# Patient Record
Sex: Male | Born: 1935 | Race: White | Hispanic: No | Marital: Married | Smoking: Former smoker
Health system: Southern US, Community
[De-identification: ages and names within clinical notes are randomized; demographics above are authoritative.]

## PROBLEM LIST (undated history)

## (undated) DIAGNOSIS — M25519 Pain in unspecified shoulder: Secondary | ICD-10-CM

## (undated) DIAGNOSIS — H269 Unspecified cataract: Secondary | ICD-10-CM

## (undated) DIAGNOSIS — A419 Sepsis, unspecified organism: Secondary | ICD-10-CM

## (undated) DIAGNOSIS — M549 Dorsalgia, unspecified: Secondary | ICD-10-CM

## (undated) DIAGNOSIS — E785 Hyperlipidemia, unspecified: Secondary | ICD-10-CM

## (undated) DIAGNOSIS — R2689 Other abnormalities of gait and mobility: Secondary | ICD-10-CM

## (undated) DIAGNOSIS — Z9289 Personal history of other medical treatment: Secondary | ICD-10-CM

## (undated) DIAGNOSIS — I639 Cerebral infarction, unspecified: Secondary | ICD-10-CM

## (undated) DIAGNOSIS — K219 Gastro-esophageal reflux disease without esophagitis: Principal | ICD-10-CM

## (undated) DIAGNOSIS — C61 Malignant neoplasm of prostate: Secondary | ICD-10-CM

## (undated) DIAGNOSIS — M1990 Unspecified osteoarthritis, unspecified site: Secondary | ICD-10-CM

## (undated) DIAGNOSIS — I2510 Atherosclerotic heart disease of native coronary artery without angina pectoris: Secondary | ICD-10-CM

## (undated) DIAGNOSIS — N429 Disorder of prostate, unspecified: Secondary | ICD-10-CM

## (undated) DIAGNOSIS — G8929 Other chronic pain: Secondary | ICD-10-CM

## (undated) DIAGNOSIS — I219 Acute myocardial infarction, unspecified: Secondary | ICD-10-CM

## (undated) DIAGNOSIS — I10 Essential (primary) hypertension: Secondary | ICD-10-CM

## (undated) DIAGNOSIS — F419 Anxiety disorder, unspecified: Secondary | ICD-10-CM

## (undated) DIAGNOSIS — Z125 Encounter for screening for malignant neoplasm of prostate: Secondary | ICD-10-CM

## (undated) DIAGNOSIS — IMO0001 Reserved for inherently not codable concepts without codable children: Secondary | ICD-10-CM

## (undated) DIAGNOSIS — C4490 Unspecified malignant neoplasm of skin, unspecified: Secondary | ICD-10-CM

## (undated) HISTORY — DX: Pain in unspecified shoulder: M25.519

## (undated) HISTORY — DX: Essential (primary) hypertension: I10

## (undated) HISTORY — DX: Anxiety disorder, unspecified: F41.9

## (undated) HISTORY — PX: COLON SURGERY: SHX602

## (undated) HISTORY — DX: Unspecified cataract: H26.9

## (undated) HISTORY — DX: Acute myocardial infarction, unspecified: I21.9

## (undated) HISTORY — DX: Other abnormalities of gait and mobility: R26.89

## (undated) HISTORY — DX: Unspecified osteoarthritis, unspecified site: M19.90

## (undated) HISTORY — DX: Unspecified malignant neoplasm of skin, unspecified: C44.90

## (undated) HISTORY — DX: Gastro-esophageal reflux disease without esophagitis: K21.9

## (undated) HISTORY — DX: Cerebral infarction, unspecified: I63.9

## (undated) HISTORY — DX: Reserved for inherently not codable concepts without codable children: IMO0001

## (undated) HISTORY — PX: HAND SURGERY: SHX662

## (undated) HISTORY — DX: Dorsalgia, unspecified: M54.9

## (undated) HISTORY — DX: Other chronic pain: G89.29

## (undated) HISTORY — DX: Disorder of prostate, unspecified: N42.9

## (undated) HISTORY — PX: APPENDECTOMY: SHX54

## (undated) HISTORY — DX: Personal history of other medical treatment: Z92.89

---

## 2006-03-05 HISTORY — PX: VASECTOMY: SHX75

## 2008-05-31 ENCOUNTER — Ambulatory Visit: Payer: Self-pay | Admitting: Surgery

## 2010-02-27 ENCOUNTER — Emergency Department (HOSPITAL_COMMUNITY)
Admission: EM | Admit: 2010-02-27 | Discharge: 2010-02-28 | Payer: Self-pay | Source: Home / Self Care | Admitting: Emergency Medicine

## 2010-05-15 LAB — DIFFERENTIAL
Eosinophils Absolute: 0.2 10*3/uL (ref 0.0–0.7)
Lymphs Abs: 1.5 10*3/uL (ref 0.7–4.0)
Monocytes Absolute: 0.6 10*3/uL (ref 0.1–1.0)
Monocytes Relative: 6 % (ref 3–12)
Neutrophils Relative %: 78 % — ABNORMAL HIGH (ref 43–77)

## 2010-05-15 LAB — BASIC METABOLIC PANEL
CO2: 26 mEq/L (ref 19–32)
Calcium: 8.9 mg/dL (ref 8.4–10.5)
Chloride: 108 mEq/L (ref 96–112)
GFR calc Af Amer: >60 mL/min (ref >60)
Glucose, Bld: 114 mg/dL — ABNORMAL HIGH (ref 70–99)
Potassium: 4 mEq/L (ref 3.5–5.1)
Sodium: 141 mEq/L (ref 135–145)

## 2010-05-15 LAB — CBC
HCT: 44 % (ref 39.0–52.0)
Hemoglobin: 15.1 g/dL (ref 13.0–17.0)
MCH: 31.6 pg (ref 26.0–34.0)
MCV: 92.1 fL (ref 78.0–100.0)
RBC: 4.78 MIL/uL (ref 4.22–5.81)

## 2010-07-18 NOTE — Assessment & Plan Note (Signed)
OFFICE VISIT   Peter [name], Peter [name]  DOB:  Feb 02, 1934                                       05/31/2008  ZOXWR#:[id number]   REASON FOR VISIT:  Leg pain.   REFERRING PHYSICIAN:  Madlyn Frankel. Charlann Boxer, M.D.   HISTORY:  This is a 77 year old gentleman I am seeing at request of Dr.  Charlann Boxer for evaluation of leg pain.  The patient states that he has  recently had a Life Screening exam which showed abnormal ultrasound on  the right leg.  This was further evaluated at [facility] and he was  found to have abnormal study.  ABIs were not calculated due to  incompressibility.  The patient states that he is able to walk for  approximately 10-15 minutes.  After that, when he starts to go up an  incline, his legs begin to feel heavy and tired.  Just a year ago, he  was able to walk approximately 45 minutes.  His symptoms have been  somewhat improved after a 10-pound weight loss since Christmas.  He  denies any cramping with exercise.  These symptoms are improved by  stopping or walking on a flat surface.  He does not have any rest pain.  He does not have any ulceration.   PAST MEDICAL HISTORY:  Significant for hypertension,  hypercholesterolemia, skin cancer.   REVIEW OF SYSTEMS:  GENERAL:  Negative fevers, chills, weight gain,  weight loss.  CARDIAC:  Negative.  PULMONARY:  Negative.  GI:  Negative.  GU:  Positive for prostate problems and burning with urination.  VASCULAR:  Pain in legs with walking.  NEURO:  Negative.  ORTHO:  Positive arthritis, joint pain, muscle pain.  PSYCH:  Negative.  ENT:  Negative.  HEM:  Negative.   FAMILY HISTORY:  Positive for stroke for carotid endarterectomy in his  mother at age 63.   SOCIAL HISTORY:  He is married with 2 children.  He is retired.  History  of smoking, quit in 1970.  He does not drink alcohol on a regular basis.   MEDICATIONS:  Include finasteride, amitriptyline, Cardura, Ativan,  Flonase, Lipitor, aspirin.   ALLERGIES:  PENICILLIN AND LEVAQUIN.   PHYSICAL EXAMINATION:  Blood pressures is 153/91, pulse 71.  General:  Well-appearing, no distress.  HEENT:  Normocephalic, atraumatic.  Pupils  equal.  Sclerae anicteric.  Neck:  Supple.  No JVD.  No carotid bruits.  Cardiovascular:  Regular rate and rhythm.  No murmurs, rubs, or gallops.  Pulmonary:  Lungs are clear bilaterally.  Abdomen:  Soft, nontender.  Extremities are warm and well-perfused.  Has palpable dorsalis pedis and  posterior tibial pulse on the right, and a palpable dorsalis pedis on  the left.  He has no ulceration.  Skin is without rash.  Neuro:  Cranial  nerves II-XII are grossly intact.  He is nonfocal.   Ultrasound was repeated today which reveals a toe pressure of 146 on the  right and 141 on the left.   ASSESSMENT AND PLAN:  Bilateral leg pain.  The patient's symptoms are  not classic for claudication.  I do think he has a component of arterial  insufficiency.  However, his symptoms certainly are not primarily  arterial in the origin as his biggest complaints are burning in his knee  region.  I do not think he would benefit from any  form of intervention  at this time with regards to his arterial insufficiency.  I would like  to see him back in a year at which time we will repeat his arterial  duplex study.  In the meantime, we will need to focus on aggressive risk  factor control including blood pressure management as well as  cholesterol management.   Jorge Ny, MD  Electronically Signed   VWB/MEDQ  D:  05/31/2008  T:  06/01/2008  Job:  [id number]   cc:   Madlyn Frankel Charlann Boxer, M.D.  Gloriajean Dell. Andrey Campanile, M.D.

## 2011-07-12 DIAGNOSIS — J302 Other seasonal allergic rhinitis: Secondary | ICD-10-CM | POA: Insufficient documentation

## 2011-07-12 DIAGNOSIS — N400 Enlarged prostate without lower urinary tract symptoms: Secondary | ICD-10-CM | POA: Insufficient documentation

## 2011-11-10 ENCOUNTER — Ambulatory Visit (INDEPENDENT_AMBULATORY_CARE_PROVIDER_SITE_OTHER): Payer: Medicare Other | Admitting: Emergency Medicine

## 2011-11-10 ENCOUNTER — Encounter: Payer: Self-pay | Admitting: Emergency Medicine

## 2011-11-10 VITALS — BP 136/80 | HR 88 | Temp 98.2°F | Resp 16 | Ht 71.5 in | Wt 212.0 lb

## 2011-11-10 DIAGNOSIS — R21 Rash and other nonspecific skin eruption: Secondary | ICD-10-CM

## 2011-11-10 DIAGNOSIS — B029 Zoster without complications: Secondary | ICD-10-CM | POA: Insufficient documentation

## 2011-11-10 MED ORDER — VALACYCLOVIR HCL 1 G PO TABS: 1000.0000 mg | ORAL_TABLET | Freq: Three times a day (TID) | ORAL | Status: DC

## 2011-11-10 NOTE — Patient Instructions (Signed)

## 2011-11-10 NOTE — Progress Notes (Signed)
  Subjective:    Patient ID: Peter [name], male    DOB: 1933-11-16, 78 y.o.   MRN: [id number]  HPI A pleasant 78 year old male presents with rash on left flank wrapping around to stomach x 3-4 days; noticed rash first.  Had a reaction to poison ivy 3 weeks to 1 month before this rash.  Poison ivy had cleared up before present rash appeared.    Review of Systems Upcoming bilateral shoulder for arthritis with impingement surgery within the next few months by Dr Prince Rome, taking hydrocodone    Objective:   Physical Exam  Level of T10 there is a vesicular eruption. This extension the flank area across to the anterior abdominal wall to the umbilicus .      Assessment & Plan:  1. Shingles. I advised patient to followup with his family doctor next week so he did discuss his improvement on medication and see if he's developing postherpetic neuralgia.

## 2011-12-14 ENCOUNTER — Other Ambulatory Visit: Payer: Self-pay | Admitting: Family Medicine

## 2011-12-14 DIAGNOSIS — M25512 Pain in left shoulder: Secondary | ICD-10-CM

## 2011-12-18 ENCOUNTER — Ambulatory Visit
Admission: RE | Admit: 2011-12-18 | Discharge: 2011-12-18 | Disposition: A | Discharge status: Home / Self Care | Payer: Medicare Other | Source: Ambulatory Visit | Attending: Family Medicine | Admitting: Family Medicine

## 2011-12-18 DIAGNOSIS — M25512 Pain in left shoulder: Secondary | ICD-10-CM

## 2012-03-22 ENCOUNTER — Ambulatory Visit (INDEPENDENT_AMBULATORY_CARE_PROVIDER_SITE_OTHER): Payer: Medicare Other | Admitting: Family Medicine

## 2012-03-22 VITALS — BP 184/103 | HR 88 | Temp 98.5°F | Resp 16 | Ht 72.0 in | Wt 218.0 lb

## 2012-03-22 DIAGNOSIS — R350 Frequency of micturition: Secondary | ICD-10-CM

## 2012-03-22 DIAGNOSIS — I10 Essential (primary) hypertension: Secondary | ICD-10-CM

## 2012-03-22 LAB — POCT UA - MICROSCOPIC ONLY
Bacteria, U Microscopic: NEGATIVE
Casts, Ur, LPF, POC: NEGATIVE
Crystals, Ur, HPF, POC: NEGATIVE
Epithelial cells, urine per micros: NEGATIVE
Mucus, UA: NEGATIVE
Yeast, UA: NEGATIVE

## 2012-03-22 LAB — POCT URINALYSIS DIPSTICK
Bilirubin, UA: NEGATIVE
Blood, UA: NEGATIVE
Glucose, UA: NEGATIVE
Ketones, UA: NEGATIVE
Leukocytes, UA: NEGATIVE
Nitrite, UA: NEGATIVE
Protein, UA: NEGATIVE
Spec Grav, UA: 1.015
Urobilinogen, UA: 0.2
pH, UA: 5.5

## 2012-03-22 MED ORDER — DOXAZOSIN MESYLATE 8 MG PO TABS: 8.0000 mg | ORAL_TABLET | Freq: Every day | ORAL | Status: DC

## 2012-03-22 NOTE — Patient Instructions (Addendum)
Prostate Problems  The prostate gland is part of the reproductive system of men. A normal prostate is about the size and shape of a walnut. The prostate may grow as a man ages. The gland makes a fluid that is mixed with sperm to make semen. This gland is located in front of the rectum and just below the bladder, where urine is stored. The prostate surrounds the urethra. The urethra is the tube through which urine passes out of the body.  COMMON PROSTATE PROBLEMS  · Prostatitis  · The most common prostate problem in men under 50 is inflammation of the prostate gland (prostatitis).  · This is generally an infection that enters the prostate from the urethra.  · It may be sexually transmitted.  · It could be caused by a slow growing cancer.  · If not caused by cancer, treatment with antibiotics is usually very effective. In some cases, symptoms may be slow to go away and may come back. This condition is commonly called chronic prostatitis.  · Benign Prostatic Hypertrophy (BPH)  · BPH is an enlarged prostate.  · There is no cancer present with this condition.  · The exact cause is not known; but it is one of the most common problems for men over age 50.  · If your caregiver finds BPH, but there are no symptoms or mild symptoms, you may need examinations once or twice a year.  · Prostate cancer  · Symptoms of prostate cancer will vary depending on how big the tumor is and whether it has spread beyond the prostate. If it has spread, your caregiver must find out how far it has spread.  · Prostate cancer is treated by various combinations of surgery, radiation therapy, hormonal therapy, and chemotherapy. Sometimes the prostate cancer is just observed to determine the need for treatment.  · Some men with enlarged prostates have no symptoms at all.  · Symptoms vary a lot. They are usually referred to as "lower urinary tract symptoms" (LUTS).  SYMPTOMS   · Frequent urination.  · Getting up often during the night to  urinate.  · A feeling that you still have a full bladder after passing your urine.  · A weak stream or dribbling after urination.  · Having to push or strain to pass your urine.  · Fever.  · Pain in the low back or groin.  · Blood in the urine.  · Discharge from the penis.  · Weight loss.  · There may be visible enlargement of the bladder.  · In severe cases, you may not be able to empty your bladder and there is severe pain. This is an emergency and requires immediate medical care. If this occurs many times, you can develop permanent damage to the bladder and kidneys.  · Different prostate problems may have similar symptoms. In the early stages, there may be no symptoms at all.  DIAGNOSIS   · If you have urination problems, or a digital rectal exam (DRE) or prostate-specific antigen (PSA) test indicates that you might have a problem, additional tests will be suggested.  · Ask your caregiver if any special preparations are needed before your diagnostic tests.  TREATMENT   · If your caregiver finds BPH and you have bothersome symptoms, medications can be taken by mouth to help.  · If medications are not helpful, surgery may be advised. Different procedures use different methods to heat, destroy, and remove a small amount of the prostate tissue. These methods include   the use of:  · Microwaves.  · High frequency sound waves.  · A laser.  · An electric charge.  · Other surgeries are available. All of these are preceded by appropriate anesthesia.  · The surgeon scrapes away part of the inside of the prostate using a small scope put into the urethra. This reduces the squeezing on the urethra.  · The surgeon makes small cuts in the prostate to reduce the squeezing pressure on the urethra.  · Removal of the entire prostate is carried out through a small incision.  · Removal of the entire prostate through a larger incision may occur in situations where the surgeon feels the other operations are not appropriate.  TYPES OF  TESTS  DRE  · You may be asked to bend over a table or to lie on your side holding your knees close to your chest. Your caregiver advances a gloved, lubricated finger into the rectum and feels the part of the prostate that lies next to it. You may find the DRE slightly uncomfortable, but it is very brief.  · This exam tells the caregiver whether the gland has any bumps, irregularities, or soft or hard spots that require additional tests.  · If a prostate infection is suspected, the caregiver might press the prostate during the DRE to obtain fluid for examination under a microscope.  PSA Blood Test  · The amount of PSA, a protein produced by prostate cells, is often higher in the blood of men who have prostate cancer. However, an elevated level of PSA does not necessarily mean you have cancer.  · Healthy men should no longer receive PSA blood tests as part of routine cancer screening. Consult with your caregiver about prostate cancer screening.  Urinalysis  · This can help find an infection if one is present.  Transrectal Ultrasound and Prostate Biopsy  · If prostate cancer is suspected, your caregiver may recommend a transrectal ultrasound.  · A probe is inserted into the rectum. The probe directs high frequency sound waves at the prostate, and the created image is visible on a monitor screen.  · The image shows the size of the prostate and any abnormalities. This cannot clearly identify tumors.  · To determine whether an abnormality is a tumor, the caregiver may use the probe and the ultrasound images to guide a biopsy needle to the abnormality. Prostate tissue samples will be collected for examination under a microscope. A specialist will look at the tissue samples to see if cancer is present.  MRI and CT Scans  · MRI and CT scans both use computers to create images of internal organs.  · These tests can help identify abnormal structures.  · They cannot show a difference between cancerous tumors and noncancerous  growths.  · If a biopsy confirms cancer, your caregiver might use these imaging techniques to determine how far the cancer has spread. In most cases, these tests are not required. Your caregiver will discuss the need for these tests if he or she feels they are indicated.  Urodynamic Tests  · A weak stream of urine and difficulty emptying the bladder fully may be signs of urine blockage caused by an enlarged prostate that is squeezing the urethra.  · If your problem appears to be related to a blockage, your caregiver may recommend tests that measure bladder pressure and urine flow rate.  · You may be asked to urinate into a device that measures how quickly the urine is flowing. It   will record how many seconds it takes for the peak flow rate to be reached.  · Another test measures post-void residual. This is the amount of urine left in your bladder when you have finished passing urine.  Intravenous Pyelogram (IVP)   · IVP is an X-ray of the urinary tract.  · In this test, a fluid (contrast) is injected into a vein. X-ray pictures are taken at different times to see the progression of contrast through the kidney and ureter.  · The contrast makes the urine visible on the X-ray and shows any narrowing or blockage in the urinary tract.  · This procedure can help show problems in the kidneys, ureters, or bladder that may have come from urine retention or backup.  Abdominal Ultrasound  · For an abdominal ultrasound exam, gel will be applied to your lower abdomen. A handheld device will be moved across the lower abdomen to record a picture of your entire urinary tract.  · An abdominal ultrasound can show damage in the upper urinary tract that comes from urine blockage.  Cystoscopy  · A solution will numb the inside of the penis. A small tube (cystoscope) is inserted through the urethral opening at the tip of the penis.  · The tube allows your caregiver to see the inside of the urethra and bladder.  · The caregiver can  determine the location and amount of the obstruction causing problems.  Finding out the results of your test  Not all test results are available during your visit. If your test results are not back during the visit, make an appointment with your caregiver to find out the results. Do not assume everything is normal if you have not heard from your caregiver or the medical facility. It is important for you to follow up on all of your test results.  HOME CARE INSTRUCTIONS   Home care instructions after diagnostic testing will vary dependent upon the procedure performed.  · Care after urodynamic tests or a cystoscopy:  · You may have mild discomfort for a few hours.  · Drinking two, 8 ounce (240 mL) glasses of water each hour, for 2 hours should help.  · Ask your caregiver whether you can take a warm bath. If not, you may be able to hold a warm, damp washcloth over the urethral opening to relieve the discomfort.  · Care after a prostate biopsy:  · A prostate biopsy may produce pain in the area of the rectum and the area between the rectum and the scrotum (the perineum).  · Only take over-the-counter or prescription medications for pain, discomfort, or fever as directed by your caregiver.  · You may be given antibiotics to prevent an infection.  SEEK MEDICAL CARE IF:  · You have any sign of an infection including pain with urination, chills, or fever.  FOR MORE INFORMATION  American Foundation for Urologic Disease: www.urologyhealth.org  National Cancer Institute (NCI): www.nci.nih.gov  The National Institute of Diabetes and Digestive and Kidney Diseases (NIDDK): www.niddk.nih.gov  The Prostatitis Foundation: www.prostatitis.org  Document Released: 12/17/2006 Document Revised: 05/14/2011 Document Reviewed: 09/03/2008  Exit[employer]® Patient Information ©2013 [employer].

## 2012-03-22 NOTE — Progress Notes (Signed)
79 yo retired man with h/o hypertension, urinary frequency x 2-3 days, associated with constipation  He is afraid of his blood pressure shooting up today.  He has insomnia and dribbling urination.  He took benadryl to control the former.  Sees Dr. Annabell Howells regularly for prostate problems.  No hematuria  Vision  Objective:  Alert, good eye contact, appropriate Chest:  Clear Heart: reg, no murmur Abdomen:  Soft, mildly tender with fullness in the hypogastrium and discomfort with deep palpation Results for orders placed in visit on 03/22/12  POCT URINALYSIS DIPSTICK      Component Value Range   Color, UA yellow     Clarity, UA clear     Glucose, UA neg     Bilirubin, UA neg     Ketones, UA neg     Spec Grav, UA 1.015     Blood, UA neg     pH, UA 5.5     Protein, UA neg     Urobilinogen, UA 0.2     Nitrite, UA neg     Leukocytes, UA Negative    POCT UA - MICROSCOPIC ONLY      Component Value Range   WBC, Ur, HPF, POC 0-2     RBC, urine, microscopic 0-1     Bacteria, U Microscopic neg     Mucus, UA neg     Epithelial cells, urine per micros neg     Crystals, Ur, HPF, POC neg     Casts, Ur, LPF, POC neg     Yeast, UA neg     Assessment:  Urinary outflow obstruction with mild elevation blood pressure. I discussed this with patient and point out that his polyuria as not coming from diabetes. Needs to see Dr. round next week. In the meantime will start him on Cardura.  1. Urinary frequency  POCT urinalysis dipstick, POCT UA - Microscopic Only, doxazosin (CARDURA) 8 MG tablet  2. Hypertension  doxazosin (CARDURA) 8 MG tablet

## 2012-03-26 ENCOUNTER — Encounter: Payer: Self-pay | Admitting: Family Medicine

## 2012-04-30 DIAGNOSIS — K2100 Gastro-esophageal reflux disease with esophagitis, without bleeding: Secondary | ICD-10-CM | POA: Insufficient documentation

## 2012-09-10 ENCOUNTER — Ambulatory Visit (INDEPENDENT_AMBULATORY_CARE_PROVIDER_SITE_OTHER): Payer: Medicare Other | Admitting: Internal Medicine

## 2012-09-10 ENCOUNTER — Encounter: Payer: Self-pay | Admitting: Internal Medicine

## 2012-09-10 VITALS — BP 140/80 | HR 70 | Temp 98.8°F | Ht 71.5 in | Wt 215.0 lb

## 2012-09-10 DIAGNOSIS — R0600 Dyspnea, unspecified: Secondary | ICD-10-CM

## 2012-09-10 DIAGNOSIS — R0609 Other forms of dyspnea: Secondary | ICD-10-CM

## 2012-09-10 DIAGNOSIS — R9389 Abnormal findings on diagnostic imaging of other specified body structures: Secondary | ICD-10-CM

## 2012-09-10 DIAGNOSIS — I10 Essential (primary) hypertension: Secondary | ICD-10-CM

## 2012-09-10 DIAGNOSIS — R0989 Other specified symptoms and signs involving the circulatory and respiratory systems: Secondary | ICD-10-CM

## 2012-09-10 MED ORDER — OMEPRAZOLE MAGNESIUM 20 MG PO TBEC: DELAYED_RELEASE_TABLET | ORAL | Status: DC

## 2012-09-10 NOTE — Progress Notes (Signed)
  Subjective:    Patient ID: Peter [name], male    DOB: 25-Sep-1933  MRN: [id number]  HPI  43 yowm quit smoking 1974 with "bad allergies" with cough neg allergy testing spring > fall x 2004 better p gerd rx prn  referred 09/10/2012 by Dr Cyndia Bent for new onset sob.    09/10/2012 1st pulmonary eval cc variable sob at rest assoc with consistent with  Doe  like mowing the yard  X one year or up hills, does ok flat. Much worse x 6 months and note GERD rx stopped 01/2012. Symptoms do not correlate directly with ACEi use per outpt records reviewed but may have worsened while on them.     He does not use inhalers > the doe gets better with rest, the sob at rest gets better in 2--30 min s rx and comes on suddenly s warning or provocation   No obvious pattern to  daytime variabilty in terms of the component that is  sob at rest or assoc chronic cough or cp or chest tightness, subjective wheeze overt sinus or hb symptoms. No unusual exp hx or h/o childhood pna/ asthma or knowledge of premature birth.   Sleeping ok without nocturnal  or early am exacerbation  of respiratory  c/o's or need for noct saba. Also denies any obvious fluctuation of symptoms with weather or environmental changes or other aggravating or alleviating factors except as outlined above    Review of Systems  Constitutional: Negative for fever, chills, activity change, appetite change and unexpected weight change.  HENT: Negative for congestion, sore throat, rhinorrhea, sneezing, trouble swallowing, dental problem, voice change and postnasal drip.   Eyes: Negative for visual disturbance.  Respiratory: Positive for shortness of breath. Negative for cough and choking.   Cardiovascular: Negative for chest pain and leg swelling.  Gastrointestinal: Positive for abdominal pain. Negative for nausea and vomiting.  Genitourinary: Negative for difficulty urinating.  Musculoskeletal: Positive for arthralgias.  Skin: Negative for rash.   Psychiatric/Behavioral: Negative for behavioral problems and confusion.       Objective:   Physical Exam  Wt Readings from Last 3 Encounters:  09/10/12 215 lb (97.523 kg)  03/22/12 218 lb (98.884 kg)  11/10/11 212 lb (96.163 kg)     amb slt hoarse wm nad  HEENT mild turbinate edema.  Oropharynx no thrush or excess pnd or cobblestoning.  No JVD or cervical adenopathy. Mild accessory muscle hypertrophy. Trachea midline, nl thryroid. Chest was hyperinflated by percussion with diminished breath sounds and moderate increased exp time without wheeze. Hoover sign positive at mid inspiration. Regular rate and rhythm without murmur gallop or rub or increase P2 or edema.  Abd: pot belly no hsm, nl excursion. Ext warm without cyanosis or clubbing.       Cxr/ ct  08/03/12  Elevated R HD, tiny effusions bilaterally       Assessment & Plan:

## 2012-09-10 NOTE — Patient Instructions (Addendum)
Stop lisinopril and substitute an ARB of Dr Gevena Barre choice for the next week   Take prilosec 20 mg Take 30-60 min before first meal of the day   GERD (REFLUX)  is an extremely common cause of respiratory symptoms, many times with no significant heartburn at all.    It can be treated with medication, but also with lifestyle changes including avoidance of late meals, excessive alcohol, smoking cessation, and avoid fatty foods, chocolate, peppermint, colas, red wine, and acidic juices such as orange juice.  NO MINT OR MENTHOL PRODUCTS SO NO COUGH DROPS  USE SUGARLESS CANDY INSTEAD (jolley ranchers or Stover's)  NO OIL BASED VITAMINS - use powdered substitutes.    Please schedule a follow up office visit in 6 weeks, call sooner if needed with pfts Late add There is a typo in the first line which whould read:  See Dr Leeann Must next week for change to arb until return here  Also need CT chest from [place] for f/u

## 2012-09-14 DIAGNOSIS — R9389 Abnormal findings on diagnostic imaging of other specified body structures: Secondary | ICD-10-CM | POA: Insufficient documentation

## 2012-09-14 DIAGNOSIS — R0600 Dyspnea, unspecified: Secondary | ICD-10-CM | POA: Insufficient documentation

## 2012-09-14 DIAGNOSIS — I10 Essential (primary) hypertension: Secondary | ICD-10-CM | POA: Insufficient documentation

## 2012-09-14 NOTE — Assessment & Plan Note (Addendum)
  When respiratory symptoms begin or become refractory well after a patient reports complete smoking cessation,  Especially when this wasn't the case while they were smoking, a red flag is raised based on the work of Dr Primitivo Gauze which states:  if you quit smoking when your best day FEV1 is still well preserved it is highly unlikely you will progress to severe disease.  That is to say, once the smoking stops,  the symptoms should not suddenly erupt or markedly worsen.  If so, the differential diagnosis should include  obesity/deconditioning,  LPR/Reflux/Aspiration syndromes,  occult CHF, or  especially side effect of medications commonly used in this population, especially acei, esp in pts with LPR. (see HBP)  He clearly has some sob but symptoms worsened off gerd rx so needs to restart gerd rx and in the short run hold acei until we sort out the mechanism for his sob with return for PFT's

## 2012-09-14 NOTE — Assessment & Plan Note (Signed)
ACE inhibitors are problematic in  pts with airway complaints because  even experienced pulmonologists can't always distinguish ace effects from copd/asthma/pnds/ allergies etc.  By themselves they don't actually cause a problem, much like oxygen can't by itself start a fire, but they certainly serve as a powerful catalyst or enhancer for any "fire"  or inflammatory process in the upper airway, be it caused by an ET  tube or more commonly reflux (especially in the obese or pts with known GERD or who are on biphoshonates) or URI's, due to interference with bradykinin clearance.  The effects of acei on bradykinin levels occurs in 100% of pt's on acei (unless they surreptitiously stop the med!) but the classic cough is only reported in 5%.  This leaves 95% of pts on acei's  with a variety of syndromes including no identifiable symptom in most  vs non-specific symptoms that wax and wane depending on what other insult is occuring at the level of the upper airway like GERD in this pt  Will defer choice of replacement to Dr Leeann Must

## 2012-09-14 NOTE — Assessment & Plan Note (Signed)
Records not available  > requested to review.  Clearly his symptoms of sob at rest are "macroscopic" and if the problems are only seen on CT they are microscopic by comparison so will revisit this issue next ov

## 2012-09-17 ENCOUNTER — Encounter: Payer: Self-pay | Admitting: Internal Medicine

## 2012-10-23 ENCOUNTER — Ambulatory Visit (INDEPENDENT_AMBULATORY_CARE_PROVIDER_SITE_OTHER): Payer: Medicare Other | Admitting: Internal Medicine

## 2012-10-23 ENCOUNTER — Encounter: Payer: Self-pay | Admitting: Internal Medicine

## 2012-10-23 VITALS — BP 148/80 | HR 79 | Temp 98.7°F | Ht 72.0 in | Wt 203.0 lb

## 2012-10-23 DIAGNOSIS — R0609 Other forms of dyspnea: Secondary | ICD-10-CM

## 2012-10-23 DIAGNOSIS — R0600 Dyspnea, unspecified: Secondary | ICD-10-CM

## 2012-10-23 DIAGNOSIS — I10 Essential (primary) hypertension: Secondary | ICD-10-CM

## 2012-10-23 LAB — PULMONARY FUNCTION TEST

## 2012-10-23 NOTE — Progress Notes (Signed)
Subjective:    Patient ID: Peter [name], male    DOB: December 26, 1933  MRN: [id number]    Brief patient profile:  78 yowm quit smoking 1974 with "bad allergies" with cough neg allergy testing spring > fall x 2004 better p gerd rx prn  referred 09/10/2012 by Dr Cyndia Bent for new onset sob.     HPI 09/10/2012 1st pulmonary eval cc variable sob at rest assoc with consistent with  Peter [name]  like mowing the yard  X one year or up hills, does ok flat. Much worse x 6 months and note GERD rx stopped 01/2012. Symptoms do not correlate directly with ACEi use per outpt records reviewed but may have worsened while on them.     He does not use inhalers > the Peter [name] gets better with rest, the sob at rest gets better in 2--30 min s rx and comes on suddenly s warning or provocation  rec Stop lisinopril and substitute an ARB of Dr Gevena Barre choice for the next week  Take prilosec 20 mg Take 30-60 min before first meal of the day  GERD diet  10/23/2012 f/u ov/Peter [name] off acei since 7/10  Chief Complaint  Patient presents with  . Follow-up    Pt had PFT done today. States breathing has improved since last OV.     no limiting sob or cough or need for any inhalers   No obvious  variabilty or assoc chronic cough or cp or chest tightness, subjective wheeze overt sinus or hb symptoms. No unusual exp hx or h/o childhood pna/ asthma or knowledge of premature birth.   Sleeping ok without nocturnal  or early am exacerbation  of respiratory  c/o's or need for noct saba. Also denies any obvious fluctuation of symptoms with weather or environmental changes or other aggravating or alleviating factors except as outlined above   Current Medications, Allergies, Past Medical History, Past Surgical History, Family History, and Social History were reviewed in [employer] record.  ROS  The following are not active complaints unless bolded sore throat, dysphagia, dental problems, itching, sneezing,  nasal congestion  or excess/ purulent secretions, ear ache,   fever, chills, sweats, unintended wt loss, pleuritic or exertional cp, hemoptysis,  orthopnea pnd or leg swelling, presyncope, palpitations, heartburn, abdominal pain, anorexia, nausea, vomiting, diarrhea  or change in bowel or urinary habits, change in stools or urine, dysuria,hematuria,  rash, arthralgias, visual complaints, headache, numbness weakness or ataxia or problems with walking or coordination,  change in mood/affect or memory.             Objective:   Physical Exam  10/23/2012        203  Wt Readings from Last 3 Encounters:  09/10/12 215 lb (97.523 kg)  03/22/12 218 lb (98.884 kg)  11/10/11 212 lb (96.163 kg)     amb   wm nad  HEENT: nl dentition, turbinates, and orophanx. Nl external ear canals without cough reflex   NECK :  without JVD/Nodes/TM/ nl carotid upstrokes bilaterally   LUNGS: no acc muscle use, clear to A and P bilaterally without cough on insp or exp maneuvers   CV:  RRR  no s3 or murmur or increase in P2, no edema   ABD:  soft and nontender with nl excursion in the supine position. No bruits or organomegaly, bowel sounds nl  MS:  warm without deformities, calf tenderness, cyanosis or clubbing  SKIN: warm and dry without lesions    NEURO:  alert, approp, no deficits  .       Cxr/ ct  08/03/12  Elevated R HD, tiny effusions bilaterally       Assessment & Plan:   Outpatient Encounter Prescriptions as of 10/23/2012  Medication Sig Dispense Refill  . aspirin 81 MG tablet Take 81 mg by mouth daily.      . finasteride (PROPECIA) 1 MG tablet Take 1 mg by mouth daily.      Marland Kitchen HYDROcodone-acetaminophen (NORCO/VICODIN) 5-325 MG per tablet Take 1 tablet by mouth every 6 (six) hours as needed.      Marland Kitchen LOSARTAN POTASSIUM PO Take 1 tablet by mouth daily.      Marland Kitchen omeprazole (PRILOSEC OTC) 20 MG tablet Take 30-60 min before first meal of the day      . [DISCONTINUED] pravastatin (PRAVACHOL) 20 MG tablet Take 20 mg  by mouth daily.       No facility-administered encounter medications on file as of 10/23/2012.

## 2012-10-23 NOTE — Progress Notes (Signed)
PFT done today. 

## 2012-10-23 NOTE — Patient Instructions (Addendum)
Your lung function is perfectly normal  Follow up here is as needed

## 2012-10-26 NOTE — Assessment & Plan Note (Signed)
-   trial off acei rec 09/10/12 > resolved - PFT's  10/23/2012 wnl   Completely asymptomatic off acei > no  Pulmonary f/u needed .

## 2012-10-26 NOTE — Assessment & Plan Note (Addendum)
Given the convincing improvement to point of resolution of all symptoms off acei would not re-challenge at this point  Defer to choice of longterm rx to primary care but for now continue arb.

## 2012-11-04 ENCOUNTER — Encounter: Payer: Self-pay | Admitting: Internal Medicine

## 2012-11-14 ENCOUNTER — Ambulatory Visit (INDEPENDENT_AMBULATORY_CARE_PROVIDER_SITE_OTHER): Payer: Medicare Other | Admitting: Neurology

## 2012-11-14 ENCOUNTER — Encounter: Payer: Self-pay | Admitting: Neurology

## 2012-11-14 VITALS — BP 138/90 | HR 70 | Temp 97.6°F | Ht 71.0 in | Wt 199.0 lb

## 2012-11-14 DIAGNOSIS — G8929 Other chronic pain: Secondary | ICD-10-CM | POA: Insufficient documentation

## 2012-11-14 DIAGNOSIS — M25519 Pain in unspecified shoulder: Secondary | ICD-10-CM

## 2012-11-14 DIAGNOSIS — R2689 Other abnormalities of gait and mobility: Secondary | ICD-10-CM

## 2012-11-14 DIAGNOSIS — R29818 Other symptoms and signs involving the nervous system: Secondary | ICD-10-CM

## 2012-11-14 DIAGNOSIS — M549 Dorsalgia, unspecified: Secondary | ICD-10-CM

## 2012-11-14 DIAGNOSIS — R413 Other amnesia: Secondary | ICD-10-CM

## 2012-11-14 DIAGNOSIS — IMO0001 Reserved for inherently not codable concepts without codable children: Secondary | ICD-10-CM

## 2012-11-14 NOTE — Progress Notes (Signed)
[facility]  PATIENT: Peter [name] DOB: March 24, 1933  HISTORICAL  Record is a 79 years old right-handed Caucasian male, referred by his primary care physician Dr. Cyndia Bent for evaluation of constellation of complaints,  mild balance issue, and cognitive impairment  He had past medical history of hypertension, there was frequent change of his blood pressure medications, a few months ago, he complains of lightheadedness, transient, when he first got up from seated position, improved after standing still for a while, his blood pressures medication was changed from Losartan to Diovan in July 2014, his symptoms have much  Improved afterwards, he denies numbness, weakness,  He has chronic  neck, low back pain  bilateral shoulder pain, is taking oxycodone as needed, he also has anxiety disorder, was taking Xanax as needed  His farternal twin Brother died of Doreatha Martin disease few years ago, he worries that the above symptoms might represent motor neuron disease, he also complains many years history of intermittent bilateral upper extremity paresthesia, getting worse over the past few weeks,  wakeup in the morning time, he has to shake his hands to make the sensation come back  He also complains of mild memory trouble, he had college degree, was retired Art gallery manager at age 39,  in recent few months, he noticed mild short-term memory trouble, easily distracted,  forget people's name, and opened the cabinet door instead of the planned refrigerator door, but he is still highly functional, driving without getting lost. There was no family history of Alzheimer's disease.  REVIEW OF SYSTEMS: Full 14 system review of systems performed and notable only for weight loss, fatigue, urination problems, joints pain, memory loss, confusion, numbness, weakness, dizziness, sleepiness, anxiety, not enough sleep  ALLERGIES: Allergies  Allergen Reactions  . Levaquin [Levofloxacin In D5w]  Other (See Comments)    Joint swelling  . Lipitor [Atorvastatin] Nausea And Vomiting  . Niaspan [Niacin] Other (See Comments)    Joint swelling  . Penicillins Swelling  . Sulfa Antibiotics Itching  . Vytorin [Ezetimibe-Simvastatin] Nausea And Vomiting  . Clindamycin/Lincomycin Rash  . Zithromax [Azithromycin] Rash    HOME MEDICATIONS: Outpatient Prescriptions Prior to Visit  Medication Sig Dispense Refill  . aspirin 81 MG tablet Take 81 mg by mouth daily.      Marland Kitchen HYDROcodone-acetaminophen (NORCO/VICODIN) 5-325 MG per tablet Take 1 tablet by mouth every 6 (six) hours as needed.      Marland Kitchen omeprazole (PRILOSEC OTC) 20 MG tablet Take 30-60 min before first meal of the day      . finasteride (PROPECIA) 1 MG tablet Take 1 mg by mouth daily.      Marland Kitchen LOSARTAN POTASSIUM PO Take 1 tablet by mouth daily.       No facility-administered medications prior to visit.    PAST MEDICAL HISTORY: Past Medical History  Diagnosis Date  . Arthritis   . Cataract   . Prostate disease   . Skin cancer   . Hypertension   . Shoulder pain   . Reflux   . Chronic back pain   . Loss of balance   . High cholesterol   . Anxiety     PAST SURGICAL HISTORY: Past Surgical History  Procedure Laterality Date  . Vasectomy    . Appendectomy    . Colon surgery      FAMILY HISTORY: Family History  Problem Relation Age of Onset  . Stroke Mother   . Prostate cancer Father   . Breast cancer Daughter   .  ALS Brother     SOCIAL HISTORY:  History   Social History  . Marital Status: Married    Spouse Name: Okey Regal    Number of Children: 2  . Years of Education: college   Occupational History  . Retired- Health and safety inspector work     Social History Main Topics  . Smoking status: Former Smoker -- 1.00 packs/day for 10 years    Types: Cigarettes    Quit date: 03/05/1972  . Smokeless tobacco: Never Used  . Alcohol Use: No  . Drug Use: No  . Sexual Activity: Yes    Social History Narrative   Patient lives at home  with his spouse.   Caffeine Use: 2 cups of coffee daily    PHYSICAL EXAM    Filed Vitals:   11/14/12 0801 11/14/12 0804  BP: 141/84 138/90  Pulse: 75 70  Temp: 97.6 F (36.4 C)   TempSrc: Oral   Height: 5\' 11"  (1.803 m)   Weight: 199 lb (90.266 kg)    Blood pressure sitting down 160/90, standing up 160/90  Body mass index is 27.77 kg/(m^2).   Generalized: In no acute distress  Neck: Supple, no carotid bruits   Cardiac: Regular rate rhythm  Pulmonary: Clear to auscultation bilaterally  Musculoskeletal: No deformity  Neurological examination  Mentation: Alert oriented to time, place, history taking, and causual conversation  Cranial nerve [name]: Pupils were equal round reactive to light extraocular movements were full, visual field were full on confrontational test. facial sensation and strength were normal. hearing was intact to finger rubbing bilaterally. Uvula tongue midline.  head turning and shoulder shrug and were normal and symmetric.Tongue protrusion into cheek strength was normal.  Motor: normal tone, bulk and strength.  Sensory: Intact to fine touch, pinprick, preserved vibratory sensation, and proprioception at toes.  Coordination: Normal finger to nose, heel-to-shin bilaterally there was no truncal ataxia  Gait: Rising up from seated position without assistance, normal stance, without trunk ataxia, moderate stride, good arm swing, smooth turning, able to perform tiptoe, and heel walking without difficulty.   Romberg signs: Negative  Deep tendon reflexes: Brachioradialis 2/2, biceps 2/2, triceps 2/2, patellar 2/2, Achilles 1/1, plantar responses were flexor bilaterally.   DIAGNOSTIC DATA (LABS, IMAGING, TESTING) - I reviewed patient records, labs, notes, testing and imaging myself where available.  Lab Results  Component Value Date   WBC 10.8* 02/27/2010   HGB 15.1 02/27/2010   HCT 44.0 02/27/2010   MCV 92.1 02/27/2010   PLT 229 02/27/2010        Component Value Date/Time   NA 141 02/27/2010 2338   K 4.0 02/27/2010 2338   CL 108 02/27/2010 2338   CO2 26 02/27/2010 2338   GLUCOSE 114* 02/27/2010 2338   BUN 13 02/27/2010 2338   CREATININE 0.96 02/27/2010 2338   CALCIUM 8.9 02/27/2010 2338   GFRNONAA >60 02/27/2010 2338   GFRAA  Value: >60        The eGFR has been calculated using the MDRD equation. This calculation has not been validated in all clinical situations. eGFR's persistently <60 mL/min signify possible Chronic Kidney Disease. 02/27/2010 2338   ASSESSMENT AND PLAN  Peter [name] is a 79 years old right-handed Caucasian male, with past medical history of hypertension, anxiety disorder, now presenting with mild memory trouble, Mini-Mental Status Examination is 30 out of 30, he also complains of mild lightheadedness when getting up from seated position, now has improved with change of his blood pressure medications,  1,  Mild cognitive  impairment, we will initiate evaluation with MRI of the brain 2.  Laboratory evaluation 3,  His complains of lightheadedness, mild unsteady gait, is most related to his blood pressure change with positional change 4.  EMG nerve conduction study for his complains of bilateral hands paresthesia, suggestive of carpal tunnel syndromes,   Levert Feinstein, M.D. Ph.D.  [facility] [address], [address] [place], [place] [address] [phone]

## 2012-11-15 LAB — THYROID PANEL WITH TSH
T3 Uptake Ratio: 29 % (ref 24–39)
T4, Total: 8.9 ug/dL (ref 4.5–12.0)

## 2012-11-16 NOTE — Progress Notes (Signed)
Quick Note:  Please call patient for normal lab. ______

## 2012-11-17 ENCOUNTER — Ambulatory Visit
Admission: RE | Admit: 2012-11-17 | Discharge: 2012-11-17 | Disposition: A | Discharge status: Home / Self Care | Payer: Medicare Other | Source: Ambulatory Visit | Attending: Neurology | Admitting: Neurology

## 2012-11-17 DIAGNOSIS — R2689 Other abnormalities of gait and mobility: Secondary | ICD-10-CM

## 2012-11-17 DIAGNOSIS — R413 Other amnesia: Secondary | ICD-10-CM

## 2012-11-17 DIAGNOSIS — M25519 Pain in unspecified shoulder: Secondary | ICD-10-CM

## 2012-11-17 DIAGNOSIS — G8929 Other chronic pain: Secondary | ICD-10-CM

## 2012-11-17 NOTE — Progress Notes (Signed)
Quick Note:  Please call patient, MRI brain showed mild age related changes, no acute lesions. ______ 

## 2012-11-17 NOTE — Progress Notes (Signed)
Quick Note:  I called pt and relayed the lab results. He had questions about getting the results of MRI he had today. I told him that we would get these to our MD's who read the next day and from there they will forward to Dr. Terrace Arabia and then call him with results. He stated they could LM and call him back. ______

## 2012-11-19 ENCOUNTER — Telehealth: Payer: Self-pay | Admitting: Neurology

## 2012-11-19 NOTE — Progress Notes (Signed)
Quick Note:  I called and gave the results of the MRI to pt. He has multiple questions and wants to know about r/o ALZ, ALS. Prognosis? EMG/NCS scheduled 9-29/14 but wanted to speak to Dr. Terrace Arabia before then. Please call him [phone]. ______

## 2012-11-19 NOTE — Telephone Encounter (Signed)
Please call patient to address his questions, I can go over information in detail when he comes back for EMG/NCS in Sept 29th. The test will rule out ALS.

## 2012-11-19 NOTE — Telephone Encounter (Signed)
Message copied by Levert Feinstein on Wed Nov 19, 2012  3:43 PM ------      Message from: [place], [place]      Created: Wed Nov 19, 2012  9:15 AM       I called and gave the results of the MRI to pt.  He has multiple questions and wants to know about r/o ALZ, ALS.  Prognosis?  EMG/NCS scheduled 9-29/14 but wanted to speak to Dr. Terrace Arabia before then.  Please call him [phone]. ------

## 2012-11-19 NOTE — Progress Notes (Signed)
Quick Note:  Please call patient, mild age related changes on MRI brain, no acute lesions. ______

## 2012-11-21 NOTE — Telephone Encounter (Signed)
I spoke to him and relayed that the doctor would go over in detail all his questions.  He also mentioned that he has numbness in his legs and wanted to know if the NCV test could check for that in addition to his hands.

## 2012-11-22 ENCOUNTER — Other Ambulatory Visit: Payer: Medicare Other

## 2012-12-01 ENCOUNTER — Encounter (INDEPENDENT_AMBULATORY_CARE_PROVIDER_SITE_OTHER): Payer: Self-pay

## 2012-12-01 ENCOUNTER — Ambulatory Visit (INDEPENDENT_AMBULATORY_CARE_PROVIDER_SITE_OTHER): Payer: Medicare Other | Admitting: Neurology

## 2012-12-01 DIAGNOSIS — R209 Unspecified disturbances of skin sensation: Secondary | ICD-10-CM

## 2012-12-01 DIAGNOSIS — G5600 Carpal tunnel syndrome, unspecified upper limb: Secondary | ICD-10-CM

## 2012-12-01 DIAGNOSIS — M25519 Pain in unspecified shoulder: Secondary | ICD-10-CM

## 2012-12-01 DIAGNOSIS — G8929 Other chronic pain: Secondary | ICD-10-CM

## 2012-12-01 DIAGNOSIS — R2689 Other abnormalities of gait and mobility: Secondary | ICD-10-CM

## 2012-12-01 NOTE — Procedures (Signed)
    [facility]  NCS (NERVE CONDUCTION STUDY) WITH EMG (ELECTROMYOGRAPHY) REPORT   STUDY DATE: 12/01/2012 PATIENT NAME: Peter [name] DOB: [birth year] MRN: [id number]    TECHNOLOGIST: Gearldine Shown ELECTROMYOGRAPHER: Levert Feinstein M.D.  CLINICAL INFORMATION:  79 yo RH male with bilateral finger tips paresthesia, generalized weakness.   FINDINGS: NERVE CONDUCTION STUDY:    Bilateral ulnar sensory and motor responses were normal. Bilateral median sensory responses showed mildly prolonged peak latency, with normal SNAP amplitude.  Bilateral median motor responses showed mildly prolonged distal latency, with normal CMAP amplitude, conduction velocity, F wave latency.  NEEDLE ELECTROMYOGRAPHY: Selected needle examination was performed at right lower extremity muscles and right lumbosacral paraspinal muscles.  Needle exam of right tibialis anterior, tibialis posterior, medial gastrocnemius, vastus lateralis, biceps femoris long head was normal.  There was no spontaneous activities at right lumbar paraspinal muscles, such as right L4, L5, S1.  IMPRESSION:  This is a mild abnormal study.  There is electrodiagnosis of bilateral median neuropathy across the wrist, most consistent with mild to moderate bilateral carpal tunnel syndromes.  There is no evidence of right cervical radiculopathy.   INTERPRETING PHYSICIAN:   Levert Feinstein M.D. Ph.D. [facility] [address] [place], [place] [address] [phone]

## 2013-03-11 ENCOUNTER — Telehealth: Payer: Self-pay | Admitting: Neurology

## 2013-03-11 NOTE — Telephone Encounter (Signed)
Please advise 

## 2013-03-11 NOTE — Telephone Encounter (Signed)
PT called wanting to schedule appt with Dr Krista Blue.  He states he has been having mild to moderate headaches everyday for the last 3 months.  He thought it might be related to changes in his BP meds, but is not sure.  Please call.  Thank you

## 2013-03-11 NOTE — Telephone Encounter (Signed)
I called pt and made appt for him as new consult headaches.   Has been having problems for the last 3 mo.   Last time seen he had problems with balance, dizziness and Bp medication changed.  Sx went away.  He then has had changes 2 more times and is still having problems.   Carola Rhine, NP with Dr. Vanetta Mulders office did not think related to Bp med at this time, recommended to call us.   I made new consult appt 03-24-13 at 0900 with Dr. Krista Blue for new problem headaches. This is 2 wks from starting new Bp med, if headaches go away then will call and cancel, other wise will come to see [place].  Did note that stress caused increase of headache.  Frontal (mild to moderate, daily).

## 2013-03-24 ENCOUNTER — Encounter (INDEPENDENT_AMBULATORY_CARE_PROVIDER_SITE_OTHER): Payer: Self-pay

## 2013-03-24 ENCOUNTER — Ambulatory Visit (INDEPENDENT_AMBULATORY_CARE_PROVIDER_SITE_OTHER): Payer: Medicare Other | Admitting: Neurology

## 2013-03-24 ENCOUNTER — Encounter: Payer: Self-pay | Admitting: Neurology

## 2013-03-24 VITALS — BP 141/91 | HR 70 | Ht 72.0 in | Wt 213.0 lb

## 2013-03-24 DIAGNOSIS — R29818 Other symptoms and signs involving the nervous system: Secondary | ICD-10-CM

## 2013-03-24 DIAGNOSIS — G5600 Carpal tunnel syndrome, unspecified upper limb: Secondary | ICD-10-CM

## 2013-03-24 DIAGNOSIS — I10 Essential (primary) hypertension: Secondary | ICD-10-CM

## 2013-03-24 DIAGNOSIS — R2689 Other abnormalities of gait and mobility: Secondary | ICD-10-CM

## 2013-03-24 MED ORDER — CELECOXIB 100 MG PO CAPS: 100.0000 mg | ORAL_CAPSULE | Freq: Two times a day (BID) | ORAL | Status: DC

## 2013-03-24 NOTE — Progress Notes (Signed)
[facility]  PATIENT: Peter [name] DOB: 1933/11/15  HISTORICAL  Record is a 80 years old right-handed Caucasian male, referred by his primary care physician Dr. Melford [name] for evaluation of constellation of complaints,  mild balance issue, and cognitive impairment  He had past medical history of hypertension, there was frequent change of his blood pressure medications, a few months ago, he complains of lightheadedness, transient, when he first got up from seated position, improved after standing still for a while, his blood pressures medication was changed from Peter [name] in July 2014, his symptoms have much  Improved afterwards, he denies numbness, weakness,  He has chronic  neck, low back pain  bilateral shoulder pain, is taking oxycodone as needed, he also has anxiety disorder, was taking Peter [name] as needed  His farternal twin Brother died of Peter [name] disease few years ago, he worries that the above symptoms might represent motor neuron disease, he also complains many years history of intermittent bilateral upper extremity paresthesia, getting worse over the past few weeks,  wakeup in the morning time,  has to shake his hands to make the sensation come back  He also complains of mild memory trouble, he had college degree, was retired Nutritional therapist at age 44,  in recent few months, he noticed mild short-term memory trouble, easily distracted,  forget people's name, and opened the cabinet door instead of the planned refrigerator door, but he is still highly functional, driving without getting lost. There was no family history of Peter [name] disease. MRI of the brain was essentially normal, electrodiagnostic study has demonstrated mild to moderate bilateral carpal tunnel syndromes, laboratory showed normal ESR C-reactive protein, and TSH  UPDATE Jan 20th 2015: He is overall better, no longer has memory loss, balance issuse He is doing exercise, he has mild  neck pain, the most bothersome symptoms is his vertex area headache, mild to moderate, intermittent, only lasting for few second.  Stress triggers it. He is taking hydrocodone/APAP, meloxicam for low back pain, now he is only taking Peter [name] daily 2 tabs tid, which has been helpful.   REVIEW OF SYSTEMS: Full 14 system review of systems performed and notable only for weight loss, fatigue, urination problems, joints pain, memory loss, confusion, numbness, weakness, dizziness, sleepiness, anxiety, not enough sleep  ALLERGIES: Allergies  Allergen Reactions  . Levaquin [Levofloxacin In D5w] Other (See Comments)    Joint swelling  . Lipitor [Atorvastatin] Nausea And Vomiting  . Niaspan [Niacin] Other (See Comments)    Joint swelling  . Penicillins Swelling  . Sulfa Antibiotics Itching  . Vytorin [Ezetimibe-Simvastatin] Nausea And Vomiting  . Clindamycin/Lincomycin Rash  . Zithromax [Azithromycin] Rash    HOME MEDICATIONS: Outpatient Prescriptions Prior to Visit  Medication Sig Dispense Refill  . aspirin 81 MG tablet Take 81 mg by mouth daily.      . finasteride (PROSCAR) 5 MG tablet Take 5 mg by mouth daily.      Marland Kitchen HYDROcodone-acetaminophen (NORCO/VICODIN) 5-325 MG per tablet Take 1 tablet by mouth every 6 (six) hours as needed.      Marland Kitchen omeprazole (PRILOSEC OTC) 20 MG tablet Take 30-60 min before first meal of the day      . valsartan (Peter [name]) 40 MG tablet Take 40 mg by mouth daily.       No facility-administered medications prior to visit.    PAST MEDICAL HISTORY: Past Medical History  Diagnosis Date  . Arthritis   . Cataract   . Prostate  disease   . Skin cancer   . Hypertension   . Shoulder pain   . Reflux   . Chronic back pain   . Loss of balance   . High cholesterol   . Anxiety     PAST SURGICAL HISTORY: Past Surgical History  Procedure Laterality Date  . Vasectomy    . Appendectomy    . Colon surgery    . Hand surgery Left     FAMILY HISTORY: Family History   Problem Relation Age of Onset  . Stroke Mother   . Prostate cancer Father   . Breast cancer Daughter   . ALS Brother     SOCIAL HISTORY:  History   Social History  . Marital Status: Married    Spouse Name: Peter [name]    Number of Children: 2  . Years of Education: college   Occupational History  . Retired- Network engineer work     Social History Main Topics  . Smoking status: Former Smoker -- 1.00 packs/day for 10 years    Types: Cigarettes    Quit date: 03/05/1972  . Smokeless tobacco: Never Used  . Alcohol Use: No  . Drug Use: No  . Sexual Activity: Yes    Social History Narrative   Patient lives at home with his spouse.   Caffeine Use: 2 cups of coffee daily    PHYSICAL EXAM    Filed Vitals:   03/24/13 0851  BP: 141/91  Pulse: 70  Height: 6' (1.829 m)  Weight: 213 lb (96.616 kg)   Blood pressure sitting down 160/90, standing up 160/90  Body mass index is 28.88 kg/(m^2).   Generalized: In no acute distress  Neck: Supple, no carotid bruits   Cardiac: Regular rate rhythm  Pulmonary: Clear to auscultation bilaterally  Musculoskeletal: No deformity  Neurological examination  Mentation: Alert oriented to time, place, history taking, and causual conversation  Cranial nerve [name]: Pupils were equal round reactive to light extraocular movements were full, visual field were full on confrontational test. facial sensation and strength were normal. hearing was intact to finger rubbing bilaterally. Uvula tongue midline.  head turning and shoulder shrug and were normal and symmetric.Tongue protrusion into cheek strength was normal.  Motor: normal tone, bulk and strength.  Sensory: Intact to fine touch, pinprick, preserved vibratory sensation, and proprioception at toes.  Coordination: Normal finger to nose, heel-to-shin bilaterally there was no truncal ataxia  Gait: Mildly stiff cautious gait  Romberg signs: Negative  Deep tendon reflexes: Brachioradialis 2/2, biceps  2/2, triceps 2/2, patellar 2/2, Achilles 1/1, plantar responses were flexor bilaterally.   DIAGNOSTIC DATA (LABS, IMAGING, TESTING) - I reviewed patient records, labs, notes, testing and imaging myself where available.  Lab Results  Component Value Date   WBC 10.8* 02/27/2010   HGB 15.1 02/27/2010   HCT 44.0 02/27/2010   MCV 92.1 02/27/2010   PLT 229 02/27/2010      Component Value Date/Time   NA 141 02/27/2010 2338   K 4.0 02/27/2010 2338   CL 108 02/27/2010 2338   CO2 26 02/27/2010 2338   GLUCOSE 114* 02/27/2010 2338   BUN 13 02/27/2010 2338   CREATININE 0.96 02/27/2010 2338   CALCIUM 8.9 02/27/2010 2338   GFRNONAA >60 02/27/2010 2338   GFRAA  Value: >60        The eGFR has been calculated using the MDRD equation. This calculation has not been validated in all clinical situations. eGFR's persistently <60 mL/min signify possible Chronic Kidney Disease. 02/27/2010 2338  ASSESSMENT AND PLAN  Dawsyn is a 80 years old right-handed Caucasian male, with past medical history of hypertension, anxiety disorder, now presenting with mild memory trouble, Mini-Mental Status Examination is 30 out of 30, he also complains of mild lightheadedness when getting up from seated position, now has improved with change of his blood pressure medications, he is now complains of mild vortex area pressure headaches, normal neurological examinations, normal ESR, C-reactive protein, MRI of the brain,  1,  pressure headaches,  there is no evidence of temporal arteritis, or structural lesion,  he is taking NSAIDs for his outright pain, I wrote prescription Celebrex 100 mg twice a day as needed, he is to return to clinic if his issues continued  Marcial Pacas, M.D. Ph.D.  [facility] [address] [place], [place] [address] [phone]

## 2013-06-01 ENCOUNTER — Ambulatory Visit: Payer: Medicare Other | Admitting: Nurse Practitioner

## 2013-12-11 ENCOUNTER — Other Ambulatory Visit: Payer: Self-pay | Admitting: Orthopedic Surgery

## 2014-02-04 ENCOUNTER — Telehealth: Payer: Self-pay

## 2014-02-04 NOTE — Telephone Encounter (Signed)
Patient called me back and he will see Dr.Yan Monday 02-08-2014.

## 2014-02-04 NOTE — Telephone Encounter (Signed)
Called patient and spoke with him trying to schedule him a follow up appt. Patient did not want to schedule at this time. Patient states he will call Dr.Ganji office and see if he really needs to come back.

## 2014-02-08 ENCOUNTER — Ambulatory Visit (INDEPENDENT_AMBULATORY_CARE_PROVIDER_SITE_OTHER): Payer: Medicare Other | Admitting: Neurology

## 2014-02-08 ENCOUNTER — Encounter: Payer: Self-pay | Admitting: Neurology

## 2014-02-08 VITALS — BP 163/97 | HR 80 | Ht 72.0 in | Wt 211.0 lb

## 2014-02-08 DIAGNOSIS — R202 Paresthesia of skin: Secondary | ICD-10-CM | POA: Insufficient documentation

## 2014-02-08 DIAGNOSIS — R413 Other amnesia: Secondary | ICD-10-CM

## 2014-02-08 MED ORDER — GABAPENTIN 100 MG PO CAPS: 100.0000 mg | ORAL_CAPSULE | Freq: Three times a day (TID) | ORAL | Status: DC

## 2014-02-08 NOTE — Progress Notes (Signed)
[facility]  PATIENT: Peter [name] DOB: 1933/12/30  HISTORICAL    Record is a 80  years old right-handed Caucasian male, referred by his primary care physician Dr. Melford Aase for evaluation of constellation of complaints,  mild balance issue, and cognitive impairment  He had past medical history of hypertension, there was frequent change of his blood pressure medications, a few months ago, he complains of lightheadedness, transient, when he first got up from seated position, improved after standing still for a while, his blood pressures medication was changed from Losartan to Diovan in July 2014, his symptoms have much  Improved afterwards, he denies numbness, weakness,  He has chronic  neck, low back pain  bilateral shoulder pain, is taking oxycodone as needed, he also has anxiety disorder, was taking Xanax as needed  His farternal twin Brother died of Leverne Humbles disease few years ago, he worries that the above symptoms might represent motor neuron disease, he also complains many years history of intermittent bilateral upper extremity paresthesia, getting worse over the past few weeks,  wakeup in the morning time,  has to shake his hands to make the sensation come back  He also complains of mild memory trouble, he had college degree, was retired Nutritional therapist at age 87,  in recent few months, he noticed mild short-term memory trouble, easily distracted,  forget people's name, and opened the cabinet door instead of the planned refrigerator door, but he is still highly functional, driving without getting lost. There was no family history of Alzheimer's disease. MRI of the brain was essentially normal, electrodiagnostic study has demonstrated mild to moderate bilateral carpal tunnel syndromes, laboratory showed normal ESR C-reactive protein, and TSH  UPDATE Jan 20th 2015: He is overall better, no longer has memory loss, balance issuse He is doing exercise, he has mild  neck pain, the most bothersome symptoms is his vertex area headache, mild to moderate, intermittent, only lasting for few second.  Stress triggers it. He is taking hydrocodone/APAP, meloxicam for low back pain, now he is only taking Advil daily 2 tabs tid, which has been helpful.  UPDATE Dec 7th 2015:  His low back pain has getting worse over the past few months, he also complains of radiating pain to his right leg, right knee pain, gait difficulty due to pain,  In November 25th 2015, he had sudden worsening of right leg numbness, pain, could not work up steps, he denies similar involvement of his left leg   REVIEW OF SYSTEMS: Full 14 system review of systems performed and notable only for  eye itching, abdominal pain, constipation, frequent wakening, joint pain, back pain, walking difficulty, neck stiffness, memory loss  ALLERGIES: Allergies  Allergen Reactions  . Levaquin [Levofloxacin In D5w] Other (See Comments)    Joint swelling  . Lipitor [Atorvastatin] Nausea And Vomiting  . Niaspan [Niacin] Other (See Comments)    Joint swelling  . Penicillins Swelling  . Sulfa Antibiotics Itching  . Vytorin [Ezetimibe-Simvastatin] Nausea And Vomiting  . Clindamycin/Lincomycin Rash  . Zithromax [Azithromycin] Rash    HOME MEDICATIONS: Outpatient Prescriptions Prior to Visit  Medication Sig Dispense Refill  . fluticasone (FLONASE) 50 MCG/ACT nasal spray 50 sprays as needed.    Marland Kitchen aspirin 81 MG tablet Take 81 mg by mouth daily.    . celecoxib (CELEBREX) 100 MG capsule Take 1 capsule (100 mg total) by mouth 2 (two) times daily. (Patient not taking: Reported on 02/08/2014) 60 capsule 6  . telmisartan (  MICARDIS) 40 MG tablet Take 40 mg by mouth daily.     No facility-administered medications prior to visit.    PAST MEDICAL HISTORY: Past Medical History  Diagnosis Date  . Arthritis   . Cataract   . Prostate disease   . Skin cancer   . Hypertension   . Shoulder pain   . Reflux   .  Chronic back pain   . Loss of balance   . High cholesterol   . Anxiety     PAST SURGICAL HISTORY: Past Surgical History  Procedure Laterality Date  . Vasectomy    . Appendectomy    . Colon surgery    . Hand surgery Left     FAMILY HISTORY: Family History  Problem Relation Age of Onset  . Stroke Mother   . Prostate cancer Father   . Breast cancer Daughter   . ALS Brother     SOCIAL HISTORY:  History   Social History  . Marital Status: Married    Spouse Name: Peter [name]    Number of Children: 2  . Years of Education: college   Occupational History  . Retired- Network engineer work     Social History Main Topics  . Smoking status: Former Smoker -- 1.00 packs/day for 10 years    Types: Cigarettes    Quit date: 03/05/1972  . Smokeless tobacco: Never Used  . Alcohol Use: No  . Drug Use: No  . Sexual Activity: Yes    Social History Narrative   Patient lives at home with his spouse.   Caffeine Use: 2 cups of coffee daily    PHYSICAL EXAM    Filed Vitals:   02/08/14 1255  BP: 163/97  Pulse: 80  Height: 6' (1.829 m)  Weight: 211 lb (95.709 kg)   Blood pressure sitting down 160/90, standing up 160/90  Body mass index is 28.61 kg/(m^2).   Generalized: In no acute distress  Neck: Supple, no carotid bruits   Cardiac: Regular rate rhythm  Pulmonary: Clear to auscultation bilaterally  Musculoskeletal: No deformity  Neurological examination  Mentation: Alert oriented to time, place, history taking, and causual conversation  Cranial nerve [name]: Pupils were equal round reactive to light extraocular movements were full, visual field were full on confrontational test. facial sensation and strength were normal. hearing was intact to finger rubbing bilaterally. Uvula tongue midline.  head turning and shoulder shrug and were normal and symmetric.Tongue protrusion into cheek strength was normal.  Motor: normal tone, bulk and strength.  Sensory: Intact to fine touch,  pinprick, preserved vibratory sensation, and proprioception at toes.  Coordination: Normal finger to nose, heel-to-shin bilaterally there was no truncal ataxia  Gait: Mildly stiff cautious gait  Romberg signs: Negative  Deep tendon reflexes: Brachioradialis 2/2, biceps 2/2, triceps 2/2, patellar 2/2, Achilles 1/1, plantar responses were flexor bilaterally.   DIAGNOSTIC DATA (LABS, IMAGING, TESTING) - I reviewed patient records, labs, notes, testing and imaging myself where available.  Lab Results  Component Value Date   WBC 10.8* 02/27/2010   HGB 15.1 02/27/2010   HCT 44.0 02/27/2010   MCV 92.1 02/27/2010   PLT 229 02/27/2010      Component Value Date/Time   NA 141 02/27/2010 2338   K 4.0 02/27/2010 2338   CL 108 02/27/2010 2338   CO2 26 02/27/2010 2338   GLUCOSE 114* 02/27/2010 2338   BUN 13 02/27/2010 2338   CREATININE 0.96 02/27/2010 2338   CALCIUM 8.9 02/27/2010 2338   GFRNONAA >60 02/27/2010 2338  GFRAA  02/27/2010 2338    >60        The eGFR has been calculated using the MDRD equation. This calculation has not been validated in all clinical situations. eGFR's persistently <60 mL/min signify possible Chronic Kidney Disease.   ASSESSMENT AND PLAN  Croy is a 80 years old right-handed Caucasian male, with past medical history of hypertension, anxiety disorder,  mild memory trouble, Mini-Mental Status Examination is 30 out of 30,   He complained worsening low back pain, right leg numbness, right knee pain, since November 2015, overall has much improved,  Differentiation diagnosis including right knee pathology, right lumbar radiculopathy  Continue observe his symptoms, stretching exercise, return to clinic in 2 months, if he remains symptomatic, we may initiate further evaluation, such as MRI of lumbar, EMG nerve conduction study   Marcial Pacas, M.D. Ph.D.  [facility] [address] [place], [place] [address] [phone]

## 2014-02-10 NOTE — Telephone Encounter (Signed)
Patient has been seen.

## 2014-03-31 ENCOUNTER — Ambulatory Visit: Payer: Medicare Other | Admitting: Neurology

## 2014-04-05 DIAGNOSIS — A419 Sepsis, unspecified organism: Secondary | ICD-10-CM

## 2014-04-05 HISTORY — DX: Sepsis, unspecified organism: A41.9

## 2014-04-21 ENCOUNTER — Emergency Department (HOSPITAL_COMMUNITY): Payer: Medicare Other

## 2014-04-21 ENCOUNTER — Encounter (HOSPITAL_COMMUNITY): Payer: Self-pay | Admitting: Emergency Medicine

## 2014-04-21 ENCOUNTER — Inpatient Hospital Stay (HOSPITAL_COMMUNITY)
Admission: EM | Admit: 2014-04-21 | Discharge: 2014-04-23 | DRG: 872 | Disposition: A | Discharge status: Home / Self Care | Payer: Medicare Other | Attending: Internal Medicine | Admitting: Internal Medicine

## 2014-04-21 DIAGNOSIS — K219 Gastro-esophageal reflux disease without esophagitis: Secondary | ICD-10-CM | POA: Diagnosis present

## 2014-04-21 DIAGNOSIS — J322 Chronic ethmoidal sinusitis: Secondary | ICD-10-CM | POA: Diagnosis present

## 2014-04-21 DIAGNOSIS — Z85828 Personal history of other malignant neoplasm of skin: Secondary | ICD-10-CM | POA: Diagnosis not present

## 2014-04-21 DIAGNOSIS — G8929 Other chronic pain: Secondary | ICD-10-CM | POA: Diagnosis present

## 2014-04-21 DIAGNOSIS — Z880 Allergy status to penicillin: Secondary | ICD-10-CM | POA: Diagnosis not present

## 2014-04-21 DIAGNOSIS — R509 Fever, unspecified: Secondary | ICD-10-CM

## 2014-04-21 DIAGNOSIS — A419 Sepsis, unspecified organism: Secondary | ICD-10-CM | POA: Diagnosis not present

## 2014-04-21 DIAGNOSIS — Z7982 Long term (current) use of aspirin: Secondary | ICD-10-CM

## 2014-04-21 DIAGNOSIS — Z79899 Other long term (current) drug therapy: Secondary | ICD-10-CM

## 2014-04-21 DIAGNOSIS — Z8042 Family history of malignant neoplasm of prostate: Secondary | ICD-10-CM | POA: Diagnosis not present

## 2014-04-21 DIAGNOSIS — Z823 Family history of stroke: Secondary | ICD-10-CM | POA: Diagnosis not present

## 2014-04-21 DIAGNOSIS — I10 Essential (primary) hypertension: Secondary | ICD-10-CM | POA: Diagnosis present

## 2014-04-21 DIAGNOSIS — E872 Acidosis, unspecified: Secondary | ICD-10-CM | POA: Insufficient documentation

## 2014-04-21 DIAGNOSIS — Z87891 Personal history of nicotine dependence: Secondary | ICD-10-CM

## 2014-04-21 DIAGNOSIS — J0120 Acute ethmoidal sinusitis, unspecified: Secondary | ICD-10-CM | POA: Insufficient documentation

## 2014-04-21 DIAGNOSIS — K5900 Constipation, unspecified: Secondary | ICD-10-CM

## 2014-04-21 DIAGNOSIS — M549 Dorsalgia, unspecified: Secondary | ICD-10-CM | POA: Diagnosis present

## 2014-04-21 DIAGNOSIS — Z881 Allergy status to other antibiotic agents status: Secondary | ICD-10-CM

## 2014-04-21 DIAGNOSIS — D72829 Elevated white blood cell count, unspecified: Secondary | ICD-10-CM

## 2014-04-21 DIAGNOSIS — R05 Cough: Secondary | ICD-10-CM | POA: Diagnosis present

## 2014-04-21 DIAGNOSIS — R059 Cough, unspecified: Secondary | ICD-10-CM | POA: Diagnosis present

## 2014-04-21 DIAGNOSIS — R109 Unspecified abdominal pain: Secondary | ICD-10-CM

## 2014-04-21 LAB — CBC
HCT: 45.4 % (ref 39.0–52.0)
Hemoglobin: 15.6 g/dL (ref 13.0–17.0)
MCH: 32.2 pg (ref 26.0–34.0)
MCHC: 34.4 g/dL (ref 30.0–36.0)
MCV: 93.8 fL (ref 78.0–100.0)
PLATELETS: 280 10*3/uL (ref 150–400)
RBC: 4.84 MIL/uL (ref 4.22–5.81)
RDW: 12.2 % (ref 11.5–15.5)
WBC: 24.8 10*3/uL — ABNORMAL HIGH (ref 4.0–10.5)

## 2014-04-21 LAB — COMPREHENSIVE METABOLIC PANEL
ALBUMIN: 4.2 g/dL (ref 3.5–5.2)
ALK PHOS: 54 U/L (ref 39–117)
ALT: 21 U/L (ref 0–53)
AST: 37 U/L (ref 0–37)
Anion gap: 9 (ref 5–15)
BILIRUBIN TOTAL: 1.6 mg/dL — AB (ref 0.3–1.2)
BUN: 19 mg/dL (ref 6–23)
CHLORIDE: 108 mmol/L (ref 96–112)
CO2: 24 mmol/L (ref 19–32)
Calcium: 9 mg/dL (ref 8.4–10.5)
Creatinine, Ser: 0.99 mg/dL (ref 0.50–1.35)
GFR calc non Af Amer: 76 mL/min — ABNORMAL LOW (ref >90)
GFR, EST AFRICAN AMERICAN: 89 mL/min — AB (ref >90)
GLUCOSE: 145 mg/dL — AB (ref 70–99)
POTASSIUM: 4.3 mmol/L (ref 3.5–5.1)
Sodium: 141 mmol/L (ref 135–145)
Total Protein: 7.4 g/dL (ref 6.0–8.3)

## 2014-04-21 LAB — URINALYSIS, ROUTINE W REFLEX MICROSCOPIC
Bilirubin Urine: NEGATIVE
Glucose, UA: NEGATIVE mg/dL
Hgb urine dipstick: NEGATIVE
Ketones, ur: NEGATIVE mg/dL
LEUKOCYTES UA: NEGATIVE
NITRITE: NEGATIVE
Protein, ur: NEGATIVE mg/dL
SPECIFIC GRAVITY, URINE: 1.019 (ref 1.005–1.030)
UROBILINOGEN UA: 1 mg/dL (ref 0.0–1.0)
pH: 5.5 (ref 5.0–8.0)

## 2014-04-21 LAB — I-STAT CG4 LACTIC ACID, ED
LACTIC ACID, VENOUS: 2.73 mmol/L — AB (ref 0.5–2.0)
Lactic Acid, Venous: 0.63 mmol/L (ref 0.5–2.0)

## 2014-04-21 MED ORDER — MORPHINE SULFATE 2 MG/ML IJ SOLN
2.0000 mg | Freq: Once | INTRAMUSCULAR | Status: AC
  Administered 2014-04-21: 2 mg via INTRAVENOUS
  Filled 2014-04-21: qty 1

## 2014-04-21 MED ORDER — DEXTROSE 5 % IV SOLN
1.0000 g | Freq: Three times a day (TID) | INTRAVENOUS | Status: DC
  Filled 2014-04-21: qty 1

## 2014-04-21 MED ORDER — ONDANSETRON HCL 4 MG/2ML IJ SOLN
4.0000 mg | Freq: Once | INTRAMUSCULAR | Status: AC
  Administered 2014-04-21: 4 mg via INTRAVENOUS
  Filled 2014-04-21: qty 2

## 2014-04-21 MED ORDER — DEXTROSE 5 % IV SOLN
2.0000 g | INTRAVENOUS | Status: AC
  Administered 2014-04-21: 2 g via INTRAVENOUS
  Filled 2014-04-21: qty 2

## 2014-04-21 MED ORDER — SODIUM CHLORIDE 0.9 % IV SOLN
INTRAVENOUS | Status: AC
  Administered 2014-04-22: via INTRAVENOUS

## 2014-04-21 MED ORDER — VANCOMYCIN HCL IN DEXTROSE 1-5 GM/200ML-% IV SOLN
1000.0000 mg | Freq: Once | INTRAVENOUS | Status: AC
  Administered 2014-04-21: 1000 mg via INTRAVENOUS
  Filled 2014-04-21: qty 200

## 2014-04-21 MED ORDER — SODIUM CHLORIDE 0.9 % IV SOLN
INTRAVENOUS | Status: DC
  Administered 2014-04-21: 19:00:00 via INTRAVENOUS

## 2014-04-21 MED ORDER — SODIUM CHLORIDE 0.9 % IV BOLUS (SEPSIS)
1000.0000 mL | INTRAVENOUS | Status: AC
  Administered 2014-04-21 (×3): 1000 mL via INTRAVENOUS

## 2014-04-21 MED ORDER — IOHEXOL 300 MG/ML  SOLN
25.0000 mL | Freq: Once | INTRAMUSCULAR | Status: AC | PRN
  Administered 2014-04-21: 25 mL via ORAL

## 2014-04-21 MED ORDER — VANCOMYCIN HCL 10 G IV SOLR
1250.0000 mg | Freq: Two times a day (BID) | INTRAVENOUS | Status: DC
  Administered 2014-04-22 – 2014-04-23 (×3): 1250 mg via INTRAVENOUS
  Filled 2014-04-21 (×4): qty 1250

## 2014-04-21 MED ORDER — IOHEXOL 300 MG/ML  SOLN
100.0000 mL | Freq: Once | INTRAMUSCULAR | Status: AC | PRN
  Administered 2014-04-21: 100 mL via INTRAVENOUS

## 2014-04-21 MED ORDER — ACETAMINOPHEN 325 MG PO TABS
650.0000 mg | ORAL_TABLET | Freq: Once | ORAL | Status: AC
  Administered 2014-04-21: 650 mg via ORAL
  Filled 2014-04-21: qty 2

## 2014-04-21 NOTE — ED Notes (Signed)
Pt has note from pcp, "URI in a new problem. Current episode started yesterday, problem has been gradually worsening. Maximum temperature prior to pcp was 102-102.9. Fever has been present for 1-2 days. Associated symptoms abdominal pain and rhinorrhea. "

## 2014-04-21 NOTE — ED Provider Notes (Signed)
CSN: [id number]     Arrival date & time 04/21/14  1715 History   First MD Initiated Contact with Patient 04/21/14 1802     Chief Complaint  Patient presents with  . Chills  . Fever     (Consider location/radiation/quality/duration/timing/severity/associated sxs/prior Treatment) Patient is a 81 y.o. male presenting with fever. The history is provided by the patient and the spouse. The history is limited by the condition of the patient.  Fever Associated symptoms: chills and cough   Associated symptoms: no chest pain, no confusion, no diarrhea, no dysuria, no headaches, no rash, no sore throat and no vomiting   pt indicates for past 5-6 days he has recurrent fevers, 102.8 in ED.  +chills. Pt very difficult historian.  States he had some trouble urinating in past few days, although currently denies dysuria or urgency. No flank or back pain.  States he also had some left lower abd pain in the past few days, currently better. Occasional non productive cough. Denies sore throat or runny nose. No headaches. No neck pain or stiffness. No cp or sob. No rash. No skin changes or erythema. Went to pcp office today and was sent to ED.     Past Medical History  Diagnosis Date  . Arthritis   . Cataract   . Prostate disease   . Skin cancer   . Hypertension   . Shoulder pain   . Reflux   . Chronic back pain   . Loss of balance   . High cholesterol   . Anxiety    Past Surgical History  Procedure Laterality Date  . Vasectomy    . Appendectomy    . Colon surgery    . Hand surgery Left    Family History  Problem Relation Age of Onset  . Stroke Mother   . Prostate cancer Father   . Breast cancer Daughter   . ALS Brother    History  Substance Use Topics  . Smoking status: Former Smoker -- 1.00 packs/day for 10 years    Types: Cigarettes    Quit date: 03/05/1972  . Smokeless tobacco: Never Used  . Alcohol Use: 0.6 oz/week    1 Cans of beer per week     Comment: Once a month     Review of Systems  Constitutional: Positive for fever and chills.  HENT: Negative for sore throat.   Eyes: Negative for pain, discharge and redness.  Respiratory: Positive for cough. Negative for shortness of breath.   Cardiovascular: Negative for chest pain and leg swelling.  Gastrointestinal: Positive for anal bleeding. Negative for vomiting, abdominal pain and diarrhea.  Endocrine: Negative for polyuria.  Genitourinary: Negative for dysuria and flank pain.  Musculoskeletal: Negative for back pain and neck pain.  Skin: Negative for rash.  Neurological: Negative for headaches.  Hematological: Does not bruise/bleed easily.  Psychiatric/Behavioral: Negative for confusion.      Allergies  Levaquin; Lipitor; Niaspan; Penicillins; Sulfa antibiotics; Vytorin; Clindamycin/lincomycin; and Zithromax  Home Medications   Prior to Admission medications   Medication Sig Start Date End Date Taking? Authorizing Provider  ALPRAZolam Duanne Moron) 0.5 MG tablet Take 0.5 mg by mouth at bedtime as needed for anxiety.    Historical Provider, MD  fluticasone (FLONASE) 50 MCG/ACT nasal spray 50 sprays as needed. 01/28/13   Historical Provider, MD  gabapentin (NEURONTIN) 100 MG capsule Take 1 capsule (100 mg total) by mouth 3 (three) times daily. 02/08/14   Marcial Pacas, MD  omeprazole (PRILOSEC) 20 MG  capsule Take 20 mg by mouth as needed.    Historical Provider, MD  polyethylene glycol (MIRALAX) packet Take 17 g by mouth daily as needed.    Historical Provider, MD  valsartan (DIOVAN) 40 MG tablet Take 40 mg by mouth daily.    Historical Provider, MD   BP 166/88 mmHg  Pulse 123  Temp(Src) 102.8 F (39.3 C) (Rectal)  Resp 22  SpO2 95% Physical Exam  Constitutional: He is oriented to person, place, and time. He appears well-developed and well-nourished.  Febrile.  HENT:  Head: Atraumatic.  Nose: Nose normal.  Mouth/Throat: Oropharynx is clear and moist.  Eyes: Conjunctivae are normal. Pupils are  equal, round, and reactive to light. No scleral icterus.  Neck: Normal range of motion. Neck supple. No tracheal deviation present.  No stiffness or rigidity  Cardiovascular: Regular rhythm, normal heart sounds and intact distal pulses.   Tachycardic.   Pulmonary/Chest: Effort normal and breath sounds normal. No accessory muscle usage. No respiratory distress.  Rhonchi left.   Abdominal: Soft. Bowel sounds are normal. He exhibits no distension and no mass. There is tenderness. There is no rebound and no guarding.  LLQ tenderness.   Genitourinary:  No cva tenderness. No scrotal or testicular pain or tenderness. No penile discharge. Normal ext exam.   Musculoskeletal: Normal range of motion. He exhibits no edema or tenderness.  Lymphadenopathy:    He has no cervical adenopathy.  Neurological: He is alert and oriented to person, place, and time.  Motor intact bil. stre 5/5.  Skin: Skin is warm and dry. No rash noted.  No cellulitis.   Psychiatric: He has a normal mood and affect.  Nursing note and vitals reviewed.   ED Course  Procedures (including critical care time) Labs Review     EKG Interpretation   Date/Time:  Wednesday April 21 2014 18:04:09 EST Ventricular Rate:  111 PR Interval:  171 QRS Duration: 86 QT Interval:  296 QTC Calculation: 402 R Axis:   7 Text Interpretation:  Sinus tachycardia Non-specific ST-t changes  Confirmed by Ashok Cordia  MD, Lennette Bihari ([id number]) on 04/21/2014 6:11:38 PM      MDM   Iv ns bolus. Labs. Cultures. Cxr.  Reviewed nursing notes and prior charts for additional history.   Lactate elev.   30 cc/kg ns iv bolus. Allergy to pcn and levaquin. Aztreonam and vanc iv.   Repeat lactate improved.  w cough, chest exam findings, suspect pna - no other obvious source infection.   Ct abd neg acute.  Med service contacted for admission.  cxs pending.     Mirna Mires, MD 04/21/14 [phone]

## 2014-04-21 NOTE — ED Notes (Signed)
Patient transported to CT 

## 2014-04-21 NOTE — Progress Notes (Signed)
ANTIBIOTIC CONSULT NOTE - INITIAL  Pharmacy Consult for Vancomycin and Aztreonam Indication: Sepsis  Allergies  Allergen Reactions  . Doxycycline Itching  . Levaquin [Levofloxacin In D5w] Other (See Comments)    Joint swelling  . Lipitor [Atorvastatin] Nausea And Vomiting  . Niaspan [Niacin] Other (See Comments)    Joint swelling  . Penicillins Swelling  . Sulfa Antibiotics Itching  . Vytorin [Ezetimibe-Simvastatin] Nausea And Vomiting  . Clindamycin/Lincomycin Rash  . Zithromax [Azithromycin] Rash    Patient Measurements: Height: 6' (182.9 cm) Weight: 211 lb (95.709 kg) IBW/kg (Calculated) : 77.6  Vital Signs: Temp: 103.1 F (39.5 C) (02/17 1924) Temp Source: Other (Comment) (02/17 1924) BP: 152/89 mmHg (02/17 1924) Pulse Rate: 106 (02/17 1924) Intake/Output from previous day:   Intake/Output from this shift:    Labs:  Recent Labs  04/21/14 1816  WBC 24.8*  HGB 15.6  PLT 280  CREATININE 0.99   Estimated Creatinine Clearance: 73.8 mL/min (by C-G formula based on Cr of 0.99). No results for input(s): VANCOTROUGH, VANCOPEAK, VANCORANDOM, GENTTROUGH, GENTPEAK, GENTRANDOM, TOBRATROUGH, TOBRAPEAK, TOBRARND, AMIKACINPEAK, AMIKACINTROU, AMIKACIN in the last 72 hours.   Microbiology: No results found for this or any previous visit (from the past 720 hour(s)).  Medical History: Past Medical History  Diagnosis Date  . Arthritis   . Cataract   . Prostate disease   . Skin cancer   . Hypertension   . Shoulder pain   . Reflux   . Chronic back pain   . Loss of balance   . High cholesterol   . Anxiety     Medications:  Scheduled:   Infusions:  . sodium chloride 20 mL/hr at 04/21/14 1923   PRN:   Assessment: 81 year old male presents with recurrent fevers and chills, 102.8 here in ED. Pharmacy is consulted to dose vancomycin and aztreonam for sepsis.  2/17 >> Vancomycin >> 2/17 >> Aztreonam >>  Tmax: 102.8 (rectal) WBC: 24.8 Renal: SCr 0.99, CrCl 74  ml/min CG, 62 ml/min Normalized Lactate: 2.73  2/17 blood: 2/17 UA: negative  Goal of Therapy:  Vancomycin trough level 15-20 mcg/ml  Aztreonam dose per indication, renal function  Plan:   Aztreonam 2g IV x 1, then 1g IV q8h  Vancomycin 1g IV x 1, then 1250mg  IV q12h Check trough at steady state Follow up renal function & cultures, clinical course  Peggyann Juba, PharmD, BCPS Pager: [phone] 04/21/2014,7:38 PM

## 2014-04-21 NOTE — ED Notes (Signed)
Pt c/o chills, shaking, that started on Thursday but got better until Saturday, things got worse. Pt was seen at doctor today and had temp of 99.6. Pt sate that his temperature was running 97, so for him it has increased.

## 2014-04-22 ENCOUNTER — Encounter (HOSPITAL_COMMUNITY): Payer: Self-pay | Admitting: Internal Medicine

## 2014-04-22 DIAGNOSIS — J0120 Acute ethmoidal sinusitis, unspecified: Secondary | ICD-10-CM | POA: Insufficient documentation

## 2014-04-22 DIAGNOSIS — I10 Essential (primary) hypertension: Secondary | ICD-10-CM | POA: Diagnosis present

## 2014-04-22 DIAGNOSIS — R109 Unspecified abdominal pain: Secondary | ICD-10-CM

## 2014-04-22 DIAGNOSIS — D72829 Elevated white blood cell count, unspecified: Secondary | ICD-10-CM | POA: Insufficient documentation

## 2014-04-22 DIAGNOSIS — R1084 Generalized abdominal pain: Secondary | ICD-10-CM

## 2014-04-22 DIAGNOSIS — M549 Dorsalgia, unspecified: Secondary | ICD-10-CM

## 2014-04-22 DIAGNOSIS — G8929 Other chronic pain: Secondary | ICD-10-CM

## 2014-04-22 DIAGNOSIS — E872 Acidosis, unspecified: Secondary | ICD-10-CM | POA: Insufficient documentation

## 2014-04-22 DIAGNOSIS — A419 Sepsis, unspecified organism: Secondary | ICD-10-CM | POA: Diagnosis present

## 2014-04-22 LAB — COMPREHENSIVE METABOLIC PANEL
ALT: 18 U/L (ref 0–53)
ANION GAP: 6 (ref 5–15)
AST: 17 U/L (ref 0–37)
Albumin: 3.1 g/dL — ABNORMAL LOW (ref 3.5–5.2)
Alkaline Phosphatase: 42 U/L (ref 39–117)
BUN: 16 mg/dL (ref 6–23)
CO2: 24 mmol/L (ref 19–32)
Calcium: 7.9 mg/dL — ABNORMAL LOW (ref 8.4–10.5)
Chloride: 111 mmol/L (ref 96–112)
Creatinine, Ser: 0.97 mg/dL (ref 0.50–1.35)
GFR calc non Af Amer: 77 mL/min — ABNORMAL LOW (ref >90)
GFR, EST AFRICAN AMERICAN: 89 mL/min — AB (ref >90)
Glucose, Bld: 112 mg/dL — ABNORMAL HIGH (ref 70–99)
POTASSIUM: 3.6 mmol/L (ref 3.5–5.1)
SODIUM: 141 mmol/L (ref 135–145)
TOTAL PROTEIN: 5.6 g/dL — AB (ref 6.0–8.3)
Total Bilirubin: 1 mg/dL (ref 0.3–1.2)

## 2014-04-22 LAB — CBC WITH DIFFERENTIAL/PLATELET
BASOS ABS: 0 10*3/uL (ref 0.0–0.1)
BASOS PCT: 0 % (ref 0–1)
EOS ABS: 0.3 10*3/uL (ref 0.0–0.7)
EOS PCT: 1 % (ref 0–5)
HCT: 36.5 % — ABNORMAL LOW (ref 39.0–52.0)
HEMOGLOBIN: 12.8 g/dL — AB (ref 13.0–17.0)
Lymphocytes Relative: 5 % — ABNORMAL LOW (ref 12–46)
Lymphs Abs: 1 10*3/uL (ref 0.7–4.0)
MCH: 33.2 pg (ref 26.0–34.0)
MCHC: 35.1 g/dL (ref 30.0–36.0)
MCV: 94.6 fL (ref 78.0–100.0)
MONOS PCT: 5 % (ref 3–12)
Monocytes Absolute: 1 10*3/uL (ref 0.1–1.0)
Neutro Abs: 20 10*3/uL — ABNORMAL HIGH (ref 1.7–7.7)
Neutrophils Relative %: 89 % — ABNORMAL HIGH (ref 43–77)
Platelets: 208 10*3/uL (ref 150–400)
RBC: 3.86 MIL/uL — ABNORMAL LOW (ref 4.22–5.81)
RDW: 12.5 % (ref 11.5–15.5)
WBC: 22.5 10*3/uL — ABNORMAL HIGH (ref 4.0–10.5)

## 2014-04-22 LAB — INFLUENZA PANEL BY PCR (TYPE A & B)
H1N1FLUPCR: NOT DETECTED
Influenza A By PCR: NEGATIVE
Influenza B By PCR: NEGATIVE

## 2014-04-22 LAB — LACTIC ACID, PLASMA: Lactic Acid, Venous: 1.1 mmol/L (ref 0.5–2.0)

## 2014-04-22 MED ORDER — ASPIRIN EC 81 MG PO TBEC
81.0000 mg | DELAYED_RELEASE_TABLET | Freq: Every day | ORAL | Status: DC
  Administered 2014-04-22 – 2014-04-23 (×2): 81 mg via ORAL
  Filled 2014-04-22 (×2): qty 1

## 2014-04-22 MED ORDER — MAGNESIUM CITRATE PO SOLN
1.0000 | Freq: Once | ORAL | Status: AC
  Administered 2014-04-22: 1 via ORAL

## 2014-04-22 MED ORDER — ENOXAPARIN SODIUM 40 MG/0.4ML ~~LOC~~ SOLN
40.0000 mg | SUBCUTANEOUS | Status: DC
  Administered 2014-04-22 – 2014-04-23 (×2): 40 mg via SUBCUTANEOUS
  Filled 2014-04-22 (×2): qty 0.4

## 2014-04-22 MED ORDER — MORPHINE SULFATE 2 MG/ML IJ SOLN
1.0000 mg | INTRAMUSCULAR | Status: DC | PRN
  Administered 2014-04-22 – 2014-04-23 (×2): 1 mg via INTRAVENOUS
  Filled 2014-04-22 (×2): qty 1

## 2014-04-22 MED ORDER — DEXTROSE 5 % IV SOLN
1.0000 g | Freq: Three times a day (TID) | INTRAVENOUS | Status: DC
  Administered 2014-04-22 – 2014-04-23 (×5): 1 g via INTRAVENOUS
  Filled 2014-04-22 (×6): qty 1

## 2014-04-22 MED ORDER — CETYLPYRIDINIUM CHLORIDE 0.05 % MT LIQD: 7.0000 mL | Freq: Two times a day (BID) | OROMUCOSAL | Status: DC

## 2014-04-22 MED ORDER — ONDANSETRON HCL 4 MG PO TABS: 4.0000 mg | ORAL_TABLET | Freq: Four times a day (QID) | ORAL | Status: DC | PRN

## 2014-04-22 MED ORDER — ACETAMINOPHEN 650 MG RE SUPP: 650.0000 mg | Freq: Four times a day (QID) | RECTAL | Status: DC | PRN

## 2014-04-22 MED ORDER — SODIUM CHLORIDE 0.9 % IV SOLN
INTRAVENOUS | Status: AC
  Administered 2014-04-22 (×3): via INTRAVENOUS

## 2014-04-22 MED ORDER — ONDANSETRON HCL 4 MG/2ML IJ SOLN: 4.0000 mg | Freq: Four times a day (QID) | INTRAMUSCULAR | Status: DC | PRN

## 2014-04-22 MED ORDER — ALPRAZOLAM 0.25 MG PO TABS: 0.2500 mg | ORAL_TABLET | Freq: Every evening | ORAL | Status: DC | PRN

## 2014-04-22 MED ORDER — FLUTICASONE PROPIONATE 50 MCG/ACT NA SUSP
1.0000 | Freq: Every day | NASAL | Status: DC | PRN
  Filled 2014-04-22: qty 16

## 2014-04-22 MED ORDER — ACETAMINOPHEN 325 MG PO TABS
650.0000 mg | ORAL_TABLET | Freq: Four times a day (QID) | ORAL | Status: DC | PRN
  Administered 2014-04-22 – 2014-04-23 (×3): 650 mg via ORAL
  Filled 2014-04-22 (×3): qty 2

## 2014-04-22 MED ORDER — IRBESARTAN 75 MG PO TABS
37.5000 mg | ORAL_TABLET | Freq: Every day | ORAL | Status: DC
  Administered 2014-04-22 – 2014-04-23 (×2): 37.5 mg via ORAL
  Filled 2014-04-22 (×2): qty 0.5

## 2014-04-22 MED ORDER — POLYETHYLENE GLYCOL 3350 17 G PO PACK: 17.0000 g | PACK | Freq: Every day | ORAL | Status: DC | PRN

## 2014-04-22 MED ORDER — PANTOPRAZOLE SODIUM 40 MG PO TBEC
40.0000 mg | DELAYED_RELEASE_TABLET | Freq: Every day | ORAL | Status: DC
  Administered 2014-04-22 – 2014-04-23 (×2): 40 mg via ORAL
  Filled 2014-04-22 (×2): qty 1

## 2014-04-22 NOTE — Progress Notes (Signed)
UR complete 

## 2014-04-22 NOTE — H&P (Signed)
[facility] History and Physical  Peter [name]:[id number] DOB: Jul 26, 1933 DOA: 04/21/2014  Referring physician: ER physician. PCP: Chesley Noon, MD   Chief Complaint: Fever and chills.  HPI: Peter [name] is a 81 y.o. male with history of hypertension presents to the ER because of fever and chills. Patient states over the last 3-4 days patient has been not feeling well and has been having sinusitis-like symptoms. Patient also had been having weakness with fever and chills. In addition patient has been having some infraumbilical abdominal pain which was burning in sensation over the last 3-4 days. Denies any dysuria or discharges or diarrhea. Denies any nausea vomiting. Denies any productive cough except for running nose. In the ER patient was found to be febrile with leukocytosis and left chest was elevated. Patient also initially was mildly tachycardic. Patient was given fluid bolus and admitted for sepsis source not clear. CT abdomen and pelvis was unremarkable. Chest x-ray did not show any definite infiltrates and UA was unremarkable.   Review of Systems: As presented in the history of presenting illness, rest negative.  Past Medical History  Diagnosis Date  . Arthritis   . Cataract   . Prostate disease   . Skin cancer   . Hypertension   . Shoulder pain   . Reflux   . Chronic back pain   . Loss of balance   . High cholesterol   . Anxiety    Past Surgical History  Procedure Laterality Date  . Vasectomy    . Appendectomy    . Colon surgery    . Hand surgery Left    Social History:  reports that he quit smoking about 42 years ago. His smoking use included Cigarettes. He has a 10 pack-year smoking history. He has never used smokeless tobacco. He reports that he drinks about 0.6 oz of alcohol per week. He reports that he does not use illicit drugs. Where does patient live home. Can patient participate in ADLs? Yes.  Allergies  Allergen Reactions  .  Doxycycline Itching  . Levaquin [Levofloxacin In D5w] Other (See Comments)    Joint swelling  . Lipitor [Atorvastatin] Nausea And Vomiting  . Lisinopril Other (See Comments)    unknown  . Niaspan [Niacin] Other (See Comments)    Joint swelling  . Penicillins Swelling  . Sulfa Antibiotics Itching  . Vytorin [Ezetimibe-Simvastatin] Nausea And Vomiting  . Clindamycin/Lincomycin Rash  . Zithromax [Azithromycin] Rash    Family History:  Family History  Problem Relation Age of Onset  . Stroke Mother   . Prostate cancer Father   . Breast cancer Daughter   . ALS Brother       Prior to Admission medications   Medication Sig Start Date End Date Taking? Authorizing Provider  acetaminophen (TYLENOL) 500 MG tablet Take 1,000 mg by mouth at bedtime.   Yes Historical Provider, MD  ALPRAZolam Duanne Moron) 0.5 MG tablet Take 0.25 mg by mouth at bedtime as needed for anxiety.    Yes Historical Provider, MD  aspirin EC 81 MG tablet Take 81 mg by mouth daily.   Yes Historical Provider, MD  fluticasone (FLONASE) 50 MCG/ACT nasal spray Place 1 spray into the nose daily as needed for allergies.  01/28/13  Yes Historical Provider, MD  hydroxypropyl methylcellulose / hypromellose (ISOPTO TEARS / GONIOVISC) 2.5 % ophthalmic solution Place 1 drop into both eyes 3 (three) times daily as needed for dry eyes.   Yes Historical Provider, MD  omeprazole (  PRILOSEC) 20 MG capsule Take 20 mg by mouth daily as needed (heart burn).    Yes Historical Provider, MD  polyethylene glycol (MIRALAX) packet Take 17 g by mouth daily as needed for moderate constipation.    Yes Historical Provider, MD  valsartan (DIOVAN) 40 MG tablet Take 40 mg by mouth daily.   Yes Historical Provider, MD  gabapentin (NEURONTIN) 100 MG capsule Take 1 capsule (100 mg total) by mouth 3 (three) times daily. Patient not taking: Reported on 04/21/2014 02/08/14   Marcial Pacas, MD    Physical Exam: Filed Vitals:   04/21/14 2245 04/21/14 2315 04/21/14 2353  04/22/14 0019  BP: 134/61 110/53 105/48 137/78  Pulse: 95 88 88 92  Temp:   101.9 F (38.8 C) 98.9 F (37.2 C)  TempSrc:   Other (Comment) Oral  Resp: 37 30 28 20   Height:    6' (1.829 m)  Weight:    96 kg (211 lb 10.3 oz)  SpO2: 94% 95% 90% 99%     General:  Well-developed and nourished.  Eyes: Anicteric no pallor.  ENT: No discharge from the ears eyes nose and mouth.  Neck: No mass felt on neck rigidity.  Cardiovascular: S1-S2 heard.  Respiratory: No rhonchi or crepitations.  Abdomen: Soft nontender bowel sounds present. There is no rash over the abdomen.  Skin: No rash over the abdomen.  Musculoskeletal: No edema.  Psychiatric: Appears normal.  Neurologic: Alert awake oriented to time place and person. Moves all extremities.  Labs on Admission:  Basic Metabolic Panel:  Recent Labs Lab 04/21/14 1816  NA 141  K 4.3  CL 108  CO2 24  GLUCOSE 145*  BUN 19  CREATININE 0.99  CALCIUM 9.0   Liver Function Tests:  Recent Labs Lab 04/21/14 1816  AST 37  ALT 21  ALKPHOS 54  BILITOT 1.6*  PROT 7.4  ALBUMIN 4.2   No results for input(s): LIPASE, AMYLASE in the last 168 hours. No results for input(s): AMMONIA in the last 168 hours. CBC:  Recent Labs Lab 04/21/14 1816  WBC 24.8*  HGB 15.6  HCT 45.4  MCV 93.8  PLT 280   Cardiac Enzymes: No results for input(s): CKTOTAL, CKMB, CKMBINDEX, TROPONINI in the last 168 hours.  BNP (last 3 results) No results for input(s): BNP in the last 8760 hours.  ProBNP (last 3 results) No results for input(s): PROBNP in the last 8760 hours.  CBG: No results for input(s): GLUCAP in the last 168 hours.  Radiological Exams on Admission: Dg Chest 2 View  04/21/2014   CLINICAL DATA:  Fever and chills.  EXAM: CHEST  2 VIEW  COMPARISON:  06/18/2008.  FINDINGS: Normal sized heart. Clear lungs. Thoracic spine degenerative changes.  IMPRESSION: No acute abnormality.   Electronically Signed   By: Claudie Revering M.D.   On:  04/21/2014 19:30   Ct Abdomen Pelvis W Contrast  04/21/2014   CLINICAL DATA:  Lower abdominal pain for 6 days with fever, weakness and nausea. Vomiting for 2 days.  EXAM: CT ABDOMEN AND PELVIS WITH CONTRAST  TECHNIQUE: Multidetector CT imaging of the abdomen and pelvis was performed using the standard protocol following bolus administration of intravenous contrast.  CONTRAST:  100 mL OMNIPAQUE IOHEXOL 300 MG/ML SOLN, 25 mL OMNIPAQUE IOHEXOL 300 MG/ML SOLN  COMPARISON:  None.  FINDINGS: The lung bases are clear. No pleural or pericardial effusion. Calcific coronary artery disease is identified.  The liver, gallbladder, spleen, adrenal glands, pancreas and left kidney appear  normal. A small right renal is cyst is noted. There is aortoiliac atherosclerosis without aneurysm.  A Foley catheter is in place in a decompressed urinary bladder. The prostate gland is mildly enlarged. Fat containing inguinal hernias are noted. Diverticulosis is most notable in the sigmoid but there is no evidence of diverticulitis. The appendix has been removed. The stomach and small bowel are unremarkable. No lymphadenopathy or fluid is identified.  There is partial visualization of a well-circumscribed, benign-appearing lesion in the proximal right femur. Imaged bones are otherwise unremarkable.  IMPRESSION: No acute abnormality abdomen or pelvis. No finding to explain the patient's symptoms.  Calcific aortic and coronary atherosclerosis.  Diverticulosis without diverticulitis.  Small fat containing bilateral inguinal hernias.   Electronically Signed   By: Inge Rise M.D.   On: 04/21/2014 22:48     Assessment/Plan Principal Problem:   Sepsis Active Problems:   Cough   Abdominal pain   Essential hypertension   1. Sepsis - source not clear likely respiratory tract given the sinusitis symptoms. Patient also has some nonsurgical abdominal pain mostly in infraumbilical area and CAT scan of the abdomen is being unremarkable UA  does not show any definite features of infection. And patient also has no rash over the abdomen to suggest any zoster or any other infection. We'll get influenza PCR and patient has been placed on empiric antibiotics for sepsis. Follow blood cultures. 2. Hypertension - continue home medications. 3. Abdominal pain - see #1.   DVT Prophylaxis Lovenox.  Code Status: Full code.  Family Communication: None.  Disposition Plan: Admit to inpatient.    Pearson Picou N. [facility] Pager [phone].  If 7PM-7AM, please contact night-coverage [email or web address] Password [email or web address] 04/22/2014, 1:34 AM

## 2014-04-22 NOTE — Progress Notes (Signed)
[facility] PROGRESS NOTE  Peter [name] QQV:[id number] DOB: 05-17-1933 DOA: 04/21/2014 PCP: Peter Noon, MD  Assessment/Plan: 1. Sepsis with probable sinusitis 1. Symptoms of URI prior to admission noted 2. WBC improving 3. Pt is continued on empiric vancomycin and aztreonam 4. Blood cx pending 5. TM appears unremarkable on otoscopic exam 2. Leukocytosis 1. Improving with abx per above 2. Likely secondary to acute infection 3. HTN 1. BP stable 2. Cont current regimen 4. Abd pain 1. CT personally reviewed 2. Stool in the ascending and transverse colons per my own read 3. Will give a trial of mg citrate 5. Chronic back pain 1. Improved with pillow to lower back 2. Spine nontender to palpation making diskitis is less likely 6. DVT prophylaxis 1. lovenox subq  Code Status: Full Family Communication: Pt in room, wife at bedside (indicate person spoken with, relationship, and if by phone, the number) Disposition Plan: Pending   Consultants:  none  Procedures:  none  Antibiotics:  Aztreonam 2/18>>>  Vancomycin 2/18>>> (indicate start date, and stop date if known)  HPI/Subjective: Feels much better today. Eager to go home  Objective: Filed Vitals:   04/21/14 2353 04/22/14 0019 04/22/14 0605 04/22/14 1206  BP: 105/48 137/78 114/58 111/57  Pulse: 88 92 79 73  Temp: 101.9 F (38.8 C) 98.9 F (37.2 C) 100.4 F (38 C) 98.5 F (36.9 C)  TempSrc: Other (Comment) Oral Oral Oral  Resp: 28 20 18 18   Height:  6' (1.829 m)    Weight:  96 kg (211 lb 10.3 oz)    SpO2: 90% 99% 96% 97%    Intake/Output Summary (Last 24 hours) at 04/22/14 1532 Last data filed at 04/21/14 2359  Gross per 24 hour  Intake      0 ml  Output   2200 ml  Net  -2200 ml   Filed Weights   04/21/14 1826 04/22/14 0019  Weight: 95.709 kg (211 lb) 96 kg (211 lb 10.3 oz)    Exam:   General:  Awake, in nad  Cardiovascular: regular, s1, s2  Respiratory: normal resp effort,  no wheezing  Abdomen: soft, decreased BS, nontender  Musculoskeletal: perfused, no clubbing  HEENT: TM with good light reflex B with no drainage noted   Data Reviewed: Basic Metabolic Panel:  Recent Labs Lab 04/21/14 1816 04/22/14 0518  NA 141 141  K 4.3 3.6  CL 108 111  CO2 24 24  GLUCOSE 145* 112*  BUN 19 16  CREATININE 0.99 0.97  CALCIUM 9.0 7.9*   Liver Function Tests:  Recent Labs Lab 04/21/14 1816 04/22/14 0518  AST 37 17  ALT 21 18  ALKPHOS 54 42  BILITOT 1.6* 1.0  PROT 7.4 5.6*  ALBUMIN 4.2 3.1*   No results for input(s): LIPASE, AMYLASE in the last 168 hours. No results for input(s): AMMONIA in the last 168 hours. CBC:  Recent Labs Lab 04/21/14 1816 04/22/14 0518  WBC 24.8* 22.5*  NEUTROABS  --  20.0*  HGB 15.6 12.8*  HCT 45.4 36.5*  MCV 93.8 94.6  PLT 280 208   Cardiac Enzymes: No results for input(s): CKTOTAL, CKMB, CKMBINDEX, TROPONINI in the last 168 hours. BNP (last 3 results) No results for input(s): BNP in the last 8760 hours.  ProBNP (last 3 results) No results for input(s): PROBNP in the last 8760 hours.  CBG: No results for input(s): GLUCAP in the last 168 hours.  No results found for this or any previous visit (from the past  240 hour(s)).   Studies: Dg Chest 2 View  04/21/2014   CLINICAL DATA:  Fever and chills.  EXAM: CHEST  2 VIEW  COMPARISON:  06/18/2008.  FINDINGS: Normal sized heart. Clear lungs. Thoracic spine degenerative changes.  IMPRESSION: No acute abnormality.   Electronically Signed   By: Claudie Revering M.D.   On: 04/21/2014 19:30   Ct Abdomen Pelvis W Contrast  04/21/2014   CLINICAL DATA:  Lower abdominal pain for 6 days with fever, weakness and nausea. Vomiting for 2 days.  EXAM: CT ABDOMEN AND PELVIS WITH CONTRAST  TECHNIQUE: Multidetector CT imaging of the abdomen and pelvis was performed using the standard protocol following bolus administration of intravenous contrast.  CONTRAST:  100 mL OMNIPAQUE IOHEXOL 300  MG/ML SOLN, 25 mL OMNIPAQUE IOHEXOL 300 MG/ML SOLN  COMPARISON:  None.  FINDINGS: The lung bases are clear. No pleural or pericardial effusion. Calcific coronary artery disease is identified.  The liver, gallbladder, spleen, adrenal glands, pancreas and left kidney appear normal. A small right renal is cyst is noted. There is aortoiliac atherosclerosis without aneurysm.  A Foley catheter is in place in a decompressed urinary bladder. The prostate gland is mildly enlarged. Fat containing inguinal hernias are noted. Diverticulosis is most notable in the sigmoid but there is no evidence of diverticulitis. The appendix has been removed. The stomach and small bowel are unremarkable. No lymphadenopathy or fluid is identified.  There is partial visualization of a well-circumscribed, benign-appearing lesion in the proximal right femur. Imaged bones are otherwise unremarkable.  IMPRESSION: No acute abnormality abdomen or pelvis. No finding to explain the patient's symptoms.  Calcific aortic and coronary atherosclerosis.  Diverticulosis without diverticulitis.  Small fat containing bilateral inguinal hernias.   Electronically Signed   By: Inge Rise M.D.   On: 04/21/2014 22:48    Scheduled Meds: . aspirin EC  81 mg Oral Daily  . aztreonam  1 g Intravenous Q8H  . enoxaparin (LOVENOX) injection  40 mg Subcutaneous Q24H  . irbesartan  37.5 mg Oral Daily  . pantoprazole  40 mg Oral Daily  . vancomycin  1,250 mg Intravenous Q12H   Continuous Infusions: . sodium chloride 100 mL/hr at 04/22/14 0092    Principal Problem:   Sepsis Active Problems:   Cough   Abdominal pain   Essential hypertension   Peter [name] K  [facility] Pager [phone]. If 7PM-7AM, please contact night-coverage at [email or web address], password [facility] 04/22/2014, 3:32 PM  LOS: 1 day

## 2014-04-23 DIAGNOSIS — K5900 Constipation, unspecified: Secondary | ICD-10-CM

## 2014-04-23 DIAGNOSIS — K5909 Other constipation: Secondary | ICD-10-CM

## 2014-04-23 LAB — BASIC METABOLIC PANEL
Anion gap: 5 (ref 5–15)
BUN: 14 mg/dL (ref 6–23)
CO2: 27 mmol/L (ref 19–32)
Calcium: 8 mg/dL — ABNORMAL LOW (ref 8.4–10.5)
Chloride: 112 mmol/L (ref 96–112)
Creatinine, Ser: 0.86 mg/dL (ref 0.50–1.35)
GFR calc Af Amer: >90 mL/min (ref >90)
GFR, EST NON AFRICAN AMERICAN: 81 mL/min — AB (ref >90)
Glucose, Bld: 99 mg/dL (ref 70–99)
POTASSIUM: 3.9 mmol/L (ref 3.5–5.1)
SODIUM: 144 mmol/L (ref 135–145)

## 2014-04-23 LAB — CBC
HCT: 38.1 % — ABNORMAL LOW (ref 39.0–52.0)
Hemoglobin: 12.3 g/dL — ABNORMAL LOW (ref 13.0–17.0)
MCH: 31.1 pg (ref 26.0–34.0)
MCHC: 32.3 g/dL (ref 30.0–36.0)
MCV: 96.5 fL (ref 78.0–100.0)
Platelets: 195 10*3/uL (ref 150–400)
RBC: 3.95 MIL/uL — ABNORMAL LOW (ref 4.22–5.81)
RDW: 12.6 % (ref 11.5–15.5)
WBC: 8.6 10*3/uL (ref 4.0–10.5)

## 2014-04-23 MED ORDER — CEFUROXIME AXETIL 250 MG PO TABS: 250.0000 mg | ORAL_TABLET | Freq: Two times a day (BID) | ORAL | Status: DC

## 2014-04-23 MED ORDER — METRONIDAZOLE 500 MG PO TABS: 500.0000 mg | ORAL_TABLET | Freq: Three times a day (TID) | ORAL | Status: DC

## 2014-04-23 NOTE — Progress Notes (Signed)
ANTIBIOTIC CONSULT NOTE - INITIAL  Pharmacy Consult for Vancomycin and Aztreonam Indication: Sepsis  Allergies  Allergen Reactions  . Doxycycline Itching  . Levaquin [Levofloxacin In D5w] Other (See Comments)    Joint swelling  . Lipitor [Atorvastatin] Nausea And Vomiting  . Lisinopril Other (See Comments)    unknown  . Niaspan [Niacin] Other (See Comments)    Joint swelling  . Penicillins Swelling  . Sulfa Antibiotics Itching  . Vytorin [Ezetimibe-Simvastatin] Nausea And Vomiting  . Clindamycin/Lincomycin Rash  . Zithromax [Azithromycin] Rash    Patient Measurements: Height: 6' (182.9 cm) Weight: 211 lb 10.3 oz (96 kg) IBW/kg (Calculated) : 77.6  Vital Signs: Temp: 98.8 F (37.1 C) (02/19 0711) Temp Source: Oral (02/19 0711) BP: 126/70 mmHg (02/19 0711) Pulse Rate: 64 (02/19 0711) Intake/Output from previous day: 02/18 0701 - 02/19 0700 In: 3281.7 [P.O.:360; I.V.:2921.7] Out: -  Intake/Output from this shift: Total I/O In: 120 [P.O.:120] Out: -   Labs:  Recent Labs  04/21/14 1816 04/22/14 0518 04/23/14 0546  WBC 24.8* 22.5* 8.6  HGB 15.6 12.8* 12.3*  PLT 280 208 195  CREATININE 0.99 0.97 0.86   Estimated Creatinine Clearance: 85.1 mL/min (by C-G formula based on Cr of 0.86). No results for input(s): VANCOTROUGH, VANCOPEAK, VANCORANDOM, GENTTROUGH, GENTPEAK, GENTRANDOM, TOBRATROUGH, TOBRAPEAK, TOBRARND, AMIKACINPEAK, AMIKACINTROU, AMIKACIN in the last 72 hours.   Microbiology: Recent Results (from the past 720 hour(s))  Blood culture (routine x 2)     Status: None (Preliminary result)   Collection Time: 04/21/14  6:16 PM  Result Value Ref Range Status   Specimen Description BLOOD LEFT ARM  Final   Special Requests BOTTLES DRAWN AEROBIC AND ANAEROBIC 5CC EACH  Final   Culture   Final           BLOOD CULTURE RECEIVED NO GROWTH TO DATE CULTURE WILL BE HELD FOR 5 DAYS BEFORE ISSUING A FINAL NEGATIVE REPORT Performed at [facility]    Report  Status PENDING  Incomplete  Blood culture (routine x 2)     Status: None (Preliminary result)   Collection Time: 04/21/14  6:25 PM  Result Value Ref Range Status   Specimen Description BLOOD RIGHT ANTECUBITAL  Final   Special Requests BOTTLES DRAWN AEROBIC AND ANAEROBIC 3CC EACH  Final   Culture   Final           BLOOD CULTURE RECEIVED NO GROWTH TO DATE CULTURE WILL BE HELD FOR 5 DAYS BEFORE ISSUING A FINAL NEGATIVE REPORT Performed at [facility]    Report Status PENDING  Incomplete    Medical History: Past Medical History  Diagnosis Date  . Arthritis   . Cataract   . Prostate disease   . Skin cancer   . Hypertension   . Shoulder pain   . Reflux   . Chronic back pain   . Loss of balance   . High cholesterol   . Anxiety     Medications:  Scheduled:  . aspirin EC  81 mg Oral Daily  . aztreonam  1 g Intravenous Q8H  . enoxaparin (LOVENOX) injection  40 mg Subcutaneous Q24H  . irbesartan  37.5 mg Oral Daily  . pantoprazole  40 mg Oral Daily  . vancomycin  1,250 mg Intravenous Q12H   Infusions:    PRN:   Assessment: 81 year old male presents with recurrent fevers and chills, 102.8 here in ED. Pharmacy is consulted to dose vancomycin and aztreonam for sepsis.  2/17 >> Vancomycin >>  2/17 >> Aztreonam >>  Today: Tmax: AF WBC: WNL Renal: SCr 0.86, CrCl 85 ml/min CG, 71 ml/min Normalized Lactate: 1.1  2/17 blood: NGTD 2/17 UA: negative  Goal of Therapy:  Vancomycin trough level 15-20 mcg/ml  Aztreonam dose per indication, renal function  Plan:   Continue Aztreonam 1g IV q8h  Continue Vancomycin 1250mg  IV q12h Check trough today before 5th dose Follow up renal function & cultures, clinical course  Dolly Rias RPh 04/23/2014, 9:56 AM Pager [phone]

## 2014-04-23 NOTE — Discharge Summary (Signed)
Physician Discharge Summary  Peter [name] DOB: 18-Oct-1933 DOA: 04/21/2014  PCP: Chesley Noon, MD  Admit date: 04/21/2014 Discharge date: 04/23/2014  Time spent: 25 minutes  Recommendations for Outpatient Follow-up:  1. Follow up with PCP in 1-2 weeks 2. If patient continues to have issues with constipation, consider outpatient referral to GI  Discharge Diagnoses:  Principal Problem:   Sepsis Active Problems:   Chronic back pain   Cough   Abdominal pain   Essential hypertension   Subacute ethmoidal sinusitis   Lactic acid increased   Leucocytosis   Discharge Condition: Improved  Diet recommendation: Heart healthy  Filed Weights   04/21/14 1826 04/22/14 0019  Weight: 95.709 kg (211 lb) 96 kg (211 lb 10.3 oz)    History of present illness:  Please review h and p from 2/18 for details. Briefly, pt presented with fevers and chills, found to have sepsis with possible URI/sinusitis. The patient was admitted for further work up.  Hospital Course:  1. Sepsis with probable sinusitis 1. Symptoms of URI prior to admission noted 2. WBC and symptoms improved drastically with empiric vancomycin and aztreonam 3. Final results of blood cx pending still at time of discharge, would have PCP follow up on final results, thus far cultures neg x2 4. TM appeared unremarkable on otoscopic exam 5. Patient has multiple antibiotic allergies. Have discussed with pharmacist who recommended ceftin with flagyl on discharge to complete abx course 2. Leukocytosis 1. Improved to normal with abx per above 2. Likely secondary to acute infection 3. HTN 1. BP stable 2. Cont current regimen 4. Abd pain 1. CT personally reviewed 2. Stool in the ascending and transverse colons per my own read 3. Given a trial of mg citrate with multiple reported BM documented 5. Chronic back pain 1. Improved with pillow to lower back 2. Spine was nontender to palpation making diskitis is less  likely 6. DVT prophylaxis 1. lovenox subq   Consultations:  none  Discharge Exam: Filed Vitals:   04/22/14 0605 04/22/14 1206 04/22/14 2104 04/23/14 0711  BP: 114/58 111/57 102/66 126/70  Pulse: 79 73 70 64  Temp: 100.4 F (38 C) 98.5 F (36.9 C) 98.4 F (36.9 C) 98.8 F (37.1 C)  TempSrc: Oral Oral Oral Oral  Resp: 18 18 16 18   Height:      Weight:      SpO2: 96% 97% 99% 95%    General: awake, in nad Cardiovascular: regular, s1, s2 Respiratory: normal resp effort,no wheezing  Discharge Instructions     Medication List    TAKE these medications        acetaminophen 500 MG tablet  Commonly known as:  TYLENOL  Take 1,000 mg by mouth at bedtime.     ALPRAZolam 0.5 MG tablet  Commonly known as:  XANAX  Take 0.25 mg by mouth at bedtime as needed for anxiety.     aspirin EC 81 MG tablet  Take 81 mg by mouth daily.     fluticasone 50 MCG/ACT nasal spray  Commonly known as:  FLONASE  Place 1 spray into the nose daily as needed for allergies.     gabapentin 100 MG capsule  Commonly known as:  NEURONTIN  Take 1 capsule (100 mg total) by mouth 3 (three) times daily.     hydroxypropyl methylcellulose / hypromellose 2.5 % ophthalmic solution  Commonly known as:  ISOPTO TEARS / GONIOVISC  Place 1 drop into both eyes 3 (three) times daily as needed  for dry eyes.     MIRALAX packet  Generic drug:  polyethylene glycol  Take 17 g by mouth daily as needed for moderate constipation.     omeprazole 20 MG capsule  Commonly known as:  PRILOSEC  Take 20 mg by mouth daily as needed (heart burn).     valsartan 40 MG tablet  Commonly known as:  DIOVAN  Take 40 mg by mouth daily.       Allergies  Allergen Reactions  . Doxycycline Itching  . Levaquin [Levofloxacin In D5w] Other (See Comments)    Joint swelling  . Lipitor [Atorvastatin] Nausea And Vomiting  . Lisinopril Other (See Comments)    unknown  . Niaspan [Niacin] Other (See Comments)    Joint swelling  .  Penicillins Swelling  . Sulfa Antibiotics Itching  . Vytorin [Ezetimibe-Simvastatin] Nausea And Vomiting  . Clindamycin/Lincomycin Rash  . Zithromax [Azithromycin] Rash      The results of significant diagnostics from this hospitalization (including imaging, microbiology, ancillary and laboratory) are listed below for reference.    Significant Diagnostic Studies: Dg Chest 2 View  04/21/2014   CLINICAL DATA:  Fever and chills.  EXAM: CHEST  2 VIEW  COMPARISON:  06/18/2008.  FINDINGS: Normal sized heart. Clear lungs. Thoracic spine degenerative changes.  IMPRESSION: No acute abnormality.   Electronically Signed   By: Claudie Revering M.D.   On: 04/21/2014 19:30   Ct Abdomen Pelvis W Contrast  04/21/2014   CLINICAL DATA:  Lower abdominal pain for 6 days with fever, weakness and nausea. Vomiting for 2 days.  EXAM: CT ABDOMEN AND PELVIS WITH CONTRAST  TECHNIQUE: Multidetector CT imaging of the abdomen and pelvis was performed using the standard protocol following bolus administration of intravenous contrast.  CONTRAST:  100 mL OMNIPAQUE IOHEXOL 300 MG/ML SOLN, 25 mL OMNIPAQUE IOHEXOL 300 MG/ML SOLN  COMPARISON:  None.  FINDINGS: The lung bases are clear. No pleural or pericardial effusion. Calcific coronary artery disease is identified.  The liver, gallbladder, spleen, adrenal glands, pancreas and left kidney appear normal. A small right renal is cyst is noted. There is aortoiliac atherosclerosis without aneurysm.  A Foley catheter is in place in a decompressed urinary bladder. The prostate gland is mildly enlarged. Fat containing inguinal hernias are noted. Diverticulosis is most notable in the sigmoid but there is no evidence of diverticulitis. The appendix has been removed. The stomach and small bowel are unremarkable. No lymphadenopathy or fluid is identified.  There is partial visualization of a well-circumscribed, benign-appearing lesion in the proximal right femur. Imaged bones are otherwise  unremarkable.  IMPRESSION: No acute abnormality abdomen or pelvis. No finding to explain the patient's symptoms.  Calcific aortic and coronary atherosclerosis.  Diverticulosis without diverticulitis.  Small fat containing bilateral inguinal hernias.   Electronically Signed   By: Inge Rise M.D.   On: 04/21/2014 22:48    Microbiology: Recent Results (from the past 240 hour(s))  Blood culture (routine x 2)     Status: None (Preliminary result)   Collection Time: 04/21/14  6:16 PM  Result Value Ref Range Status   Specimen Description BLOOD LEFT ARM  Final   Special Requests BOTTLES DRAWN AEROBIC AND ANAEROBIC 5CC EACH  Final   Culture   Final           BLOOD CULTURE RECEIVED NO GROWTH TO DATE CULTURE WILL BE HELD FOR 5 DAYS BEFORE ISSUING A FINAL NEGATIVE REPORT Performed at [facility]    Report Status PENDING  Incomplete  Blood culture (routine x 2)     Status: None (Preliminary result)   Collection Time: 04/21/14  6:25 PM  Result Value Ref Range Status   Specimen Description BLOOD RIGHT ANTECUBITAL  Final   Special Requests BOTTLES DRAWN AEROBIC AND ANAEROBIC 3CC EACH  Final   Culture   Final           BLOOD CULTURE RECEIVED NO GROWTH TO DATE CULTURE WILL BE HELD FOR 5 DAYS BEFORE ISSUING A FINAL NEGATIVE REPORT Performed at [facility]    Report Status PENDING  Incomplete     Labs: Basic Metabolic Panel:  Recent Labs Lab 04/21/14 1816 04/22/14 0518 04/23/14 0546  NA 141 141 144  K 4.3 3.6 3.9  CL 108 111 112  CO2 24 24 27   GLUCOSE 145* 112* 99  BUN 19 16 14   CREATININE 0.99 0.97 0.86  CALCIUM 9.0 7.9* 8.0*   Liver Function Tests:  Recent Labs Lab 04/21/14 1816 04/22/14 0518  AST 37 17  ALT 21 18  ALKPHOS 54 42  BILITOT 1.6* 1.0  PROT 7.4 5.6*  ALBUMIN 4.2 3.1*   No results for input(s): LIPASE, AMYLASE in the last 168 hours. No results for input(s): AMMONIA in the last 168 hours. CBC:  Recent Labs Lab 04/21/14 1816 04/22/14 0518  04/23/14 0546  WBC 24.8* 22.5* 8.6  NEUTROABS  --  20.0*  --   HGB 15.6 12.8* 12.3*  HCT 45.4 36.5* 38.1*  MCV 93.8 94.6 96.5  PLT 280 208 195   Cardiac Enzymes: No results for input(s): CKTOTAL, CKMB, CKMBINDEX, TROPONINI in the last 168 hours. BNP: BNP (last 3 results) No results for input(s): BNP in the last 8760 hours.  ProBNP (last 3 results) No results for input(s): PROBNP in the last 8760 hours.  CBG: No results for input(s): GLUCAP in the last 168 hours.   Signed:  Nicolaus Andel K  [facility] 04/23/2014, 12:06 PM

## 2014-04-23 NOTE — Evaluation (Signed)
Physical Therapy Evaluation Patient Details Name: Peter [name] MRN: [id number] DOB: June 02, 1933 Today's Date: 04/23/2014   History of Present Illness  81 yo male admitted wit sepsis, weakness. Hx of chronic back/pelvic pain, balance deficits, anxiety, HTN.   Clinical Impression  On eval, pt was supervision level assist for mobility-able to walk ~300 feet and climb 5 steps. Pt tolerated activity well. NO LOB during session. Discussed OP PT f/u for chronic back/pelvic pain issues-explained to pt that he would need to have PCP/specialist write order/referral and he could set up appt at his leisure. Will follow during stay. Encouraged ambulation in hallway with nursing/family supervision.     Follow Up Recommendations Outpatient PT (for back/pelvic pain eval/tx)    Equipment Recommendations  None recommended by PT    Recommendations for Other Services       Precautions / Restrictions Precautions Precautions: None Restrictions Weight Bearing Restrictions: No      Mobility  Bed Mobility Overal bed mobility: Modified Independent                Transfers Overall transfer level: Modified independent                  Ambulation/Gait Ambulation/Gait assistance: Supervision Ambulation Distance (Feet): 300 Feet Assistive device: None Gait Pattern/deviations: Step-through pattern     General Gait Details: No LOB during session. Pt denied lightheadedness.   Stairs Stairs: Yes Stairs assistance: Supervision Stair Management: Step to pattern;Forwards;One rail Right Number of Stairs: 5    Wheelchair Mobility    Modified Rankin (Stroke Patients Only)       Balance Overall balance assessment: No apparent balance deficits (not formally assessed)                                           Pertinent Vitals/Pain Pain Assessment: 0-10 Pain Score: 2  Pain Location: back(chronic)    Home Living Family/patient expects to be discharged to::  Private residence Living Arrangements: Spouse/significant other;Children Available Help at Discharge: Family Type of Home: House       Home Layout: Two level;Bed/bath upstairs Home Equipment: None      Prior Function Level of Independence: Independent               Hand Dominance        Extremity/Trunk Assessment   Upper Extremity Assessment: Overall WFL for tasks assessed           Lower Extremity Assessment: Overall WFL for tasks assessed      Cervical / Trunk Assessment: Normal  Communication   Communication: No difficulties  Cognition Arousal/Alertness: Awake/alert Behavior During Therapy: WFL for tasks assessed/performed Overall Cognitive Status: Within Functional Limits for tasks assessed                      General Comments      Exercises        Assessment/Plan    PT Assessment    PT Diagnosis Difficulty walking   PT Problem List Decreased mobility;Pain  PT Treatment Interventions     PT Goals (Current goals can be found in the Care Plan section) Acute Rehab PT Goals Patient Stated Goal: home soon.  PT Goal Formulation: With patient Time For Goal Achievement: 04/30/14 Potential to Achieve Goals: Good    Frequency     Barriers to discharge  Co-evaluation               End of Session Equipment Utilized During Treatment: Gait belt Activity Tolerance: Patient tolerated treatment well Patient left: in chair;with call bell/phone within reach;with family/visitor present           Time: 2103-1281 PT Time Calculation (min) (ACUTE ONLY): 27 min   Charges:   PT Evaluation $Initial PT Evaluation Tier I: 1 Procedure PT Treatments $Gait Training: 8-22 mins   PT G Codes:        Weston Anna, MPT Pager: [phone]

## 2014-04-28 LAB — CULTURE, BLOOD (ROUTINE X 2)
Culture: NO GROWTH
Culture: NO GROWTH

## 2015-05-22 DIAGNOSIS — M25562 Pain in left knee: Secondary | ICD-10-CM

## 2015-05-22 DIAGNOSIS — M25561 Pain in right knee: Secondary | ICD-10-CM

## 2015-05-22 DIAGNOSIS — G8929 Other chronic pain: Secondary | ICD-10-CM | POA: Insufficient documentation

## 2015-05-31 ENCOUNTER — Encounter (HOSPITAL_COMMUNITY): Payer: Self-pay | Admitting: Emergency Medicine

## 2015-05-31 ENCOUNTER — Encounter (HOSPITAL_COMMUNITY)
Admission: EM | Disposition: A | Discharge status: Home / Self Care | Payer: Self-pay | Source: Home / Self Care | Attending: Internal Medicine

## 2015-05-31 ENCOUNTER — Inpatient Hospital Stay (HOSPITAL_COMMUNITY): Payer: Medicare Other

## 2015-05-31 ENCOUNTER — Inpatient Hospital Stay (HOSPITAL_COMMUNITY)
Admission: EM | Admit: 2015-05-31 | Discharge: 2015-06-01 | DRG: 247 | Disposition: A | Discharge status: Home / Self Care | Payer: Medicare Other | Attending: Internal Medicine | Admitting: Internal Medicine

## 2015-05-31 DIAGNOSIS — C61 Malignant neoplasm of prostate: Secondary | ICD-10-CM | POA: Insufficient documentation

## 2015-05-31 DIAGNOSIS — Z87891 Personal history of nicotine dependence: Secondary | ICD-10-CM | POA: Diagnosis not present

## 2015-05-31 DIAGNOSIS — M549 Dorsalgia, unspecified: Secondary | ICD-10-CM | POA: Diagnosis present

## 2015-05-31 DIAGNOSIS — E7800 Pure hypercholesterolemia, unspecified: Secondary | ICD-10-CM | POA: Diagnosis present

## 2015-05-31 DIAGNOSIS — Z85828 Personal history of other malignant neoplasm of skin: Secondary | ICD-10-CM | POA: Diagnosis not present

## 2015-05-31 DIAGNOSIS — I213 ST elevation (STEMI) myocardial infarction of unspecified site: Secondary | ICD-10-CM | POA: Diagnosis not present

## 2015-05-31 DIAGNOSIS — I2119 ST elevation (STEMI) myocardial infarction involving other coronary artery of inferior wall: Secondary | ICD-10-CM | POA: Diagnosis not present

## 2015-05-31 DIAGNOSIS — Z79899 Other long term (current) drug therapy: Secondary | ICD-10-CM | POA: Diagnosis not present

## 2015-05-31 DIAGNOSIS — I2510 Atherosclerotic heart disease of native coronary artery without angina pectoris: Secondary | ICD-10-CM | POA: Diagnosis not present

## 2015-05-31 DIAGNOSIS — H269 Unspecified cataract: Secondary | ICD-10-CM | POA: Diagnosis present

## 2015-05-31 DIAGNOSIS — I10 Essential (primary) hypertension: Secondary | ICD-10-CM

## 2015-05-31 DIAGNOSIS — R972 Elevated prostate specific antigen [PSA]: Secondary | ICD-10-CM | POA: Diagnosis present

## 2015-05-31 DIAGNOSIS — M1990 Unspecified osteoarthritis, unspecified site: Secondary | ICD-10-CM | POA: Diagnosis present

## 2015-05-31 DIAGNOSIS — F419 Anxiety disorder, unspecified: Secondary | ICD-10-CM | POA: Diagnosis present

## 2015-05-31 DIAGNOSIS — I2111 ST elevation (STEMI) myocardial infarction involving right coronary artery: Secondary | ICD-10-CM

## 2015-05-31 DIAGNOSIS — K219 Gastro-esophageal reflux disease without esophagitis: Secondary | ICD-10-CM | POA: Diagnosis present

## 2015-05-31 DIAGNOSIS — Z7982 Long term (current) use of aspirin: Secondary | ICD-10-CM

## 2015-05-31 DIAGNOSIS — I25110 Atherosclerotic heart disease of native coronary artery with unstable angina pectoris: Secondary | ICD-10-CM | POA: Diagnosis not present

## 2015-05-31 DIAGNOSIS — E785 Hyperlipidemia, unspecified: Secondary | ICD-10-CM | POA: Diagnosis not present

## 2015-05-31 DIAGNOSIS — G8929 Other chronic pain: Secondary | ICD-10-CM | POA: Diagnosis present

## 2015-05-31 DIAGNOSIS — Z955 Presence of coronary angioplasty implant and graft: Secondary | ICD-10-CM

## 2015-05-31 DIAGNOSIS — R079 Chest pain, unspecified: Secondary | ICD-10-CM | POA: Diagnosis not present

## 2015-05-31 HISTORY — PX: CARDIAC CATHETERIZATION: SHX172

## 2015-05-31 HISTORY — DX: Hyperlipidemia, unspecified: E78.5

## 2015-05-31 HISTORY — DX: Sepsis, unspecified organism: A41.9

## 2015-05-31 HISTORY — DX: Encounter for screening for malignant neoplasm of prostate: Z12.5

## 2015-05-31 HISTORY — DX: Atherosclerotic heart disease of native coronary artery without angina pectoris: I25.10

## 2015-05-31 LAB — COMPREHENSIVE METABOLIC PANEL
ALBUMIN: 3.4 g/dL — AB (ref 3.5–5.0)
ALT: 14 U/L — AB (ref 17–63)
AST: 20 U/L (ref 15–41)
Alkaline Phosphatase: 52 U/L (ref 38–126)
Anion gap: 8 (ref 5–15)
BUN: 15 mg/dL (ref 6–20)
CHLORIDE: 113 mmol/L — AB (ref 101–111)
CO2: 24 mmol/L (ref 22–32)
Calcium: 9 mg/dL (ref 8.9–10.3)
Creatinine, Ser: 0.95 mg/dL (ref 0.61–1.24)
GFR calc non Af Amer: >60 mL/min (ref >60)
GLUCOSE: 119 mg/dL — AB (ref 65–99)
Potassium: 3.5 mmol/L (ref 3.5–5.1)
SODIUM: 145 mmol/L (ref 135–145)
Total Bilirubin: 0.6 mg/dL (ref 0.3–1.2)
Total Protein: 6 g/dL — ABNORMAL LOW (ref 6.5–8.1)

## 2015-05-31 LAB — TROPONIN I
TROPONIN I: 0.57 ng/mL — AB (ref <0.031)
TROPONIN I: 2.02 ng/mL — AB (ref <0.031)
Troponin I: 0.11 ng/mL — ABNORMAL HIGH (ref <0.031)
Troponin I: 1.84 ng/mL (ref <0.031)

## 2015-05-31 LAB — LIPID PANEL
CHOL/HDL RATIO: 4.3 ratio
Cholesterol: 155 mg/dL (ref 0–200)
HDL: 36 mg/dL — ABNORMAL LOW (ref >40)
LDL Cholesterol: 108 mg/dL — ABNORMAL HIGH (ref 0–99)
Triglycerides: 55 mg/dL (ref <150)
VLDL: 11 mg/dL (ref 0–40)

## 2015-05-31 LAB — ECHOCARDIOGRAM COMPLETE
Height: 72 in
Weight: 3132.3 oz

## 2015-05-31 LAB — PROTIME-INR
INR: 1.16 (ref 0.00–1.49)
Prothrombin Time: 15 seconds (ref 11.6–15.2)

## 2015-05-31 LAB — CBC
HEMATOCRIT: 42.1 % (ref 39.0–52.0)
HEMOGLOBIN: 13.8 g/dL (ref 13.0–17.0)
MCH: 31 pg (ref 26.0–34.0)
MCHC: 32.8 g/dL (ref 30.0–36.0)
MCV: 94.6 fL (ref 78.0–100.0)
PLATELETS: 240 10*3/uL (ref 150–400)
RBC: 4.45 MIL/uL (ref 4.22–5.81)
RDW: 12.5 % (ref 11.5–15.5)
WBC: 9.6 10*3/uL (ref 4.0–10.5)

## 2015-05-31 LAB — MRSA PCR SCREENING: MRSA by PCR: NEGATIVE

## 2015-05-31 LAB — POCT ACTIVATED CLOTTING TIME: ACTIVATED CLOTTING TIME: 554 s

## 2015-05-31 LAB — TSH: TSH: 1.424 u[IU]/mL (ref 0.350–4.500)

## 2015-05-31 SURGERY — LEFT HEART CATH AND CORONARY ANGIOGRAPHY
Anesthesia: LOCAL

## 2015-05-31 MED ORDER — TICAGRELOR 90 MG PO TABS: 90.0000 mg | ORAL_TABLET | Freq: Two times a day (BID) | ORAL | Status: DC

## 2015-05-31 MED ORDER — FENTANYL CITRATE (PF) 100 MCG/2ML IJ SOLN
INTRAMUSCULAR | Status: DC | PRN
  Administered 2015-05-31: 25 ug via INTRAVENOUS

## 2015-05-31 MED ORDER — SODIUM CHLORIDE 0.9 % IV SOLN: INTRAVENOUS | Status: AC

## 2015-05-31 MED ORDER — VERAPAMIL HCL 2.5 MG/ML IV SOLN
INTRAVENOUS | Status: DC | PRN
  Administered 2015-05-31: 10 mL via INTRA_ARTERIAL

## 2015-05-31 MED ORDER — ASPIRIN EC 81 MG PO TBEC: 81.0000 mg | DELAYED_RELEASE_TABLET | Freq: Every day | ORAL | Status: DC

## 2015-05-31 MED ORDER — POTASSIUM CHLORIDE CRYS ER 20 MEQ PO TBCR
40.0000 meq | EXTENDED_RELEASE_TABLET | Freq: Once | ORAL | Status: AC
  Administered 2015-05-31: 40 meq via ORAL
  Filled 2015-05-31: qty 2

## 2015-05-31 MED ORDER — ONDANSETRON HCL 4 MG/2ML IJ SOLN: 4.0000 mg | Freq: Four times a day (QID) | INTRAMUSCULAR | Status: DC | PRN

## 2015-05-31 MED ORDER — ROSUVASTATIN CALCIUM 10 MG PO TABS
10.0000 mg | ORAL_TABLET | Freq: Every day | ORAL | Status: DC
Start: 2015-05-31 — End: 2015-06-01
  Administered 2015-05-31 – 2015-06-01 (×2): 10 mg via ORAL
  Filled 2015-05-31 (×2): qty 1

## 2015-05-31 MED ORDER — HEPARIN SODIUM (PORCINE) 1000 UNIT/ML IJ SOLN
INTRAMUSCULAR | Status: AC
  Filled 2015-05-31: qty 1

## 2015-05-31 MED ORDER — LIDOCAINE HCL (PF) 1 % IJ SOLN
INTRAMUSCULAR | Status: DC | PRN
  Administered 2015-05-31: 5 mL

## 2015-05-31 MED ORDER — BIVALIRUDIN BOLUS VIA INFUSION - CUPID
INTRAVENOUS | Status: DC | PRN
  Administered 2015-05-31: 68.025 mg via INTRAVENOUS

## 2015-05-31 MED ORDER — SODIUM CHLORIDE 0.9 % IV SOLN: 250.0000 mL | INTRAVENOUS | Status: DC | PRN

## 2015-05-31 MED ORDER — GABAPENTIN 100 MG PO CAPS
100.0000 mg | ORAL_CAPSULE | Freq: Three times a day (TID) | ORAL | Status: DC
  Administered 2015-05-31 – 2015-06-01 (×5): 100 mg via ORAL
  Filled 2015-05-31 (×5): qty 1

## 2015-05-31 MED ORDER — HEPARIN (PORCINE) IN NACL 2-0.9 UNIT/ML-% IJ SOLN
INTRAMUSCULAR | Status: DC | PRN
  Administered 2015-05-31: 04:00:00

## 2015-05-31 MED ORDER — HEPARIN SODIUM (PORCINE) 1000 UNIT/ML IJ SOLN
INTRAMUSCULAR | Status: DC | PRN
  Administered 2015-05-31: 5000 [IU] via INTRAVENOUS

## 2015-05-31 MED ORDER — ACETAMINOPHEN 325 MG PO TABS
650.0000 mg | ORAL_TABLET | ORAL | Status: DC | PRN
  Administered 2015-05-31 – 2015-06-01 (×2): 650 mg via ORAL
  Filled 2015-05-31 (×2): qty 2

## 2015-05-31 MED ORDER — MIDAZOLAM HCL 2 MG/2ML IJ SOLN
INTRAMUSCULAR | Status: AC
  Filled 2015-05-31: qty 2

## 2015-05-31 MED ORDER — MIDAZOLAM HCL 2 MG/2ML IJ SOLN
INTRAMUSCULAR | Status: DC | PRN
  Administered 2015-05-31: 1 mg via INTRAVENOUS

## 2015-05-31 MED ORDER — IOPAMIDOL (ISOVUE-370) INJECTION 76%
INTRAVENOUS | Status: AC
  Filled 2015-05-31: qty 100

## 2015-05-31 MED ORDER — METOPROLOL TARTRATE 12.5 MG HALF TABLET
12.5000 mg | ORAL_TABLET | Freq: Two times a day (BID) | ORAL | Status: DC
  Administered 2015-05-31 – 2015-06-01 (×3): 12.5 mg via ORAL
  Filled 2015-05-31 (×4): qty 1

## 2015-05-31 MED ORDER — NITROGLYCERIN 0.4 MG SL SUBL: 0.4000 mg | SUBLINGUAL_TABLET | SUBLINGUAL | Status: DC | PRN

## 2015-05-31 MED ORDER — NITROGLYCERIN 1 MG/10 ML FOR IR/CATH LAB
INTRA_ARTERIAL | Status: AC
  Filled 2015-05-31: qty 10

## 2015-05-31 MED ORDER — HEPARIN SODIUM (PORCINE) 5000 UNIT/ML IJ SOLN
4000.0000 [IU] | Freq: Once | INTRAMUSCULAR | Status: AC
  Administered 2015-05-31: 4000 [IU] via INTRAVENOUS

## 2015-05-31 MED ORDER — TICAGRELOR 90 MG PO TABS
ORAL_TABLET | ORAL | Status: AC
  Filled 2015-05-31: qty 2

## 2015-05-31 MED ORDER — BIVALIRUDIN 250 MG IV SOLR
INTRAVENOUS | Status: AC
  Filled 2015-05-31: qty 250

## 2015-05-31 MED ORDER — ACETAMINOPHEN 325 MG PO TABS: 650.0000 mg | ORAL_TABLET | ORAL | Status: DC | PRN

## 2015-05-31 MED ORDER — FENTANYL CITRATE (PF) 100 MCG/2ML IJ SOLN
INTRAMUSCULAR | Status: AC
  Filled 2015-05-31: qty 2

## 2015-05-31 MED ORDER — SODIUM CHLORIDE 0.9% FLUSH: 3.0000 mL | INTRAVENOUS | Status: DC | PRN

## 2015-05-31 MED ORDER — IOPAMIDOL (ISOVUE-370) INJECTION 76%
INTRAVENOUS | Status: DC | PRN
  Administered 2015-05-31: 140 mL

## 2015-05-31 MED ORDER — LIDOCAINE HCL (PF) 1 % IJ SOLN
INTRAMUSCULAR | Status: AC
  Filled 2015-05-31: qty 30

## 2015-05-31 MED ORDER — VERAPAMIL HCL 2.5 MG/ML IV SOLN
INTRAVENOUS | Status: AC
  Filled 2015-05-31: qty 2

## 2015-05-31 MED ORDER — TICAGRELOR 90 MG PO TABS
90.0000 mg | ORAL_TABLET | Freq: Two times a day (BID) | ORAL | Status: DC
  Administered 2015-05-31 – 2015-06-01 (×2): 90 mg via ORAL
  Filled 2015-05-31 (×3): qty 1

## 2015-05-31 MED ORDER — ENOXAPARIN SODIUM 40 MG/0.4ML ~~LOC~~ SOLN
40.0000 mg | SUBCUTANEOUS | Status: DC
  Administered 2015-05-31 – 2015-06-01 (×2): 40 mg via SUBCUTANEOUS
  Filled 2015-05-31 (×2): qty 0.4

## 2015-05-31 MED ORDER — HYPROMELLOSE (GONIOSCOPIC) 2.5 % OP SOLN: 1.0000 [drp] | Freq: Three times a day (TID) | OPHTHALMIC | Status: DC | PRN

## 2015-05-31 MED ORDER — POLYETHYLENE GLYCOL 3350 17 G PO PACK
17.0000 g | PACK | Freq: Every day | ORAL | Status: DC | PRN
Start: 2015-05-31 — End: 2015-06-01

## 2015-05-31 MED ORDER — IRBESARTAN 75 MG PO TABS
37.5000 mg | ORAL_TABLET | Freq: Every day | ORAL | Status: DC
  Administered 2015-06-01: 37.5 mg via ORAL
  Filled 2015-05-31 (×3): qty 0.5

## 2015-05-31 MED ORDER — HEPARIN (PORCINE) IN NACL 2-0.9 UNIT/ML-% IJ SOLN
INTRAMUSCULAR | Status: AC
  Filled 2015-05-31: qty 1000

## 2015-05-31 MED ORDER — SODIUM CHLORIDE 0.9 % IV SOLN
250.0000 mg | INTRAVENOUS | Status: DC | PRN
  Administered 2015-05-31: 1.75 mg/kg/h via INTRAVENOUS

## 2015-05-31 MED ORDER — TICAGRELOR 90 MG PO TABS
ORAL_TABLET | ORAL | Status: DC | PRN
  Administered 2015-05-31: 180 mg via ORAL

## 2015-05-31 MED ORDER — NITROGLYCERIN 1 MG/10 ML FOR IR/CATH LAB
INTRA_ARTERIAL | Status: DC | PRN
  Administered 2015-05-31: 400 ug via INTRA_ARTERIAL

## 2015-05-31 MED ORDER — SODIUM CHLORIDE 0.9 % IV SOLN
1.7500 mg/kg/h | INTRAVENOUS | Status: AC
  Filled 2015-05-31: qty 250

## 2015-05-31 MED ORDER — ALPRAZOLAM 0.25 MG PO TABS
0.2500 mg | ORAL_TABLET | Freq: Every evening | ORAL | Status: DC | PRN
  Administered 2015-05-31: 0.25 mg via ORAL
  Filled 2015-05-31: qty 1

## 2015-05-31 MED ORDER — ASPIRIN 81 MG PO CHEW
81.0000 mg | CHEWABLE_TABLET | Freq: Every day | ORAL | Status: DC
  Administered 2015-05-31 – 2015-06-01 (×2): 81 mg via ORAL
  Filled 2015-05-31 (×2): qty 1

## 2015-05-31 MED ORDER — SODIUM CHLORIDE 0.9% FLUSH
3.0000 mL | Freq: Two times a day (BID) | INTRAVENOUS | Status: DC
  Administered 2015-05-31 – 2015-06-01 (×3): 3 mL via INTRAVENOUS

## 2015-05-31 MED ORDER — PANTOPRAZOLE SODIUM 40 MG PO TBEC
40.0000 mg | DELAYED_RELEASE_TABLET | Freq: Every day | ORAL | Status: DC
  Administered 2015-05-31 – 2015-06-01 (×2): 40 mg via ORAL
  Filled 2015-05-31 (×2): qty 1

## 2015-05-31 SURGICAL SUPPLY — 22 items
BALLN EMERGE MR 2.0X12 (BALLOONS) ×2
BALLN ~~LOC~~ EUPHORA RX 3.0X8 (BALLOONS) ×2
BALLOON EMERGE MR 2.0X12 (BALLOONS) ×1 IMPLANT
BALLOON ~~LOC~~ EUPHORA RX 3.0X8 (BALLOONS) ×1 IMPLANT
CATH INFINITI 5 FR JL3.5 (CATHETERS) ×2 IMPLANT
CATH INFINITI JR4 5F (CATHETERS) ×2 IMPLANT
CATH LAUNCHER 5F EBU3.0 (CATHETERS) ×1 IMPLANT
CATHETER LAUNCHER 5F EBU3.0 (CATHETERS) ×2
DEVICE RAD COMP TR BAND LRG (VASCULAR PRODUCTS) ×2 IMPLANT
GLIDESHEATH SLEND SS 6F .021 (SHEATH) ×2 IMPLANT
GUIDE CATH RUNWAY 6FR FR4 (CATHETERS) ×2 IMPLANT
KIT ENCORE 26 ADVANTAGE (KITS) ×2 IMPLANT
KIT HEART LEFT (KITS) ×2 IMPLANT
PACK CARDIAC CATHETERIZATION (CUSTOM PROCEDURE TRAY) ×2 IMPLANT
STENT SYNERGY DES 2.5X16 (Permanent Stent) ×2 IMPLANT
TRANSDUCER W/STOPCOCK (MISCELLANEOUS) ×2 IMPLANT
TUBING CIL FLEX 10 FLL-RA (TUBING) ×2 IMPLANT
VALVE GUARDIAN II ~~LOC~~ HEMO (MISCELLANEOUS) ×2 IMPLANT
WIRE ASAHI PROWATER 180CM (WIRE) ×2 IMPLANT
WIRE HI TORQ BMW 190CM (WIRE) ×2 IMPLANT
WIRE HI TORQ VERSACORE-J 145CM (WIRE) ×2 IMPLANT
WIRE J 3MM .035X260CM (WIRE) ×2 IMPLANT

## 2015-05-31 NOTE — ED Provider Notes (Signed)
CSN: [id number]     Arrival date & time 05/31/15  0254 History  By signing my name below, I, Altamease Oiler, attest that this documentation has been prepared under the direction and in the presence of Orpah Greek, MD. Electronically Signed: Altamease Oiler, ED Scribe. 05/31/2015. 3:18 AM   No chief complaint on file.   The history is provided by the patient and the EMS personnel. No language interpreter was used.   Brought in by EMS as a Code STEMI, ROARKE DUTKIEWICZ II is a 82 y.o. male with history of HTN and high cholesterol who presents to the Emergency Department complaining of constant bilateral chest pain with onset 6 hours ago after heavy lifting. He is unable to describe the pain apart from saying "it hurts, bad".  The pain radiates down both arms and to the back. He was given 324 mg of aspirin and 2 NTG by EMS.   Past Medical History  Diagnosis Date  . Arthritis   . Cataract   . Prostate disease   . Skin cancer   . Hypertension   . Shoulder pain   . Reflux   . Chronic back pain   . Loss of balance   . HLD (hyperlipidemia)   . Anxiety   . Sepsis [facility]) February 2016    Pt stated he was treated at [facility] for this   . Prostate cancer screening     "for several years"  . CAD (coronary artery disease)     a. 05/2015: STEMI: 99% stenosis of Posterolateral w/ Synergy DES placed.   Past Surgical History  Procedure Laterality Date  . Vasectomy    . Appendectomy    . Colon surgery    . Hand surgery Left   . Cardiac catheterization N/A 05/31/2015    Procedure: Left Heart Cath and Coronary Angiography;  Surgeon: Jettie Booze, MD;  Location: [place] [facility];  Service: Cardiovascular;  Laterality: N/A;  . Cardiac catheterization N/A 05/31/2015    Procedure: Coronary Stent Intervention;  Surgeon: Jettie Booze, MD;  Location: [place] [facility];  Service: Cardiovascular;  Laterality: N/A;   Family History  Problem Relation Age of Onset  . Stroke  Mother   . Prostate cancer Father   . Breast cancer Daughter   . ALS Brother    Social History  Substance Use Topics  . Smoking status: Former Smoker -- 1.00 packs/day for 10 years    Types: Cigarettes    Quit date: 03/05/1972  . Smokeless tobacco: Never Used  . Alcohol Use: 0.6 oz/week    1 Cans of beer per week     Comment: Once a month    Review of Systems  Cardiovascular: Positive for chest pain.  All other systems reviewed and are negative.  Allergies  Doxycycline; Levaquin; Lipitor; Lisinopril; Niaspan; Penicillins; Sulfa antibiotics; Vytorin; Clindamycin/lincomycin; and Zithromax  Home Medications   Prior to Admission medications   Medication Sig Start Date End Date Taking? Authorizing Provider  acetaminophen (TYLENOL) 500 MG tablet Take 1,000 mg by mouth at bedtime.   Yes Historical Provider, MD  ALPRAZolam Duanne Moron) 0.5 MG tablet Take 0.25 mg by mouth at bedtime as needed for anxiety.    Yes Historical Provider, MD  aspirin EC 81 MG tablet Take 81 mg by mouth daily.   Yes Historical Provider, MD  fluticasone (FLONASE) 50 MCG/ACT nasal spray Place 1 spray into the nose daily as needed for allergies.  01/28/13  Yes Historical Provider,  MD  hydroxypropyl methylcellulose / hypromellose (ISOPTO TEARS / GONIOVISC) 2.5 % ophthalmic solution Place 1 drop into both eyes 3 (three) times daily as needed for dry eyes.   Yes Historical Provider, MD  omeprazole (PRILOSEC) 20 MG capsule Take 20 mg by mouth daily.    Yes Historical Provider, MD  polyethylene glycol (MIRALAX) packet Take 17 g by mouth daily as needed for moderate constipation.    Yes Historical Provider, MD  TURMERIC PO Take 1 capsule by mouth daily.   Yes Historical Provider, MD  valsartan (DIOVAN) 40 MG tablet Take 40 mg by mouth daily.   Yes Historical Provider, MD  metoprolol tartrate (LOPRESSOR) 25 MG tablet Take 0.5 tablets (12.5 mg total) by mouth 2 (two) times daily. 06/01/15   Erma Heritage, PA  nitroGLYCERIN  (NITROSTAT) 0.4 MG SL tablet Place 1 tablet (0.4 mg total) under the tongue every 5 (five) minutes x 3 doses as needed for chest pain. 06/01/15   Erma Heritage, PA  rosuvastatin (CRESTOR) 10 MG tablet Take 1 tablet (10 mg total) by mouth daily at 6 PM. 06/01/15   Erma Heritage, PA  ticagrelor (BRILINTA) 90 MG TABS tablet Take 1 tablet (90 mg total) by mouth 2 (two) times daily. 06/01/15   Fransisco Hertz Strader, PA   BP 122/79 mmHg  Pulse 55  Temp(Src) 97.7 F (36.5 C) (Oral)  Resp 16  Ht 6' (1.829 m)  Wt 200 lb (90.719 kg)  BMI 27.12 kg/m2  SpO2 93% Physical Exam  Constitutional: He is oriented to person, place, and time. He appears well-developed and well-nourished. No distress.  HENT:  Head: Normocephalic and atraumatic.  Right Ear: Hearing normal.  Left Ear: Hearing normal.  Nose: Nose normal.  Mouth/Throat: Oropharynx is clear and moist and mucous membranes are normal.  Eyes: Conjunctivae and EOM are normal. Pupils are equal, round, and reactive to light.  Neck: Normal range of motion. Neck supple.  Cardiovascular: Regular rhythm, S1 normal and S2 normal.  Exam reveals no gallop and no friction rub.   No murmur heard. Pulmonary/Chest: Effort normal and breath sounds normal. No respiratory distress. He exhibits no tenderness.  Abdominal: Soft. Normal appearance and bowel sounds are normal. There is no hepatosplenomegaly. There is no tenderness. There is no rebound, no guarding, no tenderness at McBurney's point and negative Murphy's sign. No hernia.  Musculoskeletal: Normal range of motion.  Neurological: He is alert and oriented to person, place, and time. He has normal strength. No cranial nerve deficit or sensory deficit. Coordination normal. GCS eye subscore is 4. GCS verbal subscore is 5. GCS motor subscore is 6.  Skin: Skin is warm and intact. No rash noted. He is diaphoretic. No cyanosis. There is pallor.  Psychiatric: He has a normal mood and affect. His speech is normal  and behavior is normal. Thought content normal.  Nursing note and vitals reviewed.   ED Course  Procedures (including critical care time) DIAGNOSTIC STUDIES: Oxygen Saturation is 93% on RA, adequate by my interpretation.    COORDINATION OF CARE: 2:57 AM Discussed treatment plan which includes lab work and EKG with pt at bedside and pt agreed to plan. Dr. Philbert Riser (Cardiology Fellow) is at the bedside.   Labs Review Labs Reviewed  COMPREHENSIVE METABOLIC PANEL - Abnormal; Notable for the following:    Chloride 113 (*)    Glucose, Bld 119 (*)    Total Protein 6.0 (*)    Albumin 3.4 (*)    ALT 14 (*)  All other components within normal limits  TROPONIN I - Abnormal; Notable for the following:    Troponin I 0.11 (*)    All other components within normal limits  TROPONIN I - Abnormal; Notable for the following:    Troponin I 0.57 (*)    All other components within normal limits  TROPONIN I - Abnormal; Notable for the following:    Troponin I 1.84 (*)    All other components within normal limits  TROPONIN I - Abnormal; Notable for the following:    Troponin I 2.02 (*)    All other components within normal limits  HEMOGLOBIN A1C - Abnormal; Notable for the following:    Hgb A1c MFr Bld 5.8 (*)    All other components within normal limits  LIPID PANEL - Abnormal; Notable for the following:    HDL 36 (*)    LDL Cholesterol 108 (*)    All other components within normal limits  BASIC METABOLIC PANEL - Abnormal; Notable for the following:    Chloride 114 (*)    CO2 21 (*)    Calcium 8.5 (*)    All other components within normal limits  MRSA PCR SCREENING  PROTIME-INR  CBC  TSH  CBC  POCT ACTIVATED CLOTTING TIME    Imaging Review No results found. I have personally reviewed and evaluated these lab results as part of my medical decision-making.   EKG Interpretation   Date/Time:  Tuesday May 31 2015 03:03:04 EDT Ventricular Rate:  54 PR Interval:  211 QRS Duration:  102 QT Interval:  431 QTC Calculation: 408 R Axis:   -20 Text Interpretation:  Sinus rhythm Inferior infarct, acute (RCA) Probable  RV involvement, suggest recording right precordial leads ED PHYSICIAN  INTERPRETATION AVAILABLE IN [place] Confirmed by TEST, Record  ([id number]) on 06/01/2015 6:35:15 AM      MDM   Final diagnoses:  Stented coronary artery  ST elevation myocardial infarction (STEMI) of inferior wall [facility])  STEMI  Patient presented to the ER for evaluation of acute onset chest pain. Patient with pallor, diaphoresis and crushing chest pain associated with inferior ST elevation on EKG during transport. EKG at arrival to the ER shows improving ST segments, but patient presentation consistent with MI and will go directly to the Cath Lab.  CRITICAL CARE Performed by: Orpah Greek   Total critical care time: 30 minutes  Critical care time was exclusive of separately billable procedures and treating other patients.  Critical care was necessary to treat or prevent imminent or life-threatening deterioration.  Critical care was time spent personally by me on the following activities: development of treatment plan with patient and/or surrogate as well as nursing, discussions with consultants, evaluation of patient's response to treatment, examination of patient, obtaining history from patient or surrogate, ordering and performing treatments and interventions, ordering and review of laboratory studies, ordering and review of radiographic studies, pulse oximetry and re-evaluation of patient's condition.   I personally performed the services described in this documentation, which was scribed in my presence. The recorded information has been reviewed and is accurate.    Orpah Greek, MD 06/02/15 [place]

## 2015-05-31 NOTE — Care Management Note (Signed)
Case Management Note  Patient Details  Name: Peter [name] MRN: [id number] Date of Birth: November 12, 1933  Subjective/Objective:        Adm w mi            Action/Plan: lives w wife, pcp dr badger  Expected Discharge Date:                  Expected Discharge Plan:  Home/Self Care  In-House Referral:     Discharge planning Services  CM Consult, Medication Assistance  Post Acute Care Choice:    Choice offered to:     DME Arranged:    DME Agency:     HH Arranged:    [place] Agency:     Status of Service:     Medicare Important Message Given:    Date Medicare IM Given:    Medicare IM give by:    Date Additional Medicare IM Given:    Additional Medicare Important Message give by:     If discussed at [place] of Stay Meetings, dates discussed:    Additional Comments: ur review done. Left pt 30day free brilinta card.  Lacretia Leigh, RN 05/31/2015, 10:37 AM

## 2015-05-31 NOTE — Progress Notes (Signed)
Echocardiogram 2D Echocardiogram has been performed.  Peter [name] 05/31/2015, 3:22 PM

## 2015-05-31 NOTE — H&P (Addendum)
HPI: 82 yo man with HTN, HLD, and elevated PSA with some concern for prostate CA presenting with inferior STEMI.  Developed CP a couple of hours before arriving.  Described as "hurts".  Severe in nature.  On EMS arrival he was pale and diaphoretic.  Pain still present and severe.  Repeat ECG en route revealed inferior STE with reciprocal changes, still present.  He reports having an elevated PSA with f/u Wed and if still elevated will need biopsy.    Review of Systems:     Cardiac Review of Systems: {Y] = yes [ ]  = no  Chest Pain [    ]  Resting SOB [   ] Exertional SOB  [  ]  Orthopnea [  ]   Pedal Edema [   ]    Palpitations [  ] Syncope  [  ]   Presyncope [   ]  General Review of Systems: [Y] = yes [  ]=no Constitional: recent weight change [  ]; anorexia [  ]; fatigue [  ]; nausea [  ]; night sweats [  ]; fever [  ]; or chills [  ];                                                                      Dental: poor dentition[  ];   Eye : blurred vision [  ]; diplopia [   ]; vision changes [  ];  Amaurosis fugax[  ]; Resp: cough [  ];  wheezing[  ];  hemoptysis[  ]; shortness of breath[  ]; paroxysmal nocturnal dyspnea[  ]; dyspnea on exertion[  ]; or orthopnea[  ];  GI:  gallstones[  ], vomiting[  ];  dysphagia[  ]; melena[  ];  hematochezia [  ]; heartburn[  ];   GU: kidney stones [  ]; hematuria[  ];   dysuria [  ];  nocturia[  ];               Skin: rash [  ], swelling[  ];, hair loss[  ];  peripheral edema[  ];  or itching[  ]; Musculosketetal: myalgias[  ];  joint swelling[  ];  joint erythema[  ];  joint pain[  ];  back pain[  ];  Heme/Lymph: bruising[  ];  bleeding[  ];  anemia[  ];  Neuro: TIA[  ];  headaches[  ];  stroke[  ];  vertigo[  ];  seizures[  ];   paresthesias[  ];  difficulty walking[  ];  Psych:depression[  ]; anxiety[  ];  Endocrine: diabetes[  ];  thyroid dysfunction[  ];  Other:  Past Medical History  Diagnosis Date  . Arthritis   . Cataract   . Prostate  disease   . Skin cancer   . Hypertension   . Shoulder pain   . Reflux   . Chronic back pain   . Loss of balance   . High cholesterol   . Anxiety     No current facility-administered medications on file prior to encounter.   Current Outpatient Prescriptions on File Prior to Encounter  Medication Sig Dispense Refill  . acetaminophen (TYLENOL) 500 MG tablet Take 1,000 mg by mouth at  bedtime.    . ALPRAZolam (XANAX) 0.5 MG tablet Take 0.25 mg by mouth at bedtime as needed for anxiety.     Marland Kitchen aspirin EC 81 MG tablet Take 81 mg by mouth daily.    . cefUROXime (CEFTIN) 250 MG tablet Take 1 tablet (250 mg total) by mouth 2 (two) times daily with a meal. 8 tablet 0  . fluticasone (FLONASE) 50 MCG/ACT nasal spray Place 1 spray into the nose daily as needed for allergies.     Marland Kitchen gabapentin (NEURONTIN) 100 MG capsule Take 1 capsule (100 mg total) by mouth 3 (three) times daily. (Patient not taking: Reported on 04/21/2014) 90 capsule 11  . hydroxypropyl methylcellulose / hypromellose (ISOPTO TEARS / GONIOVISC) 2.5 % ophthalmic solution Place 1 drop into both eyes 3 (three) times daily as needed for dry eyes.    . metroNIDAZOLE (FLAGYL) 500 MG tablet Take 1 tablet (500 mg total) by mouth 3 (three) times daily. 12 tablet 0  . omeprazole (PRILOSEC) 20 MG capsule Take 20 mg by mouth daily as needed (heart burn).     . polyethylene glycol (MIRALAX) packet Take 17 g by mouth daily as needed for moderate constipation.     . valsartan (DIOVAN) 40 MG tablet Take 40 mg by mouth daily.       Allergies  Allergen Reactions  . Doxycycline Itching  . Levaquin [Levofloxacin In D5w] Other (See Comments)    Joint swelling  . Lipitor [Atorvastatin] Nausea And Vomiting  . Lisinopril Other (See Comments)    unknown  . Niaspan [Niacin] Other (See Comments)    Joint swelling  . Penicillins Swelling    2/19 Pt states he had Penicillin challenge that was negative for reaction.  . Sulfa Antibiotics Itching  .  Vytorin [Ezetimibe-Simvastatin] Nausea And Vomiting  . Clindamycin/Lincomycin Rash  . Zithromax [Azithromycin] Rash    Social History   Social History  . Marital Status: Married    Spouse Name: [name]  . Number of Children: 2  . Years of Education: college   Occupational History  . Retired- Network engineer work     Social History Main Topics  . Smoking status: Former Smoker -- 1.00 packs/day for 10 years    Types: Cigarettes    Quit date: 03/05/1972  . Smokeless tobacco: Never Used  . Alcohol Use: 0.6 oz/week    1 Cans of beer per week     Comment: Once a month  . Drug Use: No  . Sexual Activity: Yes   Other Topics Concern  . Not on file   Social History Narrative   Patient lives at home with his spouse. [name])   Caffeine Use: 2 cups of coffee daily   Retired    [employer]   Right handed    Family History  Problem Relation Age of Onset  . Stroke Mother   . Prostate cancer Father   . Breast cancer Daughter   . ALS Brother     PHYSICAL EXAM: Filed Vitals:   05/31/15 0302  BP: 122/79  Pulse: 55  Temp: 97.7 F (36.5 C)  Resp: 16   General:  Well appearing. No respiratory difficulty HEENT: normal Neck: supple. no JVD. Carotids 2+ bilat; no bruits. No lymphadenopathy or thryomegaly appreciated. Cor: PMI nondisplaced. Regular rate & rhythm. No rubs, gallops or murmurs. Lungs: clear Abdomen: soft, nontender, nondistended. No hepatosplenomegaly. No bruits or masses. Good bowel sounds. Extremities: no cyanosis, clubbing, rash, edema Neuro: alert & oriented x 3, cranial nerves  grossly intact. moves all 4 extremities w/o difficulty. Affect pleasant.  ECG: SR, inferior STE with reciprocal changes  Results for orders placed or performed during the hospital encounter of 05/31/15 (from the past 24 hour(s))  Protime-INR     Status: None   Collection Time: 05/31/15  3:02 AM  Result Value Ref Range   Prothrombin Time 15.0 11.6 - 15.2 seconds   INR 1.16 0.00 - 1.49    CBC     Status: None   Collection Time: 05/31/15  3:02 AM  Result Value Ref Range   WBC 9.6 4.0 - 10.5 K/uL   RBC 4.45 4.22 - 5.81 MIL/uL   Hemoglobin 13.8 13.0 - 17.0 g/dL   HCT 42.1 39.0 - 52.0 %   MCV 94.6 78.0 - 100.0 fL   MCH 31.0 26.0 - 34.0 pg   MCHC 32.8 30.0 - 36.0 g/dL   RDW 12.5 11.5 - 15.5 %   Platelets 240 150 - 400 K/uL   No results found.   ASSESSMENT: 82 yo man with HTN, HLD, and elevated PSA with some concern for prostate CA presenting with inferior STEMI  PLAN/DISCUSSION: Emergently to cath lab:  RPLV treated with PCI ASA, ticagrelor No statin ordered due to atorvastatin allergy listed Metoprolol 12.5 bid Cycle trop to peak Echo in AM Risk stratify: A1c, lipid, TSH Continue home ARB for HTN Need to discuss reaction to Lipitor and consider alternative statin   Alphia Moh, MD   I have examined the patient and reviewed assessment and plan and discussed with patient.  Agree with above as stated.  Acute inferior MI.  Due to prostate cancer and need for biopsy, will plan for Synergy DES if stent is needed.  THis should minimize the duration of needed DAPT.  He does not remember what happened with Lipitor in the past.  He has never had a stroke before.    Raynaldo Falco S.

## 2015-05-31 NOTE — ED Notes (Signed)
4000 units heparin given at this time

## 2015-05-31 NOTE — Progress Notes (Signed)
     SUBJECTIVE: No chest pain. Mild dyspnea.   Tele: sinus brady  BP 127/58 mmHg  Pulse 51  Temp(Src) 97.5 F (36.4 C) (Oral)  Resp 20  Ht 6' (1.829 m)  Wt 195 lb 12.3 oz (88.8 kg)  BMI 26.55 kg/m2  SpO2 97%  Intake/Output Summary (Last 24 hours) at 05/31/15 0754 Last data filed at 05/31/15 0700  Gross per 24 hour  Intake 253.33 ml  Output    900 ml  Net -646.67 ml    PHYSICAL EXAM General: Well developed, well nourished, in no acute distress. Alert and oriented x 3.  Psych:  Good affect, responds appropriately Neck: No JVD. No masses noted.  Lungs: Clear bilaterally with no wheezes or rhonci noted.  Heart: Regular, brady with no murmurs noted. Abdomen: Bowel sounds are present. Soft, non-tender.  Extremities: No lower extremity edema.   LABS: Basic Metabolic Panel:  Recent Labs  05/31/15 0302  NA 145  K 3.5  CL 113*  CO2 24  GLUCOSE 119*  BUN 15  CREATININE 0.95  CALCIUM 9.0   CBC:  Recent Labs  05/31/15 0302  WBC 9.6  HGB 13.8  HCT 42.1  MCV 94.6  PLT 240   Cardiac Enzymes:  Recent Labs  05/31/15 0302 05/31/15 0532  TROPONINI 0.11* 0.57*   Fasting Lipid Panel:  Recent Labs  05/31/15 0531  CHOL 155  HDL 36*  LDLCALC 108*  TRIG 55  CHOLHDL 4.3    Current Meds: . aspirin  81 mg Oral Daily  . enoxaparin (LOVENOX) injection  40 mg Subcutaneous Q24H  . gabapentin  100 mg Oral TID  . irbesartan  37.5 mg Oral Daily  . metoprolol tartrate  12.5 mg Oral BID  . pantoprazole  40 mg Oral Daily  . rosuvastatin  10 mg Oral q1800  . sodium chloride flush  3 mL Intravenous Q12H  . ticagrelor  90 mg Oral BID    ASSESSMENT AND PLAN:  1. CAD/Inferior STEMI: Pt admitted with inferior STEMI last night. The posterolateral branch was sub-totally occluded with poor flow. A DES was placed. He is on ASA and Brilinta. He is on a beta blocker, statin and ARB. He is stable this am. Echo today.   2. Prostate cancer: Pt had planned biopsy. Will need  to delay given need for DAPT.      Fredie Majano  3/28/20177:54 AM

## 2015-05-31 NOTE — ED Notes (Signed)
Pt escorted to cath lab by this RN and Dr. Sharrell Ku.

## 2015-05-31 NOTE — ED Notes (Signed)
Pt arrives via EMS with CP onset 2100 after heavy lifting, pale and diaphoretic upon EMS arrival. Pain radiates to back. 324MG  asa and 2SL nitro on board. EKG worsened in transport

## 2015-06-01 ENCOUNTER — Encounter (HOSPITAL_COMMUNITY): Payer: Self-pay | Admitting: Student

## 2015-06-01 DIAGNOSIS — I25110 Atherosclerotic heart disease of native coronary artery with unstable angina pectoris: Secondary | ICD-10-CM

## 2015-06-01 LAB — CBC
HCT: 40.1 % (ref 39.0–52.0)
Hemoglobin: 13.4 g/dL (ref 13.0–17.0)
MCH: 31.4 pg (ref 26.0–34.0)
MCHC: 33.4 g/dL (ref 30.0–36.0)
MCV: 93.9 fL (ref 78.0–100.0)
PLATELETS: 190 10*3/uL (ref 150–400)
RBC: 4.27 MIL/uL (ref 4.22–5.81)
RDW: 12.7 % (ref 11.5–15.5)
WBC: 8 10*3/uL (ref 4.0–10.5)

## 2015-06-01 LAB — HEMOGLOBIN A1C
Hgb A1c MFr Bld: 5.8 % — ABNORMAL HIGH (ref 4.8–5.6)
Mean Plasma Glucose: 120 mg/dL

## 2015-06-01 LAB — BASIC METABOLIC PANEL
ANION GAP: 8 (ref 5–15)
BUN: 11 mg/dL (ref 6–20)
CALCIUM: 8.5 mg/dL — AB (ref 8.9–10.3)
CO2: 21 mmol/L — ABNORMAL LOW (ref 22–32)
Chloride: 114 mmol/L — ABNORMAL HIGH (ref 101–111)
Creatinine, Ser: 0.84 mg/dL (ref 0.61–1.24)
Glucose, Bld: 99 mg/dL (ref 65–99)
Potassium: 3.7 mmol/L (ref 3.5–5.1)
Sodium: 143 mmol/L (ref 135–145)

## 2015-06-01 MED ORDER — TICAGRELOR 90 MG PO TABS: 90.0000 mg | ORAL_TABLET | Freq: Two times a day (BID) | ORAL | Status: DC

## 2015-06-01 MED ORDER — ROSUVASTATIN CALCIUM 10 MG PO TABS: 10.0000 mg | ORAL_TABLET | Freq: Every day | ORAL | Status: DC

## 2015-06-01 MED ORDER — METOPROLOL TARTRATE 25 MG PO TABS
12.5000 mg | ORAL_TABLET | Freq: Two times a day (BID) | ORAL | Status: DC
Start: 2015-06-01 — End: 2015-08-19

## 2015-06-01 MED ORDER — NITROGLYCERIN 0.4 MG SL SUBL: 0.4000 mg | SUBLINGUAL_TABLET | SUBLINGUAL | Status: DC | PRN

## 2015-06-01 NOTE — Progress Notes (Signed)
CARDIAC REHAB PHASE I   PRE:  Rate/Rhythm: 61 SR  BP:  Supine:   Sitting: 114/58  Standing:    SaO2:96%RA   MODE:  Ambulation: 550 ft   POST:  Rate/Rhythm: 66-69 SR  BP:  Supine:   Sitting: 132/80  Standing:    SaO2: 99%RA 1305-1422 Pt walked 550 ft on RA with steady gait. Tolerated well. No CP. MI education completed with pt who voiced understanding. Discussed CRP 2 and will refer to [place]. Stressed importance of brilinta with stent. Pt stated wife has brilinta information. Gave pt stent card. Discussed heart healthy food choices. Encouraged walks later with staff if not discharged.   [name], RN BSN  06/01/2015 2:19 PM

## 2015-06-01 NOTE — Progress Notes (Signed)
06/01/2015 1900 Discharge AVS meds taken today and those due this evening reviewed.  Follow-up appointments and when to call md reviewed.  D/C IV and TELE.  Questions and concerns addressed.   D/C home per orders. [place]

## 2015-06-01 NOTE — Progress Notes (Signed)
        S/W LONNIE @ [employer]/ [employer] RX # [phone]   BRILINTA 90 MG BID 30 DAY SUPPLY   COVER- YES  CO-PAY- $ 147.88  TIER- 3 DRUG  PRIOR APPROVAL - NO  PHARMACY- [facility]

## 2015-06-01 NOTE — Discharge Summary (Signed)
Discharge Summary    Patient ID: Peter [name],  MRN: [id number], DOB/AGE: 11-08-1933 82 y.o.  Admit date: 05/31/2015 Discharge date: 06/01/2015  Primary Care Provider: Chesley Noon Primary Cardiologist: New - Dr. Irish Lack  Discharge Diagnoses    Principal Problem:   ST elevation myocardial infarction (STEMI) of inferior wall [facility]) Active Problems:   Elevated PSA   Hyperlipidemia   History of Present Illness     Peter [name] is a 82 y.o. male with past medical history of HTN, HLD, and elevated PSA (concern for prostate cancer) who presented during the early morning hours of 05/31/2015 as an inferior STEMI.   He reported developed a constant chest heaviness six hours prior with pain radiating down both arms and into his back. His EKG demonstrated ST elevation in the inferior leads and CODE STEMI was activated. He was brought emergently to the cardiac catheterization lab.   Hospital Course     Consultants: None  His catheterization showed 99% stenosis of the posterolateral with a Synergy DES placed. Moderate CAD with 30-50% stenosis was noted along his other cors. He had reported needed a prostate biopsy in the coming months due to an elevated PSA, but we would prefer for him to remain on DAPT with ASA and Brilinta for at least 3 months at a minimum. He was started on a BB and statin as well. The full report is included below.  Later that day, he denied any chest pain or anginal symptoms. An echocardiogram was performed which showed a preserved EF of 60-65%. Lipid Panel showed an elevated LDL of 108. Cyclic troponin values were obtained and were trending up to 2.02.  On 06/01/2015, he continued to deny any anginal symptoms. Vitals and lab results were thoroughly reviewed and appeared stable. He ambulated over 550 ft with cardiac rehab with no complications.  He was last examined by Dr. Angelena Form and deemed stable for discharge. A staff message has been sent to the  office to arrange a 7-day TCM appointment. He was provided prescriptions for his new cardiac medications along with a printed Brilinta Rx to use with his co-pay card. All questions were answered and he was discharged home in good condition.  _____________  Discharge Vitals Blood pressure 114/63, pulse 69, temperature 97.7 F (36.5 C), temperature source Oral, resp. rate 18, height 6' (1.829 m), weight 195 lb 12.3 oz (88.8 kg), SpO2 99 %.  Filed Weights   05/31/15 0304 05/31/15 0449  Weight: 200 lb (90.719 kg) 195 lb 12.3 oz (88.8 kg)    Labs & Radiologic Studies     CBC  Recent Labs  05/31/15 0302 06/01/15 0420  WBC 9.6 8.0  HGB 13.8 13.4  HCT 42.1 40.1  MCV 94.6 93.9  PLT 240 [id number]   Basic Metabolic Panel  Recent Labs  05/31/15 0302 06/01/15 0420  NA 145 143  K 3.5 3.7  CL 113* 114*  CO2 24 21*  GLUCOSE 119* 99  BUN 15 11  CREATININE 0.95 0.84  CALCIUM 9.0 8.5*   Liver Function Tests  Recent Labs  05/31/15 0302  AST 20  ALT 14*  ALKPHOS 52  BILITOT 0.6  PROT 6.0*  ALBUMIN 3.4*   No results for input(s): LIPASE, AMYLASE in the last 72 hours. Cardiac Enzymes  Recent Labs  05/31/15 0532 05/31/15 1155 05/31/15 1604  TROPONINI 0.57* 1.84* 2.02*   BNP Invalid input(s): POCBNP D-Dimer No results for input(s): DDIMER in the last 72 hours.  Hemoglobin A1C  Recent Labs  05/31/15 0533  HGBA1C 5.8*   Fasting Lipid Panel  Recent Labs  05/31/15 0531  CHOL 155  HDL 36*  LDLCALC 108*  TRIG 55  CHOLHDL 4.3   Thyroid Function Tests  Recent Labs  05/31/15 0533  TSH 1.424    No results found.   Diagnostic Studies/Procedures     Cardiac Catheterization: 05/31/2015     Ramus lesion, 50% stenosed.  Ost 2nd Diag to 2nd Diag lesion, 50% stenosed.  Mid LAD lesion, 30% stenosed.  Posterolateral lesion, 99% stenosed, culprit for today's presentation. Post intervention with a 2.5 x 16 Synergy DES- postdilated to > 3 mm, there is a 0%  residual stenosis.  Check echocardiogram. Continue Angiomax until the current bag is run out.  Continue dual antiplatelet therapy for at least 3 months ideally. He does have a prostate biopsy coming up but we will have to see about the urgency of this depending on his visit with the urologist. He states that his PSA was around 4. Restart low-dose statin. He'll need beta blocker as well. I suspect he'll be in the hospital for a couple of days.  Echocardiogram: 05/31/2015 Study Conclusions - Left ventricle: The cavity size was normal. There was mild  concentric hypertrophy. Systolic function was normal. The  estimated ejection fraction was in the range of 60% to 65%. Wall  motion was normal; there were no regional wall motion  abnormalities. Features are consistent with a pseudonormal left  ventricular filling pattern, with concomitant abnormal relaxation  and increased filling pressure (grade 2 diastolic dysfunction). - Aortic valve: There was mild regurgitation. - Mitral valve: Transvalvular velocity was within the normal range.  There was no evidence for stenosis. There was mild regurgitation. - Left atrium: The atrium was mildly dilated. - Right ventricle: The cavity size was normal. Wall thickness was  normal. Systolic function was normal. - Tricuspid valve: There was mild regurgitation. - Pulmonic valve: Transvalvular velocity was within the normal  range. There was no evidence for stenosis. There was no  regurgitation. - Pulmonary arteries: Systolic pressure was within the normal  range. PA peak pressure: 26 mm Hg (S). - Inferior vena cava: The vessel was normal in size. The  respirophasic diameter changes were in the normal range (>= 50%).    Disposition   Pt is being discharged home today in good condition.  Follow-up Plans & Appointments    Follow-up Information    Follow up with [facility].   Specialty:  Cardiology   Why:  Dr.  Hassell Done Office - They will call you to arrange follow-up.   Contact information:   [address] [place] [phone]     Discharge Instructions    Amb Referral to Cardiac Rehabilitation    Complete by:  As directed   Diagnosis:   Myocardial Infarction PCI       Diet - low sodium heart healthy    Complete by:  As directed      Discharge instructions    Complete by:  As directed   PLEASE REMEMBER TO BRING ALL OF YOUR MEDICATIONS TO EACH OF YOUR FOLLOW-UP OFFICE VISITS.  PLEASE ATTEND ALL SCHEDULED FOLLOW-UP APPOINTMENTS.   Activity: Increase activity slowly as tolerated. You may shower, but no soaking baths (or swimming) for 1 week. You may not return to work until cleared by your cardiologist. No lifting over 10 lbs for 4 weeks. No sexual activity  for 4 weeks.   Wound Care: You may wash cath site gently with soap and water. Keep cath site clean and dry. If you notice pain, swelling, bleeding or pus at your cath site, please call [phone].     Increase activity slowly    Complete by:  As directed            Discharge Medications   Discharge Medication List as of 06/01/2015  6:44 PM    START taking these medications   Details  metoprolol tartrate (LOPRESSOR) 25 MG tablet Take 0.5 tablets (12.5 mg total) by mouth 2 (two) times daily., Starting 06/01/2015, Until Discontinued, Normal    nitroGLYCERIN (NITROSTAT) 0.4 MG SL tablet Place 1 tablet (0.4 mg total) under the tongue every 5 (five) minutes x 3 doses as needed for chest pain., Starting 06/01/2015, Until Discontinued, Normal    rosuvastatin (CRESTOR) 10 MG tablet Take 1 tablet (10 mg total) by mouth daily at 6 PM., Starting 06/01/2015, Until Discontinued, Normal      CONTINUE these medications which have CHANGED   Details  ticagrelor (BRILINTA) 90 MG TABS tablet Take 1 tablet (90 mg total) by mouth 2 (two) times daily., Starting 06/01/2015, Until Discontinued, Print        CONTINUE these medications which have NOT CHANGED   Details  acetaminophen (TYLENOL) 500 MG tablet Take 1,000 mg by mouth at bedtime., Until Discontinued, Historical Med    ALPRAZolam (XANAX) 0.5 MG tablet Take 0.25 mg by mouth at bedtime as needed for anxiety. , Until Discontinued, Historical Med    aspirin EC 81 MG tablet Take 81 mg by mouth daily., Until Discontinued, Historical Med    fluticasone (FLONASE) 50 MCG/ACT nasal spray Place 1 spray into the nose daily as needed for allergies. , Starting 01/28/2013, Until Discontinued, Historical Med    hydroxypropyl methylcellulose / hypromellose (ISOPTO TEARS / GONIOVISC) 2.5 % ophthalmic solution Place 1 drop into both eyes 3 (three) times daily as needed for dry eyes., Until Discontinued, Historical Med    omeprazole (PRILOSEC) 20 MG capsule Take 20 mg by mouth daily. , Until Discontinued, Historical Med    polyethylene glycol (MIRALAX) packet Take 17 g by mouth daily as needed for moderate constipation. , Until Discontinued, Historical Med    TURMERIC PO Take 1 capsule by mouth daily., Until Discontinued, Historical Med    valsartan (DIOVAN) 40 MG tablet Take 40 mg by mouth daily., Until Discontinued, Historical Med      STOP taking these medications     gabapentin (NEURONTIN) 100 MG capsule          Aspirin prescribed at discharge?  Yes High Intensity Statin Prescribed? (Lipitor 40-80mg  or Crestor 20-40mg ): Crestor 10mg  - Was intolerant to statins in the past. Beta Blocker Prescribed? Yes For EF 45% or less, Was ACEI/ARB Prescribed? No: EF > 45%.  ADP Receptor Inhibitor Prescribed? (i.e. Plavix etc.-Includes Medically Managed Patients): Yes For EF <40%, Aldosterone Inhibitor Prescribed? No: N/A Was EF assessed during THIS hospitalization? Yes Was Cardiac Rehab [name] ordered? (Included Medically managed Patients): Yes   Allergies Allergies  Allergen Reactions  . Doxycycline Itching  . Levaquin [Levofloxacin In D5w] Other  (See Comments)    Joint swelling  . Lipitor [Atorvastatin] Nausea And Vomiting  . Lisinopril Other (See Comments)    unknown  . Niaspan [Niacin] Other (See Comments)    Joint swelling  . Penicillins Swelling    2/19 Pt states he had Penicillin challenge that was negative for reaction.  Marland Kitchen  Sulfa Antibiotics Itching  . Vytorin [Ezetimibe-Simvastatin] Nausea And Vomiting  . Clindamycin/Lincomycin Rash  . Zithromax [Azithromycin] Rash     Outstanding Labs/Studies   None  Duration of Discharge Encounter   Greater than 30 minutes including physician time.  Signed, Erma Heritage, PA-C 06/01/2015, 7:57 PM

## 2015-06-01 NOTE — Progress Notes (Signed)
SUBJECTIVE: No chest pain or dyspnea.   Tele: sinus  BP 116/66 mmHg  Pulse 50  Temp(Src) 98 F (36.7 C) (Oral)  Resp 12  Ht 6' (1.829 m)  Wt 195 lb 12.3 oz (88.8 kg)  BMI 26.55 kg/m2  SpO2 98%  Intake/Output Summary (Last 24 hours) at 06/01/15 [id number] Last data filed at 05/31/15 1900  Gross per 24 hour  Intake    723 ml  Output    650 ml  Net     73 ml    PHYSICAL EXAM General: Well developed, well nourished, in no acute distress. Alert and oriented x 3.  Psych:  Good affect, responds appropriately Neck: No JVD. No masses noted.  Lungs: Clear bilaterally with no wheezes or rhonci noted.  Heart: RRR with no murmurs noted. Abdomen: Bowel sounds are present. Soft, non-tender.  Extremities: No lower extremity edema.   LABS: Basic Metabolic Panel:  Recent Labs  05/31/15 0302 06/01/15 0420  NA 145 143  K 3.5 3.7  CL 113* 114*  CO2 24 21*  GLUCOSE 119* 99  BUN 15 11  CREATININE 0.95 0.84  CALCIUM 9.0 8.5*   CBC:  Recent Labs  05/31/15 0302 06/01/15 0420  WBC 9.6 8.0  HGB 13.8 13.4  HCT 42.1 40.1  MCV 94.6 93.9  PLT 240 190   Cardiac Enzymes:  Recent Labs  05/31/15 0532 05/31/15 1155 05/31/15 1604  TROPONINI 0.57* 1.84* 2.02*   Fasting Lipid Panel:  Recent Labs  05/31/15 0531  CHOL 155  HDL 36*  LDLCALC 108*  TRIG 55  CHOLHDL 4.3    Current Meds: . aspirin  81 mg Oral Daily  . enoxaparin (LOVENOX) injection  40 mg Subcutaneous Q24H  . gabapentin  100 mg Oral TID  . irbesartan  37.5 mg Oral Daily  . metoprolol tartrate  12.5 mg Oral BID  . pantoprazole  40 mg Oral Daily  . rosuvastatin  10 mg Oral q1800  . sodium chloride flush  3 mL Intravenous Q12H  . ticagrelor  90 mg Oral BID   Echo 05/31/15: - Left ventricle: The cavity size was normal. There was mild  concentric hypertrophy. Systolic function was normal. The  estimated ejection fraction was in the range of 60% to 65%. Wall  motion was normal; there were no regional  wall motion  abnormalities. Features are consistent with a pseudonormal left  ventricular filling pattern, with concomitant abnormal relaxation  and increased filling pressure (grade 2 diastolic dysfunction). - Aortic valve: There was mild regurgitation. - Mitral valve: Transvalvular velocity was within the normal range.  There was no evidence for stenosis. There was mild regurgitation. - Left atrium: The atrium was mildly dilated. - Right ventricle: The cavity size was normal. Wall thickness was  normal. Systolic function was normal. - Tricuspid valve: There was mild regurgitation. - Pulmonic valve: Transvalvular velocity was within the normal  range. There was no evidence for stenosis. There was no  regurgitation. - Pulmonary arteries: Systolic pressure was within the normal  range. PA peak pressure: 26 mm Hg (S). - Inferior vena cava: The vessel was normal in size. The  respirophasic diameter changes were in the normal range (>= 50%).  ASSESSMENT AND PLAN:  1. CAD/Inferior STEMI: Pt admitted with inferior STEMI 05/31/15. The posterolateral branch was sub-totally occluded with poor flow. A DES was placed. He is on ASA and Brilinta. He is on a beta blocker, statin and ARB. He is stable this am. Echo  with normal LV function, minimal valve disease.  2. Prostate cancer: Pt had planned biopsy. Will need to delay given need for DAPT.    Transfer to tele. Possibly home later today if stable. Follow up with Dr. Irish Lack after d/c.   Peter [name],Peter [name]  3/29/20178:23 AM

## 2015-06-01 NOTE — Progress Notes (Signed)
Pt tx DISH per MD order, pt VSS, pt verbalized understanding of tx, pt's wife at [facility], report called to receiving RN, all questions answered, updates given at [facility]

## 2015-06-02 ENCOUNTER — Telehealth: Payer: Self-pay | Admitting: Cardiology

## 2015-06-02 NOTE — Telephone Encounter (Signed)
New message      TCM appt on 06-10-15 with Lyda Jester

## 2015-06-02 NOTE — Telephone Encounter (Signed)
No answer at home number. No voicemail.

## 2015-06-03 NOTE — Telephone Encounter (Signed)
Patient contacted regarding discharge from [facility] on 06/01/15.  Patient understands to follow up with provider Lyda Jester on 06/10/15 at 9:00  at [facility]. Patient understands discharge instructions? yes Patient understands medications and regiment? yes Patient understands to bring all medications to this visit? yes  States he is doing good.  Denies any CP or SOB.  States he is concerned about taking (he thinks) extra dose of Metoprolol this AM.  He had the medication in his container but then couldn't remember if he had taken the pill.  So he called the pharmacist and was told would be to take the 1/2 tablet and monitor his BP and HR.  States his BP is 133/75 HR in 50's.  He is concerned that HR is low.  States he is not light headed or dizzy. He says he has been taking his BP about every 30 min.  Advised that as long as he is not symptomatic he does not need to take BP that often.  Advised to stay hydrated today and light activity. Reviewed all of the medications and he has started the Brilinta and does have all of the other meds.  (R) arm cath site without redness or swelling.  He does state the IV site in hand is sore and bruised.  Suggested he could apply some warm compresses to hand.  Reviewed all of his discharge instructions and he verbalizes understanding.  Advised to bring his medication bottles to his appointment on 4/7.  He will call if he becomes light headed or dizzy.  Wife is in residence so he is not alone.

## 2015-06-08 NOTE — Progress Notes (Signed)
Cardiology Office Note:    Date:  06/09/2015   ID:  Peter [name], DOB [birth year], MRN [id number]  PCP:  Chesley Noon, MD  Cardiologist:  Dr. Casandra Doffing   Electrophysiologist:  N/a Urologist: Dr. Jeffie Pollock  Referring MD: Chesley Noon, MD   Chief Complaint  Patient presents with  . Hospitalization Follow-up    Inf STEMI >> PCI to dist RCA    History of Present Illness:     Peter [name] is a 82 y.o. male with a hx of HTN, HL, elevated PSA. Admitted 3/20-3/29 with inferior STEMI. LHC demonstrated 99% stenosis in the posterior lateral artery treated with a Synergy DES. He had mild to moderate nonobstructive disease elsewhere. EF was preserved by echocardiogram. Given need for upcoming prostate biopsy, it was recommended that he remain on dual antiplatelet therapy for at least 3 months minimum.  He had issues with statins in the past and was restarted on low dose Crestor only.  Here with his wife.  Doing well since DC. He has an occ atypical CP.  No further angina.  Denies syncope, orthopnea, PND, edema.  He has dyspnea with mod to extreme activities without change.     Past Medical History  Diagnosis Date  . Arthritis   . Cataract   . Prostate disease   . Skin cancer   . Hypertension   . Shoulder pain   . Reflux   . Chronic back pain   . Loss of balance   . HLD (hyperlipidemia)   . Anxiety   . Sepsis [facility]) February 2016    Pt stated he was treated at [facility] for this   . Prostate cancer screening     "for several years"  . CAD (coronary artery disease)     a. 05/2015: STEMI: 99% stenosis of Posterolateral w/ Synergy DES placed.    Past Surgical History  Procedure Laterality Date  . Vasectomy    . Appendectomy    . Colon surgery    . Hand surgery Left   . Cardiac catheterization N/A 05/31/2015    Procedure: Left Heart Cath and Coronary Angiography;  Surgeon: Jettie Booze, MD;  Location: [place] [facility];  Service: Cardiovascular;   Laterality: N/A;  . Cardiac catheterization N/A 05/31/2015    Procedure: Coronary Stent Intervention;  Surgeon: Jettie Booze, MD;  Location: [place] [facility];  Service: Cardiovascular;  Laterality: N/A;    Current Medications: Outpatient Prescriptions Prior to Visit  Medication Sig Dispense Refill  . acetaminophen (TYLENOL) 500 MG tablet Take 1,000 mg by mouth at bedtime.    . ALPRAZolam (XANAX) 0.5 MG tablet Take 0.25 mg by mouth at bedtime as needed for anxiety.     Marland Kitchen aspirin EC 81 MG tablet Take 81 mg by mouth daily.    . fluticasone (FLONASE) 50 MCG/ACT nasal spray Place 1 spray into the nose daily as needed for allergies.     . hydroxypropyl methylcellulose / hypromellose (ISOPTO TEARS / GONIOVISC) 2.5 % ophthalmic solution Place 1 drop into both eyes 3 (three) times daily as needed for dry eyes.    . metoprolol tartrate (LOPRESSOR) 25 MG tablet Take 0.5 tablets (12.5 mg total) by mouth 2 (two) times daily. 60 tablet 6  . nitroGLYCERIN (NITROSTAT) 0.4 MG SL tablet Place 1 tablet (0.4 mg total) under the tongue every 5 (five) minutes x 3 doses as needed for chest pain. 25 tablet 3  . omeprazole (PRILOSEC) 20 MG  capsule Take 20 mg by mouth 2 (two) times daily before a meal.     . polyethylene glycol (MIRALAX) packet Take 17 g by mouth daily as needed for moderate constipation.     . rosuvastatin (CRESTOR) 10 MG tablet Take 1 tablet (10 mg total) by mouth daily at 6 PM. 30 tablet 6  . ticagrelor (BRILINTA) 90 MG TABS tablet Take 1 tablet (90 mg total) by mouth 2 (two) times daily. 180 tablet 3  . TURMERIC PO Take 1 capsule by mouth daily.    . valsartan (DIOVAN) 40 MG tablet Take 40 mg by mouth daily.     No facility-administered medications prior to visit.     Allergies:   Doxycycline; Levaquin; Lipitor; Lisinopril; Niaspan; Penicillins; Sulfa antibiotics; Vytorin; Clindamycin/lincomycin; and Zithromax   Social History   Social History  . Marital Status: Married    Spouse  Name: Peter [name]  . Number of Children: 2  . Years of Education: college   Occupational History  . Retired- Network engineer work     Social History Main Topics  . Smoking status: Former Smoker -- 1.00 packs/day for 10 years    Types: Cigarettes    Quit date: 03/05/1972  . Smokeless tobacco: Never Used  . Alcohol Use: 0.6 oz/week    1 Cans of beer per week     Comment: Once a month  . Drug Use: No  . Sexual Activity: Yes   Other Topics Concern  . None   Social History Narrative   Patient lives at home with his spouse. Peter [name])   Caffeine Use: 2 cups of coffee daily   Retired    [employer]   Right handed     Family History:  The patient's family history includes ALS in his brother; Breast cancer in his daughter; Prostate cancer in his father; Stroke in his mother.   ROS:   Please see the history of present illness.    ROS All other systems reviewed and are negative.   Physical Exam:    VS:  BP 130/70 mmHg  Pulse 58  Ht 5' 11.5" (1.816 m)  Wt 197 lb 12.8 oz (89.721 kg)  BMI 27.21 kg/m2   GEN: Well nourished, well developed, in no acute distress HEENT: normal Neck: no JVD, no masses Cardiac: Normal S1/S2, RRR; no murmurs, rubs, or gallops, no edema;  right wrist without hematoma or mass    Respiratory:  clear to auscultation bilaterally; no wheezing, rhonchi or rales GI: soft, nontender, nondistended MS: no deformity or atrophy Skin: warm and dry Neuro: No focal deficits  Psych: Alert and oriented x 3, normal affect  Wt Readings from Last 3 Encounters:  06/09/15 197 lb 12.8 oz (89.721 kg)  05/31/15 195 lb 12.3 oz (88.8 kg)  04/22/14 211 lb 10.3 oz (96 kg)      Studies/Labs Reviewed:     EKG:  EKG is  ordered today.  The ekg ordered today demonstrates Sinus brady, HR 59, TWI 3, aVF, PRWP, no sig changes, QTc 413 ms  Recent Labs: 05/31/2015: ALT 14*; TSH 1.424 06/01/2015: BUN 11; Creatinine, Ser 0.84; Hemoglobin 13.4; Platelets 190; Potassium 3.7; Sodium 143    Recent Lipid Panel    Component Value Date/Time   CHOL 155 05/31/2015 0531   TRIG 55 05/31/2015 0531   HDL 36* 05/31/2015 0531   CHOLHDL 4.3 05/31/2015 0531   VLDL 11 05/31/2015 0531   LDLCALC 108* 05/31/2015 0531    Additional studies/ records that were reviewed  today include:   Echo 05/31/15 Mild Concentric LVH, EF 60-65%, no RWMA, Gr 2 DD, mild AI, mild MR, mild LAE, mild TR, PASP 26 mmHg  LHC 05/31/15 LAD mid 30%, D2 ostial 50% RI 50% LCx ok RCA R post AV branch 99% >> PCI: 2.5 x 16 mm Synergy DES    ASSESSMENT:     1. Essential hypertension   2. Coronary artery disease involving native coronary artery of native heart without angina pectoris   3. Hyperlipidemia   4. Elevated PSA     PLAN:     In order of problems listed above:  1. CAD - s/p Inf STEMI tx with DES to R post AV Branch artery.  EF preserved.  We discussed the importance of dual antiplatelet therapy.  He may need prostate bx at some point.  Would need to at least complete 3 mos of ASA + Brilinta before proceeding.  Hopefully, he can remain on dual antiplatelet Rx for a year without interruption.  He is having some trouble affording Brilinta  -  Continue ASA, Brilinta, beta-blocker, angiotensin receptor blocker, statin.  -  Samples of Brilinta.  Also, submit assistance forms.  2. HTN - BP controlled.  3. HL - Intol of Lipitor.  Now on Crestor 10.  Check Lipids and LFTs in 6 weeks.    4. Elevated PSA - FU with urology.  Hopefully, bx can be put off for 1 year.  As noted, could proceed with bx after 3 mos of Rx with Brilinta if bx is more urgent. If bx becomes urgent, FU here sooner to formulate plan.   Medication Adjustments/Labs and Tests Ordered: Current medicines are reviewed at length with the patient today.  Concerns regarding medicines are outlined above.  Medication changes, Labs and Tests ordered today are outlined in the Patient Instructions noted below. Patient Instructions  Medication  Instructions:  Your physician recommends that you continue on your current medications as directed. Please refer to the Current Medication list given to you today. Labwork: 07/22/15 FOR FASTING CHOLESTEROL PANEL Testing/Procedures: NONE Follow-Up: DR. VARANASI IN 2 MONTHS Any Other Special Instructions Will Be Listed Below (If Applicable). CARDIAC REHAB AT [place] If you need a refill on your cardiac medications before your next appointment, please call your pharmacy.   Signed, Richardson Dopp, PA-C  06/09/2015 9:17 PM    [facility] [place], [place], [place]  [address] Phone: [phone]; Fax: [phone]

## 2015-06-09 ENCOUNTER — Encounter: Payer: Self-pay | Admitting: Physician Assistant

## 2015-06-09 ENCOUNTER — Ambulatory Visit (INDEPENDENT_AMBULATORY_CARE_PROVIDER_SITE_OTHER): Payer: Medicare Other | Admitting: Physician Assistant

## 2015-06-09 VITALS — BP 130/70 | HR 58 | Ht 71.5 in | Wt 197.8 lb

## 2015-06-09 DIAGNOSIS — I10 Essential (primary) hypertension: Secondary | ICD-10-CM | POA: Diagnosis not present

## 2015-06-09 DIAGNOSIS — R972 Elevated prostate specific antigen [PSA]: Secondary | ICD-10-CM

## 2015-06-09 DIAGNOSIS — I2510 Atherosclerotic heart disease of native coronary artery without angina pectoris: Secondary | ICD-10-CM | POA: Diagnosis not present

## 2015-06-09 DIAGNOSIS — E785 Hyperlipidemia, unspecified: Secondary | ICD-10-CM

## 2015-06-09 NOTE — Patient Instructions (Addendum)
Medication Instructions:  Your physician recommends that you continue on your current medications as directed. Please refer to the Current Medication list given to you today. Labwork: 07/22/15 FOR FASTING CHOLESTEROL PANEL Testing/Procedures: NONE Follow-Up: DR. VARANASI IN 2 MONTHS Any Other Special Instructions Will Be Listed Below (If Applicable). CARDIAC REHAB AT [place] If you need a refill on your cardiac medications before your next appointment, please call your pharmacy.

## 2015-06-10 ENCOUNTER — Encounter: Payer: Medicare Other | Admitting: Cardiology

## 2015-07-07 ENCOUNTER — Encounter (HOSPITAL_COMMUNITY)
Admission: RE | Admit: 2015-07-07 | Discharge: 2015-07-07 | Disposition: A | Discharge status: Home / Self Care | Payer: Medicare Other | Source: Ambulatory Visit | Attending: Interventional Cardiology | Admitting: Interventional Cardiology

## 2015-07-07 ENCOUNTER — Encounter (HOSPITAL_COMMUNITY): Payer: Self-pay

## 2015-07-07 VITALS — BP 128/64 | HR 68 | Ht 72.0 in | Wt 201.5 lb

## 2015-07-07 DIAGNOSIS — I213 ST elevation (STEMI) myocardial infarction of unspecified site: Secondary | ICD-10-CM | POA: Diagnosis not present

## 2015-07-07 DIAGNOSIS — Z7982 Long term (current) use of aspirin: Secondary | ICD-10-CM | POA: Insufficient documentation

## 2015-07-07 DIAGNOSIS — I2119 ST elevation (STEMI) myocardial infarction involving other coronary artery of inferior wall: Secondary | ICD-10-CM

## 2015-07-07 DIAGNOSIS — Z87891 Personal history of nicotine dependence: Secondary | ICD-10-CM | POA: Insufficient documentation

## 2015-07-07 DIAGNOSIS — Z955 Presence of coronary angioplasty implant and graft: Secondary | ICD-10-CM | POA: Diagnosis present

## 2015-07-07 DIAGNOSIS — Z79899 Other long term (current) drug therapy: Secondary | ICD-10-CM | POA: Diagnosis not present

## 2015-07-07 NOTE — Progress Notes (Signed)
Cardiac Rehab Medication Review by a Pharmacist  Does the patient  feel that his/her medications are working for him/her?  yes  Has the patient been experiencing any side effects to the medications prescribed?  no  Does the patient measure his/her own blood pressure or blood glucose at home?  yes   Does the patient have any problems obtaining medications due to transportation or finances?   no  Understanding of regimen: good Understanding of indications: good Potential of compliance: good  Pharmacist comments: 82 y/o M presents to cardiac rehab in good spirits and no distress. Provided counseling around medication regimen. Pt endorsed some slight slips in memory recently but attributed it to stress and anxiety with new medications.   Myrene Galas 07/07/2015 8:39 AM

## 2015-07-07 NOTE — Progress Notes (Signed)
Cardiac Individual Treatment Plan  Patient Details  Name: Peter [name] MRN: [id number] Date of Birth: [birth year] Referring Provider:        CARDIAC REHAB PHASE II ORIENTATION from 07/07/2015 in [place]   Referring Provider  Larae Grooms MD      Initial Encounter Date:       CARDIAC REHAB PHASE II ORIENTATION from 07/07/2015 in [place]   Date  07/07/15   Referring Provider  Larae Grooms MD      Visit Diagnosis: ST elevation myocardial infarction (STEMI) involving other coronary artery of inferior wall (HCC)  Stented coronary artery  Patient's Home Medications on Admission:  Current outpatient prescriptions:  .  acetaminophen (TYLENOL) 500 MG tablet, Take 1,000 mg by mouth at bedtime., Disp: , Rfl:  .  ALPRAZolam (XANAX) 0.5 MG tablet, Take 0.25 mg by mouth at bedtime as needed for anxiety. , Disp: , Rfl:  .  aspirin EC 81 MG tablet, Take 81 mg by mouth daily., Disp: , Rfl:  .  fluticasone (FLONASE) 50 MCG/ACT nasal spray, Place 1 spray into the nose daily as needed for allergies. , Disp: , Rfl:  .  hydroxypropyl methylcellulose / hypromellose (ISOPTO TEARS / GONIOVISC) 2.5 % ophthalmic solution, Place 1 drop into both eyes 3 (three) times daily as needed for dry eyes., Disp: , Rfl:  .  metoprolol tartrate (LOPRESSOR) 25 MG tablet, Take 0.5 tablets (12.5 mg total) by mouth 2 (two) times daily., Disp: 60 tablet, Rfl: 6 .  nitroGLYCERIN (NITROSTAT) 0.4 MG SL tablet, Place 1 tablet (0.4 mg total) under the tongue every 5 (five) minutes x 3 doses as needed for chest pain., Disp: 25 tablet, Rfl: 3 .  omeprazole (PRILOSEC) 20 MG capsule, Take 20 mg by mouth daily. , Disp: , Rfl:  .  polyethylene glycol (MIRALAX) packet, Take 17 g by mouth daily as needed for moderate constipation. , Disp: , Rfl:  .  rosuvastatin (CRESTOR) 10 MG tablet, Take 1 tablet (10 mg total) by mouth daily at 6 PM., Disp: 30 tablet, Rfl:  6 .  ticagrelor (BRILINTA) 90 MG TABS tablet, Take 1 tablet (90 mg total) by mouth 2 (two) times daily., Disp: 180 tablet, Rfl: 3 .  valsartan (DIOVAN) 40 MG tablet, Take 40 mg by mouth daily., Disp: , Rfl:  .  TURMERIC PO, Take 1 capsule by mouth daily. Reported on 07/07/2015, Disp: , Rfl:   Past Medical History: Past Medical History  Diagnosis Date  . Arthritis   . Cataract   . Prostate disease   . Skin cancer   . Hypertension   . Shoulder pain   . Reflux   . Chronic back pain   . Loss of balance   . HLD (hyperlipidemia)   . Anxiety   . Sepsis [facility]) February 2016    Pt stated he was treated at [facility] for this   . Prostate cancer screening     "for several years"  . CAD (coronary artery disease)     a. 05/2015: STEMI: 99% stenosis of Posterolateral w/ Synergy DES placed.    Tobacco Use: History  Smoking status  . Former Smoker -- 1.00 packs/day for 10 years  . Types: Cigarettes  . Quit date: 03/05/1972  Smokeless tobacco  . Never Used    Labs:     Recent Review Flowsheet Data    Labs for ITP Cardiac and Pulmonary Rehab Latest Ref Rng 05/31/2015  Cholestrol 0 - 200 mg/dL 155   LDLCALC 0 - 99 mg/dL 108(H)   HDL >40 mg/dL 36(L)   Trlycerides <150 mg/dL 55   Hemoglobin A1c 4.8 - 5.6 % 5.8(H)      Capillary Blood Glucose: No results found for: GLUCAP   Exercise Target Goals: Date: 07/07/15  Exercise Program Goal: Individual exercise prescription set with THRR, safety & activity barriers. Participant demonstrates ability to understand and report RPE using BORG scale, to self-measure pulse accurately, and to acknowledge the importance of the exercise prescription.  Exercise Prescription Goal: Starting with aerobic activity 30 plus minutes a day, 3 days per week for initial exercise prescription. Provide home exercise prescription and guidelines that participant acknowledges understanding prior to discharge.  Activity Barriers & Risk Stratification:      Activity Barriers & Cardiac Risk Stratification - 07/07/15 0803    Activity Barriers & Cardiac Risk Stratification   Activity Barriers Arthritis;Back Problems;Neck/Spine Problems;Muscular Weakness;Joint Problems;Balance Concerns;Other (comment)  very mild and inconsistence balanace concerns due to medication changes   Comments Weak pelvic floor   Cardiac Risk Stratification High      6 Minute Walk:     6 Minute Walk      07/07/15 1110       6 Minute Walk   Phase Initial     Distance 1580 feet     Walk Time 6 minutes     # of Rest Breaks 0     MPH 2.99     METS 2.76     RPE 12     VO2 Peak 9.67     Symptoms No     Resting HR 68 bpm     Resting BP 128/64 mmHg     Max Ex. HR 80 bpm     Max Ex. BP 126/64 mmHg     2 Minute Post BP 126/64 mmHg        Initial Exercise Prescription:     Initial Exercise Prescription - 07/07/15 1100    Date of Initial Exercise RX and Referring Provider   Date 07/07/15   Referring Provider Larae Grooms MD   Bike   Level 0.9   Minutes 10   METs 2.7   NuStep   Level 2   Minutes 10   METs 2   Track   Laps 9   Minutes 10   METs 2.56   Prescription Details   Frequency (times per week) 3   Duration Progress to 30 minutes of continuous aerobic without signs/symptoms of physical distress   Intensity   THRR 40-80% of Max Heartrate 56-118   Ratings of Perceived Exertion 11-13   Progression   Progression Continue to progress workloads to maintain intensity without signs/symptoms of physical distress.   Resistance Training   Training Prescription Yes   Weight 2 lbs   Reps 10-12      Perform Capillary Blood Glucose checks as needed.  Exercise Prescription Changes:   Exercise Comments:   Discharge Exercise Prescription (Final Exercise Prescription Changes):   Nutrition:  Target Goals: Understanding of nutrition guidelines, daily intake of sodium 1500mg , cholesterol 200mg , calories 30% from fat and 7% or less from  saturated fats, daily to have 5 or more servings of fruits and vegetables.  Biometrics:     Pre Biometrics - 07/07/15 0845    Pre Biometrics   Waist Circumference 42 inches   Hip Circumference 43 inches   Waist to Hip Ratio 0.98 %   Triceps Skinfold  20 mm   % Body Fat 29.5 %   Grip Strength 46 kg   Flexibility 9.5 in   Single Leg Stand 1.61 seconds       Nutrition Therapy Plan and Nutrition Goals:   Nutrition Discharge: Nutrition Scores:   Nutrition Goals Re-Evaluation:   Psychosocial: Target Goals: Acknowledge presence or absence of depression, maximize coping skills, provide positive support system. Participant is able to verbalize types and ability to use techniques and skills needed for reducing stress and depression.  Initial Review & Psychosocial Screening:   Quality of Life Scores:     Quality of Life - 07/07/15 1131    Quality of Life Scores   Health/Function Pre 11.61 %   Socioeconomic Pre 21.86 %   Psych/Spiritual Pre 13.43 %   Family Pre 14.2 %   GLOBAL Pre 14.56 %      PHQ-9:     Recent Review Flowsheet Data    There is no flowsheet data to display.      Psychosocial Evaluation and Intervention:   Psychosocial Re-Evaluation:   Vocational Rehabilitation: Provide vocational rehab assistance to qualifying candidates.   Vocational Rehab Evaluation & Intervention:     Vocational Rehab - 07/07/15 1238    Initial Vocational Rehab Evaluation & Intervention   Assessment shows need for Vocational Rehabilitation No      Education: Education Goals: Education classes will be provided on a weekly basis, covering required topics. Participant will state understanding/return demonstration of topics presented.  Learning Barriers/Preferences:     Learning Barriers/Preferences - 07/07/15 0804    Learning Barriers/Preferences   Learning Barriers Inability to learn new things;Sight;Hearing  glasses   Learning Preferences Individual  Instruction;Skilled Demonstration;Video;Written Material      Education Topics: Count Your Pulse:  -Group instruction provided by verbal instruction, demonstration, patient participation and written materials to support subject.  Instructors address importance of being able to find your pulse and how to count your pulse when at home without a heart monitor.  Patients get hands on experience counting their pulse with staff help and individually.   Heart Attack, Angina, and Risk Factor Modification:  -Group instruction provided by verbal instruction, video, and written materials to support subject.  Instructors address signs and symptoms of angina and heart attacks.    Also discuss risk factors for heart disease and how to make changes to improve heart health risk factors.   Functional Fitness:  -Group instruction provided by verbal instruction, demonstration, patient participation, and written materials to support subject.  Instructors address safety measures for doing things around the house.  Discuss how to get up and down off the floor, how to pick things up properly, how to safely get out of a chair without assistance, and balance training.   Meditation and Mindfulness:  -Group instruction provided by verbal instruction, patient participation, and written materials to support subject.  Instructor addresses importance of mindfulness and meditation practice to help reduce stress and improve awareness.  Instructor also leads participants through a meditation exercise.    Stretching for Flexibility and Mobility:  -Group instruction provided by verbal instruction, patient participation, and written materials to support subject.  Instructors lead participants through series of stretches that are designed to increase flexibility thus improving mobility.  These stretches are additional exercise for major muscle groups that are typically performed during regular warm up and cool down.   Hands Only  CPR Anytime:  -Group instruction provided by verbal instruction, video, patient participation and written materials to  support subject.  Instructors co-teach with AHA video for hands only CPR.  Participants get hands on experience with mannequins.   Nutrition I class: Heart Healthy Eating:  -Group instruction provided by PowerPoint slides, verbal discussion, and written materials to support subject matter. The instructor gives an explanation and review of the Therapeutic Lifestyle Changes diet recommendations, which includes a discussion on lipid goals, dietary fat, sodium, fiber, plant stanol/sterol esters, sugar, and the components of a well-balanced, healthy diet.   Nutrition II class: Lifestyle Skills:  -Group instruction provided by PowerPoint slides, verbal discussion, and written materials to support subject matter. The instructor gives an explanation and review of label reading, grocery shopping for heart health, heart healthy recipe modifications, and ways to make healthier choices when eating out.   Diabetes Question & Answer:  -Group instruction provided by PowerPoint slides, verbal discussion, and written materials to support subject matter. The instructor gives an explanation and review of diabetes co-morbidities, pre- and post-prandial blood glucose goals, pre-exercise blood glucose goals, signs, symptoms, and treatment of hypoglycemia and hyperglycemia, and foot care basics.   Diabetes Blitz:  -Group instruction provided by PowerPoint slides, verbal discussion, and written materials to support subject matter. The instructor gives an explanation and review of the physiology behind type 1 and type 2 diabetes, diabetes medications and rational behind using different medications, pre- and post-prandial blood glucose recommendations and Hemoglobin A1c goals, diabetes diet, and exercise including blood glucose guidelines for exercising safely.    Portion Distortion:  -Group instruction  provided by PowerPoint slides, verbal discussion, written materials, and food models to support subject matter. The instructor gives an explanation of serving size versus portion size, changes in portions sizes over the last 20 years, and what consists of a serving from each food group.   Stress Management:  -Group instruction provided by verbal instruction, video, and written materials to support subject matter.  Instructors review role of stress in heart disease and how to cope with stress positively.     Exercising on Your Own:  -Group instruction provided by verbal instruction, power point, and written materials to support subject.  Instructors discuss benefits of exercise, components of exercise, frequency and intensity of exercise, and end points for exercise.  Also discuss use of nitroglycerin and activating EMS.  Review options of places to exercise outside of rehab.  Review guidelines for sex with heart disease.   Cardiac Drugs I:  -Group instruction provided by verbal instruction and written materials to support subject.  Instructor reviews cardiac drug classes: antiplatelets, anticoagulants, beta blockers, and statins.  Instructor discusses reasons, side effects, and lifestyle considerations for each drug class.   Cardiac Drugs II:  -Group instruction provided by verbal instruction and written materials to support subject.  Instructor reviews cardiac drug classes: angiotensin converting enzyme inhibitors (ACE-I), angiotensin II receptor blockers (ARBs), nitrates, and calcium channel blockers.  Instructor discusses reasons, side effects, and lifestyle considerations for each drug class.   Anatomy and Physiology of the Circulatory System:  -Group instruction provided by verbal instruction, video, and written materials to support subject.  Reviews functional anatomy of heart, how it relates to various diagnoses, and what role the heart plays in the overall system.   Knowledge  Questionnaire Score:     Knowledge Questionnaire Score - 07/07/15 1129    Knowledge Questionnaire Score   Pre Score 15/24      Core Components/Risk Factors/Patient Goals at Admission:     Personal Goals and Risk Factors at Admission -  07/07/15 0808    Core Components/Risk Factors/Patient Goals on Admission    Weight Management Yes;Weight Loss   Intervention Weight Management: Develop a combined nutrition and exercise program designed to reach desired caloric intake, while maintaining appropriate intake of nutrient and fiber, sodium and fats, and appropriate energy expenditure required for the weight goal.;Weight Management: Provide education and appropriate resources to help participant work on and attain dietary goals.   Admit Weight 201 lb 8 oz (91.4 kg)   Goal Weight: Short Term 198 lb (89.812 kg)   Goal Weight: Long Term 195 lb (88.451 kg)   Expected Outcomes Long Term: Adherence to nutrition and physical activity/exercise program aimed toward attainment of established weight goal;Short Term: Continue to assess and modify interventions until short term weight is achieved;Weight Loss: Understanding of general recommendations for a balanced deficit meal plan, which promotes 1-2 lb weight loss per week and includes a negative energy balance of [phone] kcal/d;Understanding recommendations for meals to include 15-35% energy as protein, 25-35% energy from fat, 35-60% energy from carbohydrates, less than 200mg  of dietary cholesterol, 20-35 gm of total fiber daily;Understanding of distribution of calorie intake throughout the day with the consumption of 4-5 meals/snacks   Increase Strength and Stamina Yes  Able to pick up 40 lbs motor   Intervention Provide advice, education, support and counseling about physical activity/exercise needs.;Develop an individualized exercise prescription for aerobic and resistive training based on initial evaluation findings, risk stratification, comorbidities and  participant's personal goals.   Expected Outcomes Achievement of increased cardiorespiratory fitness and enhanced flexibility, muscular endurance and strength shown through measurements of functional capacity and personal statement of participant.   Tobacco Cessation Yes  quit 1974, 10 pk/yrs   Hypertension Yes   Intervention Monitor prescription use compliance.;Provide education on lifestyle modifcations including regular physical activity/exercise, weight management, moderate sodium restriction and increased consumption of fresh fruit, vegetables, and low fat dairy, alcohol moderation, and smoking cessation.   Expected Outcomes Short Term: Continued assessment and intervention until BP is < 140/48mm HG in hypertensive participants. < 130/24mm HG in hypertensive participants with diabetes, heart failure or chronic kidney disease.;Long Term: Maintenance of blood pressure at goal levels.   Lipids Yes   Intervention Provide education and support for participant on nutrition & aerobic/resistive exercise along with prescribed medications to achieve LDL 70mg , HDL >40mg .   Expected Outcomes Short Term: Participant states understanding of desired cholesterol values and is compliant with medications prescribed. Participant is following exercise prescription and nutrition guidelines.;Long Term: Cholesterol controlled with medications as prescribed, with individualized exercise RX and with personalized nutrition plan. Value goals: LDL < 70mg , HDL > 40 mg.   Stress Yes   Intervention Offer individual and/or small group education and counseling on adjustment to heart disease, stress management and health-related lifestyle change. Teach and support self-help strategies.   Expected Outcomes Short Term: Participant demonstrates changes in health-related behavior, relaxation and other stress management skills, ability to obtain effective social support, and compliance with psychotropic medications if prescribed.;Long  Term: Emotional wellbeing is indicated by absence of clinically significant psychosocial distress or social isolation.   Personal Goal Other Yes   Personal Goal Learn limitations with activities, improve pain management in pelvic floor   Intervention Provide education and exercise prescription to increase cardiorespiratory fitness, strength, and knowledge   Expected Outcomes improved exercise capacitiy and cardiac awareness      Core Components/Risk Factors/Patient Goals Review:    Core Components/Risk Factors/Patient Goals at Discharge (Final Review):  ITP Comments:     ITP Comments      07/07/15 0802           ITP Comments Medical Director-Dr. Fransico Him, MD          Comments: Patient attended orientation from 0800 to 1000  to review rules and guidelines for program. Completed 6 minute walk test, Intitial ITP, and exercise prescription.  VSS. Telemetry-Sinus Rhythm with a rare PVC.  Asymptomatic. Mr Trundle says he may not be able to participate in the program for a long period of time.

## 2015-07-11 ENCOUNTER — Encounter (HOSPITAL_COMMUNITY)
Admission: RE | Admit: 2015-07-11 | Discharge: 2015-07-11 | Disposition: A | Discharge status: Home / Self Care | Payer: Medicare Other | Source: Ambulatory Visit | Attending: Interventional Cardiology | Admitting: Interventional Cardiology

## 2015-07-11 DIAGNOSIS — I2119 ST elevation (STEMI) myocardial infarction involving other coronary artery of inferior wall: Secondary | ICD-10-CM

## 2015-07-11 DIAGNOSIS — I213 ST elevation (STEMI) myocardial infarction of unspecified site: Secondary | ICD-10-CM | POA: Diagnosis not present

## 2015-07-11 NOTE — Progress Notes (Signed)
Daily Session Note  Patient Details  Name: LATAVION HALLS MRN: [id number] Date of Birth: Nov 20, 1933 Referring Provider:        CARDIAC REHAB PHASE II ORIENTATION from 07/07/2015 in [place]   Referring Provider  Larae Grooms MD      Encounter Date: 07/11/2015  Check In:   Capillary Blood Glucose: No results found for this or any previous visit (from the past 24 hour(s)).   Goals Met:  No report of cardiac concerns or symptoms  Goals Unmet:  BP  Comments: Pt started cardiac rehab today.  Pt tolerated light exercise fair. Mikki Santee did complain about knee and shoulder pain on the airdyne. Blood pressure noted at 160/100 patient was moved to the recumbent bike and tolerated exercise without further complaints. Subsequent blood pressures improved.[facility] called and notified.  No new orders received . telemetry-Normal Sinus Rhythm, asymptomatic.  Medication list reconciled. Pt denies barriers to medicaiton compliance. Mikki Santee was wearing a back brace and bilateral knee braces today at exercise.   PSYCHOSOCIAL ASSESSMENT:  PHQ-0. Pt exhibits positive coping skills, hopeful outlook with supportive family. No psychosocial needs identified at this time, no psychosocial interventions necessary.    Pt enjoys playing golf.   Pt oriented to exercise equipment and routine.    Understanding verbalized.   Dr. Fransico Him is Medical Director for Cardiac Rehab at [facility].

## 2015-07-13 ENCOUNTER — Encounter (HOSPITAL_COMMUNITY)
Admission: RE | Admit: 2015-07-13 | Discharge: 2015-07-13 | Disposition: A | Discharge status: Home / Self Care | Payer: Medicare Other | Source: Ambulatory Visit | Attending: Interventional Cardiology | Admitting: Interventional Cardiology

## 2015-07-13 DIAGNOSIS — I213 ST elevation (STEMI) myocardial infarction of unspecified site: Secondary | ICD-10-CM | POA: Diagnosis not present

## 2015-07-13 DIAGNOSIS — Z955 Presence of coronary angioplasty implant and graft: Secondary | ICD-10-CM

## 2015-07-13 DIAGNOSIS — I2119 ST elevation (STEMI) myocardial infarction involving other coronary artery of inferior wall: Secondary | ICD-10-CM

## 2015-07-15 ENCOUNTER — Encounter (HOSPITAL_COMMUNITY)
Admission: RE | Admit: 2015-07-15 | Discharge: 2015-07-15 | Disposition: A | Discharge status: Home / Self Care | Payer: Medicare Other | Source: Ambulatory Visit | Attending: Interventional Cardiology | Admitting: Interventional Cardiology

## 2015-07-15 DIAGNOSIS — I2119 ST elevation (STEMI) myocardial infarction involving other coronary artery of inferior wall: Secondary | ICD-10-CM

## 2015-07-15 DIAGNOSIS — I213 ST elevation (STEMI) myocardial infarction of unspecified site: Secondary | ICD-10-CM | POA: Diagnosis not present

## 2015-07-15 DIAGNOSIS — Z955 Presence of coronary angioplasty implant and graft: Secondary | ICD-10-CM

## 2015-07-18 ENCOUNTER — Encounter (HOSPITAL_COMMUNITY): Payer: Medicare Other

## 2015-07-20 ENCOUNTER — Encounter (HOSPITAL_COMMUNITY)
Admission: RE | Admit: 2015-07-20 | Discharge: 2015-07-20 | Disposition: A | Discharge status: Home / Self Care | Payer: Medicare Other | Source: Ambulatory Visit | Attending: Interventional Cardiology | Admitting: Interventional Cardiology

## 2015-07-20 DIAGNOSIS — I213 ST elevation (STEMI) myocardial infarction of unspecified site: Secondary | ICD-10-CM | POA: Diagnosis not present

## 2015-07-20 DIAGNOSIS — I2119 ST elevation (STEMI) myocardial infarction involving other coronary artery of inferior wall: Secondary | ICD-10-CM

## 2015-07-20 DIAGNOSIS — Z955 Presence of coronary angioplasty implant and graft: Secondary | ICD-10-CM

## 2015-07-22 ENCOUNTER — Encounter (HOSPITAL_COMMUNITY)
Admission: RE | Admit: 2015-07-22 | Discharge: 2015-07-22 | Disposition: A | Discharge status: Home / Self Care | Payer: Medicare Other | Source: Ambulatory Visit | Attending: Interventional Cardiology | Admitting: Interventional Cardiology

## 2015-07-22 ENCOUNTER — Other Ambulatory Visit (INDEPENDENT_AMBULATORY_CARE_PROVIDER_SITE_OTHER): Payer: Medicare Other | Admitting: *Deleted

## 2015-07-22 DIAGNOSIS — E785 Hyperlipidemia, unspecified: Secondary | ICD-10-CM | POA: Diagnosis not present

## 2015-07-22 DIAGNOSIS — I10 Essential (primary) hypertension: Secondary | ICD-10-CM

## 2015-07-22 DIAGNOSIS — Z955 Presence of coronary angioplasty implant and graft: Secondary | ICD-10-CM

## 2015-07-22 DIAGNOSIS — I2119 ST elevation (STEMI) myocardial infarction involving other coronary artery of inferior wall: Secondary | ICD-10-CM

## 2015-07-22 DIAGNOSIS — I213 ST elevation (STEMI) myocardial infarction of unspecified site: Secondary | ICD-10-CM | POA: Diagnosis not present

## 2015-07-22 LAB — LIPID PANEL
CHOL/HDL RATIO: 3.6 ratio (ref <=5.0)
CHOLESTEROL: 112 mg/dL — AB (ref 125–200)
HDL: 31 mg/dL — AB (ref >=40)
LDL Cholesterol: 67 mg/dL (ref <130)
Triglycerides: 70 mg/dL (ref <150)
VLDL: 14 mg/dL (ref <30)

## 2015-07-22 LAB — HEPATIC FUNCTION PANEL
ALBUMIN: 4.2 g/dL (ref 3.6–5.1)
ALK PHOS: 52 U/L (ref 40–115)
ALT: 14 U/L (ref 9–46)
AST: 16 U/L (ref 10–35)
BILIRUBIN DIRECT: 0.2 mg/dL (ref <=0.2)
BILIRUBIN TOTAL: 0.9 mg/dL (ref 0.2–1.2)
Indirect Bilirubin: 0.7 mg/dL (ref 0.2–1.2)
Total Protein: 6.4 g/dL (ref 6.1–8.1)

## 2015-07-22 NOTE — Addendum Note (Signed)
Addended by: Eulis Foster on: 07/22/2015 08:12 AM   Modules accepted: Orders

## 2015-07-22 NOTE — Progress Notes (Signed)
Reviewed home exercise guidelines with patient including endpoints, temperature precautions, target heart rate and rate of perceived exertion. Pt plans to walk as his mode of home exercise. Pt is limited by pelvic floor, knee and back pain. Pt will graduate early from cardiac rehab in order to return to physical therapy for pelvic floor pain. Pt feels that strength and stamina has improved since he's started cardiac rehab and plans to continue walking at least 3 days per week for 30 minutes as tolerated. Pt voices understanding of instructions given. Sol Passer, MS, ACSM CCEP

## 2015-07-22 NOTE — Progress Notes (Signed)
Discharge Summary  Patient Details  Name: Peter [name] MRN: [id number] Date of Birth: 07-21-1933 Referring Provider:        CARDIAC REHAB PHASE II ORIENTATION from 07/07/2015 in [place]   Referring Provider  Larae Grooms MD       Number of Visits: 6  Reason for Discharge:  Early exit due to pre existing orthopedic conditions that prevented him to from fully engaging in exercise here at cardiac rehab.  Pt unable to exercise on equipment due to discomfort of his pelvic area.  Pt was in a therapy program with PT and this was placed on hold for pt to participate in cardiac rehab.  Pt felt it would be more beneficial for him to discontinue his participation in cardiac rehab and resume his exercise program with PT.  Medication list reconciled.  Pt with questions regarding Brilinta therapy. Advised pt to remain compliant with his anti-platelet therapy and pt given explanation of the indications.  Pt plans to talk to Dr. Loralie Champagne on his next appointment regarding statin therapy and beta blocker. Pt desires to understand how long he will be on these medications. Repeat PHQ1 score remains 0. Although pt pre QOL scores were low in several areas, Pt feels generally hopeful about his future and has a supportive family.  Pt demonstrates healthy and positive coping skills. Pt admits physically he has limitations but he does not allow them to get him down. 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 Former Smoker -- 1.00 packs/day for 10 years  . Types: Cigarettes  . Quit date: 03/05/1972  Smokeless tobacco  . Never Used    Diagnosis:  ST elevation myocardial infarction (STEMI) involving other coronary artery of inferior wall (HCC)  Stented coronary artery  ADL UCSD:   Initial Exercise Prescription:     Initial Exercise Prescription - 07/07/15 1100    Date of Initial Exercise RX and Referring Provider   Date 07/07/15    Referring Provider Larae Grooms MD   Bike   Level 0.9   Minutes 10   METs 2.7   NuStep   Level 2   Minutes 10   METs 2   Track   Laps 9   Minutes 10   METs 2.56   Prescription Details   Frequency (times per week) 3   Duration Progress to 30 minutes of continuous aerobic without signs/symptoms of physical distress   Intensity   THRR 40-80% of Max Heartrate 56-118   Ratings of Perceived Exertion 11-13   Progression   Progression Continue to progress workloads to maintain intensity without signs/symptoms of physical distress.   Resistance Training   Training Prescription Yes   Weight 2 lbs   Reps 10-12      Discharge Exercise Prescription (Final Exercise Prescription Changes):   Functional Capacity:     6 Minute Walk      07/07/15 1110       6 Minute Walk   Phase Initial     Distance 1580 feet     Walk Time 6 minutes     # of Rest Breaks 0     MPH 2.99     METS 2.76     RPE 12     VO2 Peak 9.67     Symptoms No     Resting HR 68 bpm     Resting BP 128/64 mmHg     Max Ex. HR  80 bpm     Max Ex. BP 126/64 mmHg     2 Minute Post BP 126/64 mmHg        Psychological, QOL, Others - Outcomes: PHQ 2/9: Depression screen [facility] 2/9 07/22/2015 07/11/2015  Decreased Interest 0 0  Down, Depressed, Hopeless 0 0  PHQ - 2 Score 0 0    Quality of Life:     Quality of Life - 07/07/15 1131    Quality of Life Scores   Health/Function Pre 11.61 %   Socioeconomic Pre 21.86 %   Psych/Spiritual Pre 13.43 %   Family Pre 14.2 %   GLOBAL Pre 14.56 %      Personal Goals: Goals established at orientation with interventions provided to work toward goal.     Personal Goals and Risk Factors at Admission - 07/07/15 0808    Core Components/Risk Factors/Patient Goals on Admission    Weight Management Yes;Weight Loss   Intervention Weight Management: Develop a combined nutrition and exercise program designed to reach desired caloric intake, while maintaining appropriate  intake of nutrient and fiber, sodium and fats, and appropriate energy expenditure required for the weight goal.;Weight Management: Provide education and appropriate resources to help participant work on and attain dietary goals.   Admit Weight 201 lb 8 oz (91.4 kg)   Goal Weight: Short Term 198 lb (89.812 kg)   Goal Weight: Long Term 195 lb (88.451 kg)   Expected Outcomes Long Term: Adherence to nutrition and physical activity/exercise program aimed toward attainment of established weight goal;Short Term: Continue to assess and modify interventions until short term weight is achieved;Weight Loss: Understanding of general recommendations for a balanced deficit meal plan, which promotes 1-2 lb weight loss per week and includes a negative energy balance of [phone] kcal/d;Understanding recommendations for meals to include 15-35% energy as protein, 25-35% energy from fat, 35-60% energy from carbohydrates, less than 200mg  of dietary cholesterol, 20-35 gm of total fiber daily;Understanding of distribution of calorie intake throughout the day with the consumption of 4-5 meals/snacks   Increase Strength and Stamina Yes  Able to pick up 40 lbs motor   Intervention Provide advice, education, support and counseling about physical activity/exercise needs.;Develop an individualized exercise prescription for aerobic and resistive training based on initial evaluation findings, risk stratification, comorbidities and participant's personal goals.   Expected Outcomes Achievement of increased cardiorespiratory fitness and enhanced flexibility, muscular endurance and strength shown through measurements of functional capacity and personal statement of participant.   Tobacco Cessation Yes  quit 1974, 10 pk/yrs   Hypertension Yes   Intervention Monitor prescription use compliance.;Provide education on lifestyle modifcations including regular physical activity/exercise, weight management, moderate sodium restriction and  increased consumption of fresh fruit, vegetables, and low fat dairy, alcohol moderation, and smoking cessation.   Expected Outcomes Short Term: Continued assessment and intervention until BP is < 140/88mm HG in hypertensive participants. < 130/12mm HG in hypertensive participants with diabetes, heart failure or chronic kidney disease.;Long Term: Maintenance of blood pressure at goal levels.   Lipids Yes   Intervention Provide education and support for participant on nutrition & aerobic/resistive exercise along with prescribed medications to achieve LDL 70mg , HDL >40mg .   Expected Outcomes Short Term: Participant states understanding of desired cholesterol values and is compliant with medications prescribed. Participant is following exercise prescription and nutrition guidelines.;Long Term: Cholesterol controlled with medications as prescribed, with individualized exercise RX and with personalized nutrition plan. Value goals: LDL < 70mg , HDL > 40 mg.   Stress Yes  Intervention Offer individual and/or small group education and counseling on adjustment to heart disease, stress management and health-related lifestyle change. Teach and support self-help strategies.   Expected Outcomes Short Term: Participant demonstrates changes in health-related behavior, relaxation and other stress management skills, ability to obtain effective social support, and compliance with psychotropic medications if prescribed.;Long Term: Emotional wellbeing is indicated by absence of clinically significant psychosocial distress or social isolation.   Personal Goal Other Yes   Personal Goal Learn limitations with activities, improve pain management in pelvic floor   Intervention Provide education and exercise prescription to increase cardiorespiratory fitness, strength, and knowledge   Expected Outcomes improved exercise capacitiy and cardiac awareness       Personal Goals Discharge:   Nutrition & Weight - Outcomes:      Pre Biometrics - 07/07/15 0845    Pre Biometrics   Waist Circumference 42 inches   Hip Circumference 43 inches   Waist to Hip Ratio 0.98 %   Triceps Skinfold 20 mm   % Body Fat 29.5 %   Grip Strength 46 kg   Flexibility 9.5 in   Single Leg Stand 1.61 seconds       Nutrition:     Nutrition Therapy & Goals - 07/08/15 0903    Nutrition Therapy   Diet Therapeutic Lifestyle Change   Personal Nutrition Goals   Personal Goal #1 0.5-2 lb wt loss/week to goal wt loss of 6-16 lb at graduation from [place], educate and counsel regarding individualized specific dietary modifications aiming towards targeted core components such as weight, hypertension, lipid management, diabetes, heart failure and other comorbidities.   Expected Outcomes Short Term Goal: Understand basic principles of dietary content, such as calories, fat, sodium, cholesterol and nutrients.;Long Term Goal: Adherence to prescribed nutrition plan.      Nutrition Discharge:     Nutrition Assessments - 07/08/15 1000    MEDFICTS Scores   Pre Score 44      Education Questionnaire Score:     Knowledge Questionnaire Score - 07/07/15 1129    Knowledge Questionnaire Score   Pre Score 15/24      Goals reviewed with patient; copy given to patient.

## 2015-07-25 ENCOUNTER — Encounter (HOSPITAL_COMMUNITY): Payer: Medicare Other

## 2015-07-25 NOTE — Progress Notes (Signed)
Cardiology Office Note:    Date:  07/26/2015   ID:  [name], DOB [birth year], MRN [id number]  PCP:  Peter Noon, MD  Cardiologist: Dr. Casandra [name]  Electrophysiologist: N/a Urologist: Dr. Jeffie [name]  Referring MD: Peter Noon, MD   Chief Complaint  Patient presents with  . Chest Pain    History of Present Illness:     Peter [name] is a 82 y.o. male with a hx of HTN, HL, elevated PSA. Admitted in 3/17 with inferior STEMI. LHC demonstrated 99% stenosis in the posterior lateral artery treated with a Synergy DES. He had mild to moderate nonobstructive disease elsewhere. EF was preserved by echocardiogram. Given need for upcoming prostate biopsy, it was recommended that he remain on dual antiplatelet therapy for at least 3 months minimum. He had issues with statins in the past and was restarted on low dose Crestor only.  Last seen in clinic by me in 4/17.    Returns today for complaints of chest pain, dyspnea and back pain.  He has noted symptoms over the past 1-2 weeks.  These have gotten worse over the last few days.  He was going to cardiac rehab and has been able to exert himself without chest pain or dyspnea.  He does wear a back brace when he does yard work due to chronic back pain.  He is not sure if his back symptoms are related to the brace or something else.  He feels some lower chest tightness at times.  Denies assoc symptoms.  Denies orthopnea, PND, edema.  Denies syncope.  He is not having any symptoms that are reminiscent of prior angina.  He had recurrent symptoms last night with nausea that caused him some concern.  Denies pleuritic CP.  No weight gain.  No fever, cough.   He had to stop going to cardiac rehab due to pelvic floor pain - he could not sit on the bicycle.     Past Medical History  Diagnosis Date  . Arthritis   . Cataract   . Prostate disease   . Skin cancer   . Hypertension   . Shoulder pain   . Reflux   . Chronic back pain   .  Loss of balance   . HLD (hyperlipidemia)   . Anxiety   . Sepsis [facility]) February 2016    Pt stated he was treated at [facility] for this   . Prostate cancer screening     "for several years"  . CAD (coronary artery disease)     a. 05/2015: STEMI: 99% stenosis of Posterolateral w/ Synergy DES placed.    Past Surgical History  Procedure Laterality Date  . Vasectomy    . Appendectomy    . Colon surgery    . Hand surgery Left   . Cardiac catheterization N/A 05/31/2015    Procedure: Left Heart Cath and Coronary Angiography;  Surgeon: Peter Booze, MD;  Location: [place] [facility];  Service: Cardiovascular;  Laterality: N/A;  . Cardiac catheterization N/A 05/31/2015    Procedure: Coronary Stent Intervention;  Surgeon: Peter Booze, MD;  Location: [place] [facility];  Service: Cardiovascular;  Laterality: N/A;    Current Medications: Outpatient Prescriptions Prior to Visit  Medication Sig Dispense Refill  . acetaminophen (TYLENOL) 500 MG tablet Take 1,000 mg by mouth at bedtime.    . ALPRAZolam (XANAX) 0.5 MG tablet Take 0.25 mg by mouth at bedtime as needed for anxiety.     Marland Kitchen  aspirin EC 81 MG tablet Take 81 mg by mouth daily.    . fluticasone (FLONASE) 50 MCG/ACT nasal spray Place 1 spray into the nose daily as needed for allergies.     . hydroxypropyl methylcellulose / hypromellose (ISOPTO TEARS / GONIOVISC) 2.5 % ophthalmic solution Place 1 drop into both eyes 3 (three) times daily as needed for dry eyes.    . metoprolol tartrate (LOPRESSOR) 25 MG tablet Take 0.5 tablets (12.5 mg total) by mouth 2 (two) times daily. 60 tablet 6  . nitroGLYCERIN (NITROSTAT) 0.4 MG SL tablet Place 1 tablet (0.4 mg total) under the tongue every 5 (five) minutes x 3 doses as needed for chest pain. 25 tablet 3  . omeprazole (PRILOSEC) 20 MG capsule Take 20 mg by mouth daily.     . polyethylene glycol (MIRALAX) packet Take 17 g by mouth daily as needed for moderate constipation.     .  rosuvastatin (CRESTOR) 10 MG tablet Take 1 tablet (10 mg total) by mouth daily at 6 PM. 30 tablet 6  . ticagrelor (BRILINTA) 90 MG TABS tablet Take 1 tablet (90 mg total) by mouth 2 (two) times daily. 180 tablet 3  . valsartan (DIOVAN) 40 MG tablet Take 40 mg by mouth daily.    . TURMERIC PO Take 1 capsule by mouth daily. Reported on 07/26/2015     No facility-administered medications prior to visit.      Allergies:   Doxycycline; Levaquin; Lipitor; Lisinopril; Niaspan; Penicillins; Sulfa antibiotics; Vytorin; Clindamycin/lincomycin; and Zithromax   Social History   Social History  . Marital Status: Married    Spouse Name: [name]  . Number of Children: 2  . Years of Education: college   Occupational History  . Retired- Network engineer work     Social History Main Topics  . Smoking status: Former Smoker -- 1.00 packs/day for 10 years    Types: Cigarettes    Quit date: 03/05/1972  . Smokeless tobacco: Never Used  . Alcohol Use: 0.6 oz/week    1 Cans of beer per week     Comment: Once a month  . Drug Use: No  . Sexual Activity: Yes   Other Topics Concern  . None   Social History Narrative   Patient lives at home with his spouse. [name])   Caffeine Use: 2 cups of coffee daily   Retired    [place]   Right handed     Family History:  The patient's family history includes ALS in his brother; Breast cancer in his daughter; Prostate cancer in his father; Stroke in his mother.   ROS:   Please see the history of present illness.    Review of Systems  Cardiovascular: Positive for chest pain.  Respiratory: Positive for shortness of breath.   Musculoskeletal: Positive for back pain and myalgias.  Gastrointestinal: Positive for abdominal pain.  Memory issues All other systems reviewed and are negative.   Physical Exam:    VS:  BP 130/78 mmHg  Pulse 67  Ht 6' (1.829 m)  Wt 200 lb 3.2 oz (90.81 kg)  BMI 27.15 kg/m2  SpO2 97%   GEN: Well nourished, well developed, in no  acute distress HEENT: normal Neck: no JVD, no masses Cardiac: Normal S1/S2, RRR; no murmurs, rubs, or gallops, no edema;     Respiratory:  clear to auscultation bilaterally; no wheezing, rhonchi or rales GI: soft, nontender, nondistended MS: no deformity or atrophy Skin: warm and dry Neuro: No focal deficits  Psych:  Alert and oriented x 3, normal affect  Wt Readings from Last 3 Encounters:  07/26/15 200 lb 3.2 oz (90.81 kg)  07/07/15 201 lb 8 oz (91.4 kg)  06/09/15 197 lb 12.8 oz (89.721 kg)      Studies/Labs Reviewed:     EKG:  EKG is  ordered today.  The ekg ordered today demonstrates NSR, HR 67, anterior Q waves, QTc 431 ms, no changes.   Recent Labs: 05/31/2015: TSH 1.424 06/01/2015: BUN 11; Creatinine, Ser 0.84; Hemoglobin 13.4; Platelets 190; Potassium 3.7; Sodium 143 07/22/2015: ALT 14   Recent Lipid Panel    Component Value Date/Time   CHOL 112* 07/22/2015 0812   TRIG 70 07/22/2015 0812   HDL 31* 07/22/2015 0812   CHOLHDL 3.6 07/22/2015 0812   VLDL 14 07/22/2015 0812   LDLCALC 67 07/22/2015 0812    Additional studies/ records that were reviewed today include:   Echo 05/31/15 Mild Concentric LVH, EF 60-65%, no RWMA, Gr 2 DD, mild AI, mild MR, mild LAE, mild TR, PASP 26 mmHg  LHC 05/31/15 LAD mid 30%, D2 ostial 50% RI 50% LCx ok RCA R post AV branch 99% >> PCI: 2.5 x 16 mm Synergy DES    ASSESSMENT:     1. Precordial pain   2. Shortness of breath   3. Coronary artery disease involving native coronary artery of native heart without angina pectoris   4. Essential hypertension   5. Hyperlipidemia   6. Elevated PSA     PLAN:     In order of problems listed above:  1. Chest pain - He has a lot of different symptoms that, for the most part, are atypical for ischemia.  He has not had recurrent angina.  But, he has felt more short of breath.  He also has a lot of muscle pain and back pain as well as memory issues.  He did some reading on the internet and is  concerned about Crestor or Brilinta causing his symptoms.  He has not had symptoms of dyspnea until the past couple of weeks.  Of note, he had no symptoms of exertional CP or exertional SOB.  His ECG is unchanged.  He had some chest symptoms last night that were concerning and he still has some discomfort today.  He has not taken any NTG.  Overall, I do no suspect ischemia or ACS.  I suspect his symptoms are related to statin Rx, which he has not been able to tolerate in the past.    -  BMET, BNP, Troponin, CBC  -  Send to ED if Troponin is abnormal.  -  Hold Crestor  -  Arrange ETT-Myoview  -  FU with Dr. Irish Lack 08/19/15 as planned.   2. Dyspnea - It would not be unreasonable to think his dyspnea is a side effect of the Brilinta.  However, he did not have these symptoms after initiating this drug.  I suspect Brilinta is not the cause.  However, if his symptoms continue despite holding Crestor and evaluation as noted above, we could consider changing Brilinta to Plavix.  His echo in 4/17 did demonstrated mod diastolic dysfunction.    -  BMET, BNP >> Add low dose Lasix if BNP is elevated.  3. CAD - s/p Inf STEMI tx with DES to R post AV Branch artery. EF preserved. Proceed with stress testing and Troponin as noted above given current chest symptoms.  Continue ASA, Brilinta, beta-blocker, angiotensin receptor blocker.  4. HTN - BP  controlled.  5. HL - Intol of Lipitor. Now with diffuse pains that may be related to Crestor.  Hold Crestor.  If better off Crestor, try Prava 20 mg 2 days a week.    6. Elevated PSA - FU with urology as planned.    Medication Adjustments/Labs and Tests Ordered: Current medicines are reviewed at length with the patient today.  Concerns regarding medicines are outlined above.  Medication changes, Labs and Tests ordered today are outlined in the Patient Instructions noted below. Patient Instructions  Medication Instructions:  1. HOLD CRESTOR FOR 2-3 WEEKS ; IF YOU  FEEL BETTER OFF THE CRESTOR AFTER 2-3 WEEKS CALL THE OFFICE [phone] TO LET Peter [name], PAC KNOW;  IF YOU DO NOT FEEL BETTER AFTER THE 2-3 WEEKS THEN YOU ARE TO RESUME THE CRESTOR 2. INCREASE PRILOSEC TO 40 MG DAILY Labwork: 1. TODAY STAT TROPONIN ; BMET, CBC W/DIFF, BNP Testing/Procedures: Your physician has requested that you have en exercise stress myoview. For further information please visit [email or web address]. Please follow instruction sheet, as given. Follow-Up: KEEP YOUR APPT WITH DR. VARANASI 08/19/15 Any Other Special Instructions Will Be Listed Below (If Applicable). If you need a refill on your cardiac medications before your next appointment, please call your pharmacy.   Signed, Richardson Dopp, PA-C  07/26/2015 1:00 PM    [facility] [address], [place] [place], [place]  [address] Phone: [phone]; Fax: [phone]

## 2015-07-26 ENCOUNTER — Telehealth: Payer: Self-pay | Admitting: Interventional Cardiology

## 2015-07-26 ENCOUNTER — Telehealth: Payer: Self-pay | Admitting: *Deleted

## 2015-07-26 ENCOUNTER — Telehealth (HOSPITAL_COMMUNITY): Payer: Self-pay | Admitting: *Deleted

## 2015-07-26 ENCOUNTER — Ambulatory Visit (INDEPENDENT_AMBULATORY_CARE_PROVIDER_SITE_OTHER): Payer: Medicare Other | Admitting: Physician Assistant

## 2015-07-26 ENCOUNTER — Encounter: Payer: Self-pay | Admitting: Physician Assistant

## 2015-07-26 VITALS — BP 130/78 | HR 67 | Ht 72.0 in | Wt 200.2 lb

## 2015-07-26 DIAGNOSIS — I10 Essential (primary) hypertension: Secondary | ICD-10-CM

## 2015-07-26 DIAGNOSIS — R0602 Shortness of breath: Secondary | ICD-10-CM

## 2015-07-26 DIAGNOSIS — R972 Elevated prostate specific antigen [PSA]: Secondary | ICD-10-CM

## 2015-07-26 DIAGNOSIS — R072 Precordial pain: Secondary | ICD-10-CM | POA: Diagnosis not present

## 2015-07-26 DIAGNOSIS — I2510 Atherosclerotic heart disease of native coronary artery without angina pectoris: Secondary | ICD-10-CM | POA: Diagnosis not present

## 2015-07-26 DIAGNOSIS — E785 Hyperlipidemia, unspecified: Secondary | ICD-10-CM

## 2015-07-26 DIAGNOSIS — I213 ST elevation (STEMI) myocardial infarction of unspecified site: Secondary | ICD-10-CM | POA: Diagnosis not present

## 2015-07-26 LAB — CBC WITH DIFFERENTIAL/PLATELET
BASOS ABS: 0 {cells}/uL (ref 0–200)
BASOS PCT: 0 %
EOS ABS: 344 {cells}/uL (ref 15–500)
EOS PCT: 4 %
HCT: 45.6 % (ref 38.5–50.0)
HEMOGLOBIN: 15.5 g/dL (ref 13.2–17.1)
LYMPHS ABS: 1462 {cells}/uL (ref 850–3900)
Lymphocytes Relative: 17 %
MCH: 31.6 pg (ref 27.0–33.0)
MCHC: 34 g/dL (ref 32.0–36.0)
MCV: 93.1 fL (ref 80.0–100.0)
MPV: 10.4 fL (ref 7.5–12.5)
Monocytes Absolute: 602 cells/uL (ref 200–950)
Monocytes Relative: 7 %
NEUTROS ABS: 6192 {cells}/uL (ref 1500–7800)
Neutrophils Relative %: 72 %
Platelets: 274 10*3/uL (ref 140–400)
RBC: 4.9 MIL/uL (ref 4.20–5.80)
RDW: 13.5 % (ref 11.0–15.0)
WBC: 8.6 10*3/uL (ref 3.8–10.8)

## 2015-07-26 LAB — BASIC METABOLIC PANEL
BUN: 10 mg/dL (ref 7–25)
CHLORIDE: 107 mmol/L (ref 98–110)
CO2: 26 mmol/L (ref 20–31)
CREATININE: 0.86 mg/dL (ref 0.70–1.18)
Calcium: 9 mg/dL (ref 8.6–10.3)
GLUCOSE: 92 mg/dL (ref 65–99)
Potassium: 4.5 mmol/L (ref 3.5–5.3)
Sodium: 142 mmol/L (ref 135–146)

## 2015-07-26 LAB — TROPONIN I: Troponin I: <0.03 ng/mL (ref <0.031)

## 2015-07-26 LAB — BRAIN NATRIURETIC PEPTIDE: BRAIN NATRIURETIC PEPTIDE: 84.1 pg/mL (ref <100)

## 2015-07-26 NOTE — Telephone Encounter (Signed)
New message       The test results for the Trop. Is less than 0.03

## 2015-07-26 NOTE — Telephone Encounter (Signed)
[place], [place]   07/26/2015 6:00 PM

## 2015-07-26 NOTE — Telephone Encounter (Signed)
Pt notified of all lab results by phone with verbal understanding. Pt asked if there was a coupon for Brilinta. I printed a coupon and pt will pick up 5/25 when he comes in for stress test. Coupon placed at front desk.

## 2015-07-26 NOTE — Patient Instructions (Addendum)
Medication Instructions:  1. HOLD CRESTOR FOR 2-3 WEEKS ; IF YOU FEEL BETTER OFF THE CRESTOR AFTER 2-3 WEEKS CALL THE OFFICE [phone] TO LET SCOTT WEAVER, PAC KNOW;  IF YOU DO NOT FEEL BETTER AFTER THE 2-3 WEEKS THEN YOU ARE TO RESUME THE CRESTOR 2. INCREASE PRILOSEC TO 40 MG DAILY Labwork: 1. TODAY STAT TROPONIN ; BMET, CBC W/DIFF, BNP Testing/Procedures: Your physician has requested that you have en exercise stress myoview. For further information please visit [email or web address]. Please follow instruction sheet, as given. Follow-Up: KEEP YOUR APPT WITH DR. VARANASI 08/19/15 Any Other Special Instructions Will Be Listed Below (If Applicable). If you need a refill on your cardiac medications before your next appointment, please call your pharmacy.

## 2015-07-26 NOTE — Telephone Encounter (Signed)
Patient given detailed instructions per Myocardial Perfusion Study Information Sheet for the test on 07/28/15 at 0730. Patient notified to arrive 15 minutes early and that it is imperative to arrive on time for appointment to keep from having the test rescheduled.  If you need to cancel or reschedule your appointment, please call the office within 24 hours of your appointment. Failure to do so may result in a cancellation of your appointment, and a $50 no show fee. Patient verbalized understanding.Zariah Jost, Ranae [place]

## 2015-07-26 NOTE — Telephone Encounter (Signed)
Pt called and wanted to make sure he should take his medications before his appt today. Pt states he is coming in today for back pain, doe. We went over his medications together. I advised take meds as he usually does and keep appt today at 11:30. Pt ok

## 2015-07-27 ENCOUNTER — Encounter (HOSPITAL_COMMUNITY): Payer: Medicare Other

## 2015-07-28 ENCOUNTER — Ambulatory Visit (HOSPITAL_COMMUNITY): Payer: Medicare Other | Attending: Cardiovascular Disease

## 2015-07-28 ENCOUNTER — Encounter: Payer: Self-pay | Admitting: Physician Assistant

## 2015-07-28 DIAGNOSIS — I10 Essential (primary) hypertension: Secondary | ICD-10-CM | POA: Insufficient documentation

## 2015-07-28 DIAGNOSIS — R9439 Abnormal result of other cardiovascular function study: Secondary | ICD-10-CM | POA: Diagnosis not present

## 2015-07-28 DIAGNOSIS — I2510 Atherosclerotic heart disease of native coronary artery without angina pectoris: Secondary | ICD-10-CM

## 2015-07-28 DIAGNOSIS — R0602 Shortness of breath: Secondary | ICD-10-CM | POA: Diagnosis not present

## 2015-07-28 DIAGNOSIS — R072 Precordial pain: Secondary | ICD-10-CM | POA: Diagnosis not present

## 2015-07-28 LAB — MYOCARDIAL PERFUSION IMAGING
CHL CUP MPHR: 141 {beats}/min
CHL RATE OF PERCEIVED EXERTION: 18
CSEPEW: 7 METS
CSEPPHR: 136 {beats}/min
Exercise duration (min): 5 min
Exercise duration (sec): 30 s
LHR: 0.22
LVDIAVOL: 94 mL (ref 62–150)
LVSYSVOL: 43 mL
Percent HR: 96 %
Rest HR: 59 {beats}/min
SDS: 5
SRS: 1
SSS: 6
TID: 1.07

## 2015-07-28 MED ORDER — TECHNETIUM TC 99M TETROFOSMIN IV KIT
10.2000 | PACK | Freq: Once | INTRAVENOUS | Status: AC | PRN
  Administered 2015-07-28: 10 via INTRAVENOUS
  Filled 2015-07-28: qty 10

## 2015-07-28 MED ORDER — TECHNETIUM TC 99M TETROFOSMIN IV KIT
32.4000 | PACK | Freq: Once | INTRAVENOUS | Status: AC | PRN
  Administered 2015-07-28: 32 via INTRAVENOUS
  Filled 2015-07-28: qty 32

## 2015-07-29 ENCOUNTER — Telehealth: Payer: Self-pay | Admitting: Interventional Cardiology

## 2015-07-29 ENCOUNTER — Encounter (HOSPITAL_COMMUNITY): Payer: Medicare Other

## 2015-07-29 NOTE — Telephone Encounter (Signed)
New Message  Pt stated he is returning RN phone call- he stated possibly about NucStress results. Please call back and discuss.

## 2015-07-29 NOTE — Telephone Encounter (Signed)
The pt has been given his lab results.  He verbalized understanding. 

## 2015-08-03 ENCOUNTER — Encounter (HOSPITAL_COMMUNITY): Payer: Medicare Other

## 2015-08-05 ENCOUNTER — Encounter (HOSPITAL_COMMUNITY): Payer: Medicare Other

## 2015-08-08 ENCOUNTER — Other Ambulatory Visit: Payer: Self-pay | Admitting: Sports Medicine

## 2015-08-08 ENCOUNTER — Ambulatory Visit (INDEPENDENT_AMBULATORY_CARE_PROVIDER_SITE_OTHER): Payer: Medicare Other | Admitting: Sports Medicine

## 2015-08-08 ENCOUNTER — Encounter: Payer: Self-pay | Admitting: Sports Medicine

## 2015-08-08 ENCOUNTER — Encounter (HOSPITAL_COMMUNITY): Payer: Medicare Other

## 2015-08-08 ENCOUNTER — Ambulatory Visit
Admission: RE | Admit: 2015-08-08 | Discharge: 2015-08-08 | Disposition: A | Discharge status: Home / Self Care | Payer: Medicare Other | Source: Ambulatory Visit | Attending: Sports Medicine | Admitting: Sports Medicine

## 2015-08-08 VITALS — BP 145/70 | HR 64 | Ht 72.0 in | Wt 200.0 lb

## 2015-08-08 DIAGNOSIS — M546 Pain in thoracic spine: Secondary | ICD-10-CM

## 2015-08-08 DIAGNOSIS — M25561 Pain in right knee: Secondary | ICD-10-CM

## 2015-08-08 DIAGNOSIS — I2510 Atherosclerotic heart disease of native coronary artery without angina pectoris: Secondary | ICD-10-CM

## 2015-08-08 NOTE — Progress Notes (Signed)
Subjective:    Patient ID: Peter [name], male    DOB: 30-Mar-1933, 82 y.o.   MRN: [id number]  HPI chief complaint: Mid back pain  Very pleasant 82 year old male comes in today complaining of long-standing mid back pain. His pain was initially localized around the right scapular area. It was worse with activity such as twisting while playing golf. In March he suffered a STEMI and since then his pain has become more noticeable and more diffuse. It is now diffuse throughout the thoracic spine with some radiating discomfort out toward the ribs. It is worse at night. He was initially taking ibuprofen and meloxicam but has had to discontinue both of those due to his recent cardiac history. He is now taking 1000 milligrams of acetaminophen at night and he does think that it is helping some. He has not had any recent imaging of his thoracic spine. He does have a history of a compression type fracture in his lumbar spine which was picked up on an x-ray done by Dr. Junius Roads in 2013. Despite his pain the patient tries to remain active. He brought several medical records with him today including some records from [facility]. He also has some lumbar spine exercises that he has been doing for several years. He denies any fevers or chills. There is no history of cancer but he does state that he was scheduled for a prostate biopsy prior to his recent MI. He is now on the blood thinner Brilinta so that biopsy is on hold (his urologist is continuing to follow his PSAs).  Past medical history is reviewed. Current medications are reviewed Surgical history is reviewed. He has a history of left shoulder arthroscopy done by Dr. Marlou Sa in 2015. It was a debridement, acromioplasty, and distal clavicle excision. He has done well postoperatively. He has also recently had an MRI of his right shoulder which shows similar findings but his symptoms are not severe enough that he wants to consider surgery. He has also seen  rheumatology and that workup was negative. He also has a history of a benign appearing lesion in the right proximal femur which was seen on a CT scan in February 2016. It was recommended that follow-up films be done at a later date to ensure its benign nature. However, he has not had any follow-up films of this. Family history is positive for prostate cancer in his father.    Review of Systems    as above Objective:   Physical Exam  Well-developed, well-nourished. No acute distress. Sitting currently in the exam room. Vital signs reviewed.  Thoracic spine: There is no focal tenderness to palpation. Patient does have some peri-scapular pain on the right when reaching across his body to touch his left shoulder. Mild kyphosis. Neurological exam shows no gross neurological deficit of his lower extremities.      Assessment & Plan:   Chronic thoracic pain likely secondary to facet arthropathy/degenerative disc disease/kyphosis Status post recent STEMI History of elevated PSA Status post left shoulder arthroscopy rotator cuff debridement, acromioplasty, and distal clavicle excision (2015) Right shoulder rotator cuff tendinopathy/tearing Right femoral cyst, likely benign  I'm going to start with getting some x-rays of his thoracic spine and referring him to physical therapy (he would like to start with seeing Sheliah Mends). I reviewed his home exercises with him and I recommended that he avoid any sort of McKenzie exercise that puts him into extension. I think he should also avoid scapular stabilization exercises for  the time being. I will also get an x-ray of his right femur to reevaluate the cyst seen on his previous CT scan. Patient will follow-up with me in 4 weeks. I did discuss the possibility of further diagnostic imaging of his thoracic spine if his symptoms persist but we will need to make sure that we can do this given his recent cardiac procedure. 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 He is doing well in regards to his recent left shoulder arthroscopy. I recommended that he continue with conservative treatment for the right shoulder as long as his symptoms are tolerable. He agrees with this plan.

## 2015-08-10 ENCOUNTER — Encounter (HOSPITAL_COMMUNITY): Payer: Medicare Other

## 2015-08-12 ENCOUNTER — Encounter (HOSPITAL_COMMUNITY): Payer: Medicare Other

## 2015-08-15 ENCOUNTER — Encounter (HOSPITAL_COMMUNITY): Payer: Medicare Other

## 2015-08-17 ENCOUNTER — Encounter (HOSPITAL_COMMUNITY): Payer: Medicare Other

## 2015-08-17 NOTE — Progress Notes (Signed)
Cardiology Office Note   Date:  08/19/2015   ID:  Peter [name], DOB [birth year], MRN [id number]  PCP:  Chesley Noon, MD    No chief complaint on file. f/u CAD- inferior MI   Wt Readings from Last 3 Encounters:  08/19/15 200 lb (90.719 kg)  08/08/15 200 lb (90.719 kg)  07/28/15 200 lb (90.719 kg)       History of Present Illness: Peter [name] is a 82 y.o. male  with a hx of HTN, HL, elevated PSA. Admitted in 3/17 with inferior STEMI. LHC demonstrated 99% stenosis in the posterior lateral artery treated with a Synergy DES. He had mild to moderate nonobstructive disease elsewhere. EF was preserved by echocardiogram. Given need for upcoming prostate biopsy, it was recommended that he remain on dual antiplatelet therapy for at least 3 months minimum. He had issues with statins in the past and was restarted on low dose Crestor only.  He had some chest pain, dyspnea a few months after the MI in May 2017.  Different than his prior angina. May 2017 showed some inferior scar but no ischemia.  If dyspnea persists, would consider change Brilinta to Plavix.  He still has some occasional chest discomfort lasting a few seconds.  It feels different than his prior discomfort.  Bruises easily.  His Crestor was stopped at his last visit due to concerns about his memory.  He feels some weakness and fatigue when going up hills.  He just started going up hills.  He is not walking as much due to back pain and pelvic floor pain.   After stoppping the Crestor, he initially felt better, but sx are now worse again.  Back pain especially is worse.     Past Medical History  Diagnosis Date  . Arthritis   . Cataract   . Prostate disease   . Skin cancer   . Hypertension   . Shoulder pain   . Reflux   . Chronic back pain   . Loss of balance   . HLD (hyperlipidemia)   . Anxiety   . Sepsis [facility]) February 2016    Pt stated he was treated at [facility] for this   . Prostate cancer  screening     "for several years"  . CAD (coronary artery disease)     a. 05/2015: STEMI: 99% stenosis of Posterolateral w/ Synergy DES placed.  Marland Kitchen History of nuclear stress test     ETT-Myoview 5/17: EF 54%, inf scar, no ischemia; Low Risk    Past Surgical History  Procedure Laterality Date  . Vasectomy    . Appendectomy    . Colon surgery    . Hand surgery Left   . Cardiac catheterization N/A 05/31/2015    Procedure: Left Heart Cath and Coronary Angiography;  Surgeon: Jettie Booze, MD;  Location: [facility];  Service: Cardiovascular;  Laterality: N/A;  . Cardiac catheterization N/A 05/31/2015    Procedure: Coronary Stent Intervention;  Surgeon: Jettie Booze, MD;  Location: [facility];  Service: Cardiovascular;  Laterality: N/A;     Current Outpatient Prescriptions  Medication Sig Dispense Refill  . acetaminophen (TYLENOL) 500 MG tablet Take 1,000 mg by mouth at bedtime.    . ALPRAZolam (XANAX) 0.5 MG tablet Take 0.25 mg by mouth at bedtime as needed for anxiety.     Marland Kitchen aspirin EC 81 MG tablet Take 81 mg by mouth daily.    . fluticasone (  FLONASE) 50 MCG/ACT nasal spray Place 1 spray into the nose daily as needed for allergies.     . hydroxypropyl methylcellulose / hypromellose (ISOPTO TEARS / GONIOVISC) 2.5 % ophthalmic solution Place 1 drop into both eyes 3 (three) times daily as needed for dry eyes.    . metoprolol tartrate (LOPRESSOR) 25 MG tablet Take 0.5 tablets (12.5 mg total) by mouth 2 (two) times daily. 60 tablet 6  . nitroGLYCERIN (NITROSTAT) 0.4 MG SL tablet Place 1 tablet (0.4 mg total) under the tongue every 5 (five) minutes x 3 doses as needed for chest pain. 25 tablet 3  . omeprazole (PRILOSEC) 20 MG capsule Take 20 mg by mouth daily.     . polyethylene glycol (MIRALAX) packet Take 17 g by mouth daily as needed for moderate constipation.     . rosuvastatin (CRESTOR) 10 MG tablet Take 1 tablet (10 mg total) by mouth daily at 6 PM. 30 tablet 6    . ticagrelor (BRILINTA) 90 MG TABS tablet Take 1 tablet (90 mg total) by mouth 2 (two) times daily. 180 tablet 3  . valsartan (DIOVAN) 40 MG tablet Take 40 mg by mouth daily.     No current facility-administered medications for this visit.    Allergies:   Doxycycline; Levaquin; Lipitor; Lisinopril; Niaspan; Penicillins; Sulfa antibiotics; Vytorin; Clindamycin/lincomycin; and Zithromax    Social History:  The patient  reports that he quit smoking about 43 years ago. His smoking use included Cigarettes. He has a 10 pack-year smoking history. He has never used smokeless tobacco. He reports that he drinks about 0.6 oz of alcohol per week. He reports that he does not use illicit drugs.   Family History:  The patient's family history includes ALS in his brother; Breast cancer in his daughter; Prostate cancer in his father; Stroke in his mother.    ROS:  Please see the history of present illness.   Otherwise, review of systems are positive for memory problems, back pain.   All other systems are reviewed and negative.    PHYSICAL EXAM: VS:  BP 120/70 mmHg  Pulse 62  Ht 5' 11.5" (1.816 m)  Wt 200 lb (90.719 kg)  BMI 27.51 kg/m2 , BMI Body mass index is 27.51 kg/(m^2). GEN: Well nourished, well developed, in no acute distress HEENT: normal Neck: no JVD, carotid bruits, or masses Cardiac: RRR; no murmurs, rubs, or gallops,no edema  Respiratory:  clear to auscultation bilaterally, normal work of breathing GI: soft, nontender, nondistended, + BS MS: no deformity or atrophy Skin: warm and dry, no rash Neuro:  Strength and sensation are intact Psych: euthymic mood, full affect    Recent Labs: 05/31/2015: TSH 1.424 07/22/2015: ALT 14 07/26/2015: Brain Natriuretic Peptide 84.1; BUN 10; Creat 0.86; Hemoglobin 15.5; Platelets 274; Potassium 4.5; Sodium 142   Lipid Panel    Component Value Date/Time   CHOL 112* 07/22/2015 0812   TRIG 70 07/22/2015 0812   HDL 31* 07/22/2015 0812   CHOLHDL  3.6 07/22/2015 0812   VLDL 14 07/22/2015 0812   LDLCALC 67 07/22/2015 0812     Other studies Reviewed: Additional studies/ records that were reviewed today with results demonstrating: cath results.   ASSESSMENT AND PLAN:  1. CAD: No anginal sx.  Some atypical chest pain.  COntinue dual antiplatelet therapy.   2. Old MI: No CHF sx. 3. HTN: BP well controlled. Has been on valsartan for many years. He had one reading of 102/64.  Will stop metoprolol. 4. Hyperlipidemia: Will  restart Crestor once a week and will recheck lipids in 2 months.   5. Memory issues: they do predate the MI, but he thinks "they have definitely got worse."  He thinks that it may be the metoprolol.  Will stop metoprolol for now.   Current medicines are reviewed at length with the patient today.  The patient concerns regarding his medicines were addressed.  The following changes have been made:  As above  Labs/ tests ordered today include: lipids,LFTs No orders of the defined types were placed in this encounter.    Recommend 150 minutes/week of aerobic exercise Low fat, low carb, high fiber diet recommended  Disposition:   FU in 6 months   Signed, Larae Grooms, MD  08/19/2015 8:31 AM    [facility] [place], [place], [place]  [address] Phone: [phone]; Fax: [phone]

## 2015-08-19 ENCOUNTER — Encounter (HOSPITAL_COMMUNITY): Payer: Medicare Other

## 2015-08-19 ENCOUNTER — Ambulatory Visit (INDEPENDENT_AMBULATORY_CARE_PROVIDER_SITE_OTHER): Payer: Medicare Other | Admitting: Interventional Cardiology

## 2015-08-19 ENCOUNTER — Encounter: Payer: Self-pay | Admitting: Interventional Cardiology

## 2015-08-19 VITALS — BP 120/70 | HR 62 | Ht 71.5 in | Wt 200.0 lb

## 2015-08-19 DIAGNOSIS — I10 Essential (primary) hypertension: Secondary | ICD-10-CM | POA: Diagnosis not present

## 2015-08-19 DIAGNOSIS — I25119 Atherosclerotic heart disease of native coronary artery with unspecified angina pectoris: Secondary | ICD-10-CM | POA: Diagnosis not present

## 2015-08-19 DIAGNOSIS — E785 Hyperlipidemia, unspecified: Secondary | ICD-10-CM | POA: Diagnosis not present

## 2015-08-19 DIAGNOSIS — I252 Old myocardial infarction: Secondary | ICD-10-CM | POA: Diagnosis not present

## 2015-08-19 MED ORDER — ROSUVASTATIN CALCIUM 10 MG PO TABS: 10.0000 mg | ORAL_TABLET | ORAL | Status: DC

## 2015-08-19 NOTE — Patient Instructions (Signed)
**Note De-Identified  Obfuscation** Medication Instructions:  Resume Crestor at 10 mg once a week and stop taking Metoprolol-All other medications remain the same.  Labwork: Lipids and LFTS in 2 months-Please do not eat or drink after midnight the night before labs are drawn.  Testing/Procedures: None  Follow-Up: Your physician wants you to follow-up in: 6 months. You will receive a reminder letter in the mail two months in advance. If you don't receive a letter, please call our office to schedule the follow-up appointment.     If you need a refill on your cardiac medications before your next appointment, please call your pharmacy.

## 2015-08-22 ENCOUNTER — Encounter (HOSPITAL_COMMUNITY): Payer: Medicare Other

## 2015-08-24 ENCOUNTER — Encounter (HOSPITAL_COMMUNITY): Payer: Medicare Other

## 2015-08-26 ENCOUNTER — Encounter (HOSPITAL_COMMUNITY): Payer: Medicare Other

## 2015-08-29 ENCOUNTER — Encounter (HOSPITAL_COMMUNITY): Payer: Medicare Other

## 2015-08-31 ENCOUNTER — Encounter (HOSPITAL_COMMUNITY): Payer: Medicare Other

## 2015-09-02 ENCOUNTER — Encounter (HOSPITAL_COMMUNITY): Payer: Medicare Other

## 2015-09-05 ENCOUNTER — Encounter (HOSPITAL_COMMUNITY): Payer: Medicare Other

## 2015-09-07 ENCOUNTER — Telehealth: Payer: Self-pay | Admitting: *Deleted

## 2015-09-07 ENCOUNTER — Ambulatory Visit (INDEPENDENT_AMBULATORY_CARE_PROVIDER_SITE_OTHER): Payer: Medicare Other | Admitting: Sports Medicine

## 2015-09-07 ENCOUNTER — Encounter (HOSPITAL_COMMUNITY): Payer: Medicare Other

## 2015-09-07 ENCOUNTER — Encounter: Payer: Self-pay | Admitting: Sports Medicine

## 2015-09-07 VITALS — BP 146/88 | Ht 72.0 in | Wt 200.0 lb

## 2015-09-07 DIAGNOSIS — I25119 Atherosclerotic heart disease of native coronary artery with unspecified angina pectoris: Secondary | ICD-10-CM | POA: Diagnosis not present

## 2015-09-07 DIAGNOSIS — M546 Pain in thoracic spine: Secondary | ICD-10-CM | POA: Diagnosis not present

## 2015-09-07 MED ORDER — TRAMADOL HCL 50 MG PO TABS: 50.0000 mg | ORAL_TABLET | Freq: Two times a day (BID) | ORAL | Status: DC | PRN

## 2015-09-07 NOTE — Progress Notes (Signed)
Patient ID: Peter [name], male   DOB: 1933/12/13, 82 y.o.   MRN: [id number]  Patient comes in today for follow-up on chronic thoracic back pain. Since seeing me, he has started some chiropractic treatment and feels like that has been helpful. His pain is worse at night. X-rays of his thoracic spine showed multilevel degenerative changes. Physical exam was not repeated. We simply talked about his ongoing mid back pain. At this point in time I think I would like to try him on a low dose of tramadol at night. If he does not have any adverse effects from this then he may try taking it during the day as well. We also talked about some chronic bilateral knee pain that he has. It is in the medial aspect of his knee. Recent x-rays, which she brought with him, show minimal degenerative changes. It was suggested that he consider Visco supplementation but he has not been able to start that due to being on a blood tender for his recent myocardial infarction. He has had cortisone injections in the past with limited benefit. I will see him back in 4 weeks and we will see how he responds to the addition of tramadol.  Total of 15 minutes was spent with the patient with greater than 50% of the time spent in face-to-face consultation discussing his chronic issues and treatment plan.

## 2015-09-07 NOTE — Telephone Encounter (Signed)
PT WAS PUT ON TRAMADOL BY DR. Jonelle Sports, THE DOC SUGGESTED THAT PT CONFIRMED WITH DR Irish Lack THAT THIS IS OK FOR HIM TO TAKE WITH HIS CARDIAC MEDS, PLEASE CALL HIM, THANKS, CAN LEAVE A MESSAGE ON VM IF HE DOESN'T ANSWER.

## 2015-09-08 DIAGNOSIS — I639 Cerebral infarction, unspecified: Secondary | ICD-10-CM

## 2015-09-08 HISTORY — DX: Cerebral infarction, unspecified: I63.9

## 2015-09-08 NOTE — Telephone Encounter (Signed)
**Note De-identified  Obfuscation** Please advise 

## 2015-09-08 NOTE — Telephone Encounter (Signed)
The pt is advised and he verbalized understanding. 

## 2015-09-08 NOTE — Telephone Encounter (Signed)
Please advise 

## 2015-09-08 NOTE — Telephone Encounter (Signed)
Please inform pt that tramadol is fine to take with his other meds.

## 2015-09-08 NOTE — Telephone Encounter (Signed)
I think it is ok. Would check with PharmD.

## 2015-09-09 ENCOUNTER — Inpatient Hospital Stay (HOSPITAL_COMMUNITY)
Admission: EM | Admit: 2015-09-09 | Discharge: 2015-09-11 | DRG: 066 | Disposition: A | Discharge status: Home / Self Care | Payer: Medicare Other | Attending: Internal Medicine | Admitting: Internal Medicine

## 2015-09-09 ENCOUNTER — Inpatient Hospital Stay (HOSPITAL_COMMUNITY): Payer: Medicare Other

## 2015-09-09 ENCOUNTER — Encounter (HOSPITAL_COMMUNITY): Payer: Self-pay | Admitting: Physical Medicine and Rehabilitation

## 2015-09-09 ENCOUNTER — Emergency Department (HOSPITAL_COMMUNITY): Payer: Medicare Other

## 2015-09-09 ENCOUNTER — Encounter (HOSPITAL_COMMUNITY): Payer: Medicare Other

## 2015-09-09 DIAGNOSIS — Z888 Allergy status to other drugs, medicaments and biological substances status: Secondary | ICD-10-CM

## 2015-09-09 DIAGNOSIS — K219 Gastro-esophageal reflux disease without esophagitis: Secondary | ICD-10-CM | POA: Diagnosis present

## 2015-09-09 DIAGNOSIS — Z79899 Other long term (current) drug therapy: Secondary | ICD-10-CM | POA: Diagnosis not present

## 2015-09-09 DIAGNOSIS — M1990 Unspecified osteoarthritis, unspecified site: Secondary | ICD-10-CM | POA: Diagnosis present

## 2015-09-09 DIAGNOSIS — I10 Essential (primary) hypertension: Secondary | ICD-10-CM | POA: Diagnosis present

## 2015-09-09 DIAGNOSIS — Z85828 Personal history of other malignant neoplasm of skin: Secondary | ICD-10-CM | POA: Diagnosis not present

## 2015-09-09 DIAGNOSIS — Z006 Encounter for examination for normal comparison and control in clinical research program: Secondary | ICD-10-CM | POA: Diagnosis not present

## 2015-09-09 DIAGNOSIS — E785 Hyperlipidemia, unspecified: Secondary | ICD-10-CM | POA: Diagnosis present

## 2015-09-09 DIAGNOSIS — R29703 NIHSS score 3: Secondary | ICD-10-CM | POA: Diagnosis present

## 2015-09-09 DIAGNOSIS — I252 Old myocardial infarction: Secondary | ICD-10-CM | POA: Diagnosis not present

## 2015-09-09 DIAGNOSIS — Z823 Family history of stroke: Secondary | ICD-10-CM

## 2015-09-09 DIAGNOSIS — F419 Anxiety disorder, unspecified: Secondary | ICD-10-CM | POA: Diagnosis present

## 2015-09-09 DIAGNOSIS — Z87891 Personal history of nicotine dependence: Secondary | ICD-10-CM | POA: Diagnosis not present

## 2015-09-09 DIAGNOSIS — I639 Cerebral infarction, unspecified: Secondary | ICD-10-CM | POA: Diagnosis not present

## 2015-09-09 DIAGNOSIS — Z955 Presence of coronary angioplasty implant and graft: Secondary | ICD-10-CM

## 2015-09-09 DIAGNOSIS — G8929 Other chronic pain: Secondary | ICD-10-CM | POA: Diagnosis present

## 2015-09-09 DIAGNOSIS — Z882 Allergy status to sulfonamides status: Secondary | ICD-10-CM

## 2015-09-09 DIAGNOSIS — N429 Disorder of prostate, unspecified: Secondary | ICD-10-CM | POA: Diagnosis present

## 2015-09-09 DIAGNOSIS — I2510 Atherosclerotic heart disease of native coronary artery without angina pectoris: Secondary | ICD-10-CM | POA: Diagnosis present

## 2015-09-09 DIAGNOSIS — R4781 Slurred speech: Secondary | ICD-10-CM | POA: Diagnosis present

## 2015-09-09 DIAGNOSIS — Z7982 Long term (current) use of aspirin: Secondary | ICD-10-CM

## 2015-09-09 DIAGNOSIS — R471 Dysarthria and anarthria: Secondary | ICD-10-CM | POA: Diagnosis present

## 2015-09-09 DIAGNOSIS — M549 Dorsalgia, unspecified: Secondary | ICD-10-CM | POA: Diagnosis present

## 2015-09-09 DIAGNOSIS — E876 Hypokalemia: Secondary | ICD-10-CM | POA: Diagnosis present

## 2015-09-09 DIAGNOSIS — Z881 Allergy status to other antibiotic agents status: Secondary | ICD-10-CM

## 2015-09-09 DIAGNOSIS — R260 Ataxic gait: Secondary | ICD-10-CM | POA: Diagnosis present

## 2015-09-09 DIAGNOSIS — Z880 Allergy status to penicillin: Secondary | ICD-10-CM | POA: Diagnosis not present

## 2015-09-09 DIAGNOSIS — I638 Other cerebral infarction: Secondary | ICD-10-CM | POA: Diagnosis present

## 2015-09-09 DIAGNOSIS — I6789 Other cerebrovascular disease: Secondary | ICD-10-CM | POA: Diagnosis not present

## 2015-09-09 DIAGNOSIS — Z8042 Family history of malignant neoplasm of prostate: Secondary | ICD-10-CM

## 2015-09-09 DIAGNOSIS — I6302 Cerebral infarction due to thrombosis of basilar artery: Secondary | ICD-10-CM | POA: Diagnosis not present

## 2015-09-09 LAB — CBC
HCT: 41.1 % (ref 39.0–52.0)
HEMOGLOBIN: 13.6 g/dL (ref 13.0–17.0)
MCH: 31.1 pg (ref 26.0–34.0)
MCHC: 33.1 g/dL (ref 30.0–36.0)
MCV: 94.1 fL (ref 78.0–100.0)
Platelets: 239 10*3/uL (ref 150–400)
RBC: 4.37 MIL/uL (ref 4.22–5.81)
RDW: 12.6 % (ref 11.5–15.5)
WBC: 5.6 10*3/uL (ref 4.0–10.5)

## 2015-09-09 LAB — DIFFERENTIAL
Basophils Absolute: 0 10*3/uL (ref 0.0–0.1)
Basophils Relative: 0 %
EOS PCT: 2 %
Eosinophils Absolute: 0.1 10*3/uL (ref 0.0–0.7)
LYMPHS ABS: 0.9 10*3/uL (ref 0.7–4.0)
LYMPHS PCT: 17 %
Monocytes Absolute: 0.5 10*3/uL (ref 0.1–1.0)
Monocytes Relative: 10 %
NEUTROS ABS: 3.9 10*3/uL (ref 1.7–7.7)
Neutrophils Relative %: 71 %

## 2015-09-09 LAB — I-STAT CHEM 8, ED
BUN: 13 mg/dL (ref 6–20)
CALCIUM ION: 1.12 mmol/L (ref 1.12–1.23)
CHLORIDE: 108 mmol/L (ref 101–111)
CREATININE: 0.8 mg/dL (ref 0.61–1.24)
GLUCOSE: 102 mg/dL — AB (ref 65–99)
HCT: 41 % (ref 39.0–52.0)
Hemoglobin: 13.9 g/dL (ref 13.0–17.0)
POTASSIUM: 3.5 mmol/L (ref 3.5–5.1)
Sodium: 143 mmol/L (ref 135–145)
TCO2: 22 mmol/L (ref 0–100)

## 2015-09-09 LAB — LIPID PANEL
CHOL/HDL RATIO: 4.8 ratio
Cholesterol: 158 mg/dL (ref 0–200)
HDL: 33 mg/dL — ABNORMAL LOW (ref >40)
LDL CALC: 105 mg/dL — AB (ref 0–99)
Triglycerides: 100 mg/dL (ref <150)
VLDL: 20 mg/dL (ref 0–40)

## 2015-09-09 LAB — COMPREHENSIVE METABOLIC PANEL
ALBUMIN: 3.5 g/dL (ref 3.5–5.0)
ALK PHOS: 45 U/L (ref 38–126)
ALT: 22 U/L (ref 17–63)
ANION GAP: 5 (ref 5–15)
AST: 21 U/L (ref 15–41)
BILIRUBIN TOTAL: 1.3 mg/dL — AB (ref 0.3–1.2)
BUN: 11 mg/dL (ref 6–20)
CALCIUM: 8.8 mg/dL — AB (ref 8.9–10.3)
CO2: 23 mmol/L (ref 22–32)
CREATININE: 0.79 mg/dL (ref 0.61–1.24)
Chloride: 112 mmol/L — ABNORMAL HIGH (ref 101–111)
GFR calc Af Amer: >60 mL/min (ref >60)
GFR calc non Af Amer: >60 mL/min (ref >60)
GLUCOSE: 100 mg/dL — AB (ref 65–99)
Potassium: 3.5 mmol/L (ref 3.5–5.1)
Sodium: 140 mmol/L (ref 135–145)
TOTAL PROTEIN: 6.5 g/dL (ref 6.5–8.1)

## 2015-09-09 LAB — PROTIME-INR
INR: 1.12 (ref 0.00–1.49)
Prothrombin Time: 14.6 seconds (ref 11.6–15.2)

## 2015-09-09 LAB — APTT: aPTT: 30 seconds (ref 24–37)

## 2015-09-09 LAB — I-STAT TROPONIN, ED: Troponin i, poc: 0 ng/mL (ref 0.00–0.08)

## 2015-09-09 LAB — CBG MONITORING, ED: Glucose-Capillary: 99 mg/dL (ref 65–99)

## 2015-09-09 MED ORDER — ENOXAPARIN SODIUM 40 MG/0.4ML ~~LOC~~ SOLN
40.0000 mg | SUBCUTANEOUS | Status: DC
  Administered 2015-09-09 – 2015-09-10 (×2): 40 mg via SUBCUTANEOUS
  Filled 2015-09-09 (×2): qty 0.4

## 2015-09-09 MED ORDER — ALPRAZOLAM 0.25 MG PO TABS
0.2500 mg | ORAL_TABLET | Freq: Every evening | ORAL | Status: DC | PRN
  Administered 2015-09-09 – 2015-09-10 (×2): 0.25 mg via ORAL
  Filled 2015-09-09 (×2): qty 1

## 2015-09-09 MED ORDER — PANTOPRAZOLE SODIUM 40 MG PO TBEC: 40.0000 mg | DELAYED_RELEASE_TABLET | Freq: Every day | ORAL | Status: DC

## 2015-09-09 MED ORDER — SODIUM CHLORIDE 0.9 % IV SOLN
INTRAVENOUS | Status: AC
  Administered 2015-09-09: 18:00:00 via INTRAVENOUS

## 2015-09-09 MED ORDER — ACETAMINOPHEN 325 MG PO TABS
650.0000 mg | ORAL_TABLET | ORAL | Status: DC | PRN
  Administered 2015-09-09 – 2015-09-10 (×2): 650 mg via ORAL
  Filled 2015-09-09 (×2): qty 2

## 2015-09-09 MED ORDER — TICAGRELOR 90 MG PO TABS
90.0000 mg | ORAL_TABLET | Freq: Two times a day (BID) | ORAL | Status: DC
  Administered 2015-09-09 – 2015-09-11 (×4): 90 mg via ORAL
  Filled 2015-09-09 (×4): qty 1

## 2015-09-09 MED ORDER — STROKE: EARLY STAGES OF RECOVERY BOOK
Freq: Once | Status: AC
  Administered 2015-09-09: 15:00:00
  Filled 2015-09-09: qty 1

## 2015-09-09 MED ORDER — POLYETHYLENE GLYCOL 3350 17 G PO PACK: 17.0000 g | PACK | Freq: Every day | ORAL | Status: DC | PRN

## 2015-09-09 MED ORDER — ROSUVASTATIN CALCIUM 10 MG PO TABS: 10.0000 mg | ORAL_TABLET | ORAL | Status: DC

## 2015-09-09 MED ORDER — ASPIRIN 300 MG RE SUPP: 300.0000 mg | Freq: Every day | RECTAL | Status: DC

## 2015-09-09 MED ORDER — TRAMADOL HCL 50 MG PO TABS: 50.0000 mg | ORAL_TABLET | Freq: Two times a day (BID) | ORAL | Status: DC | PRN

## 2015-09-09 MED ORDER — SENNOSIDES-DOCUSATE SODIUM 8.6-50 MG PO TABS: 1.0000 | ORAL_TABLET | Freq: Every evening | ORAL | Status: DC | PRN

## 2015-09-09 MED ORDER — PANTOPRAZOLE SODIUM 40 MG PO TBEC
40.0000 mg | DELAYED_RELEASE_TABLET | Freq: Every day | ORAL | Status: DC
  Administered 2015-09-10 – 2015-09-11 (×2): 40 mg via ORAL
  Filled 2015-09-09 (×2): qty 1

## 2015-09-09 MED ORDER — FLUTICASONE PROPIONATE 50 MCG/ACT NA SUSP
1.0000 | Freq: Every day | NASAL | Status: DC | PRN
  Filled 2015-09-09: qty 16

## 2015-09-09 MED ORDER — POLYVINYL ALCOHOL 1.4 % OP SOLN
1.0000 [drp] | Freq: Three times a day (TID) | OPHTHALMIC | Status: DC | PRN
  Administered 2015-09-10: 1 [drp] via OPHTHALMIC
  Filled 2015-09-09: qty 15

## 2015-09-09 MED ORDER — HYPROMELLOSE (GONIOSCOPIC) 2.5 % OP SOLN: 1.0000 [drp] | Freq: Three times a day (TID) | OPHTHALMIC | Status: DC | PRN

## 2015-09-09 MED ORDER — ACETAMINOPHEN 650 MG RE SUPP: 650.0000 mg | RECTAL | Status: DC | PRN

## 2015-09-09 NOTE — Progress Notes (Signed)
Pt passed swallow screen. New diet order placed.

## 2015-09-09 NOTE — Consult Note (Signed)
Neurology Consult Note  Reason for Consultation: Ischemic stroke  Requesting provider: Veryl Speak, MD  CC: Slurred speech  HPI: This is a 82 year old right-handed man who presents to the [facility] emergency Department complaining of slurred speech and an unsteady gait. History is obtained from the patient and his wife was present for the encounter. His wife reports that his symptoms began on the evening of 09/07/15. He noted that he was having some slurred speech. This occurred on an intermittent basis over the past couple of days. Yesterday evening he noticed that he is having some difficulty walking in a straight line as he was walking across the room. He did not appreciate any weakness, vision changes, difficulty swallowing, or coordination problems.   He did have some numbness in both of his arms, but this developed after he started using a new backrest which he obtained from his chiropractor. He states that the numbness developed while he was sitting in his chair with this backrest behind him. When he removed the backrest, the numbness went away fairly quickly. He states that he has been seeing a chiropractor because of severe pain from his arthritis and from chronic back problems. The treatment from his chiropractor has consisted of some adjustments to his lumbar spine and some massage in the shoulders and thoracic region. He states that he has not had any adjustments of his neck.  He presented to the emergency department today for further evaluation. An MRI scan of the brain was obtained and reportedly showed an acute infarction in the pons. He has been admitted for stroke evaluation.  PMH:  Past Medical History  Diagnosis Date  . Arthritis   . Cataract   . Prostate disease   . Skin cancer   . Hypertension   . Shoulder pain   . Reflux   . Chronic back pain   . Loss of balance   . HLD (hyperlipidemia)   . Anxiety   . Sepsis [facility]) February 2016    Pt stated he was treated at [facility] for this   . Prostate cancer screening     "for several years"  . CAD (coronary artery disease)     a. 05/2015: STEMI: 99% stenosis of Posterolateral w/ Synergy DES placed.  Marland Kitchen History of nuclear stress test     ETT-Myoview 5/17: EF 54%, inf scar, no ischemia; Low Risk    PSH:  Past Surgical History  Procedure Laterality Date  . Vasectomy    . Appendectomy    . Colon surgery    . Hand surgery Left   . Cardiac catheterization N/A 05/31/2015    Procedure: Left Heart Cath and Coronary Angiography;  Surgeon: Jettie Booze, MD;  Location: [place] [facility];  Service: Cardiovascular;  Laterality: N/A;  . Cardiac catheterization N/A 05/31/2015    Procedure: Coronary Stent Intervention;  Surgeon: Jettie Booze, MD;  Location: [place] [facility];  Service: Cardiovascular;  Laterality: N/A;    Family history: Family History  Problem Relation Age of Onset  . Stroke Mother   . Prostate cancer Father   . Breast cancer Daughter   . ALS Brother     Social history:  Social History   Social History  . Marital Status: Married    Spouse Name: [name]  . Number of Children: 2  . Years of Education: college   Occupational History  . Retired- Network engineer work     Social History Main Topics  . Smoking status: Former  Smoker -- 1.00 packs/day for 10 years    Types: Cigarettes    Quit date: 03/05/1972  . Smokeless tobacco: Never Used  . Alcohol Use: 0.6 oz/week    1 Cans of beer per week     Comment: Once a month  . Drug Use: No  . Sexual Activity: Yes   Other Topics Concern  . Not on file   Social History Narrative   Patient lives at home with his spouse. [name])   Caffeine Use: 2 cups of coffee daily   Retired    [employer]   Right handed    Current outpatient meds: Current Meds  Medication Sig  . acetaminophen (TYLENOL) 500 MG tablet Take 1,000 mg by mouth at bedtime.  . ALPRAZolam (XANAX) 0.5 MG tablet Take 0.25 mg by mouth at bedtime as needed for  anxiety.   Marland Kitchen aspirin EC 81 MG tablet Take 81 mg by mouth daily.  . fluticasone (FLONASE) 50 MCG/ACT nasal spray Place 1 spray into the nose daily as needed for allergies.   . hydroxypropyl methylcellulose / hypromellose (ISOPTO TEARS / GONIOVISC) 2.5 % ophthalmic solution Place 1 drop into both eyes 3 (three) times daily as needed for dry eyes.  Marland Kitchen omeprazole (PRILOSEC) 20 MG capsule Take 20 mg by mouth daily.   . polyethylene glycol (MIRALAX) packet Take 17 g by mouth daily as needed for moderate constipation.   . rosuvastatin (CRESTOR) 10 MG tablet Take 1 tablet (10 mg total) by mouth once a week.  . ticagrelor (BRILINTA) 90 MG TABS tablet Take 1 tablet (90 mg total) by mouth 2 (two) times daily.  . valsartan (DIOVAN) 40 MG tablet Take 40 mg by mouth daily.    Current inpatient meds:  Current Facility-Administered Medications  Medication Dose Route Frequency Provider Last Rate Last Dose  . 0.9 %  sodium chloride infusion   Intravenous Continuous Radene Gunning, NP      . acetaminophen (TYLENOL) tablet 650 mg  650 mg Oral Q4H PRN Radene Gunning, NP       Or  . acetaminophen (TYLENOL) suppository 650 mg  650 mg Rectal Q4H PRN Radene Gunning, NP      . ALPRAZolam Duanne Moron) tablet 0.25 mg  0.25 mg Oral QHS PRN Radene Gunning, NP      . aspirin suppository 300 mg  300 mg Rectal Daily Lezlie Octave Black, NP      . enoxaparin (LOVENOX) injection 40 mg  40 mg Subcutaneous Q24H Lezlie Octave Black, NP      . fluticasone [facility]) 50 MCG/ACT nasal spray 1 spray  1 spray Each Nare Daily PRN Radene Gunning, NP      . Derrill Memo ON 09/10/2015] pantoprazole (PROTONIX) EC tablet 40 mg  40 mg Oral Daily Jake Church Masters, RPH      . polyethylene glycol (MIRALAX / GLYCOLAX) packet 17 g  17 g Oral Daily PRN Radene Gunning, NP      . polyvinyl alcohol (LIQUIFILM TEARS) 1.4 % ophthalmic solution 1 drop  1 drop Both Eyes TID PRN Jake Church Masters, [facility]      . [START ON 09/12/2015] rosuvastatin (CRESTOR) tablet 10 mg  10 mg Oral Weekly  Lezlie Octave Black, NP      . senna-docusate (Senokot-S) tablet 1 tablet  1 tablet Oral QHS PRN Radene Gunning, NP      . traMADol Veatrice Bourbon) tablet 50 mg  50 mg Oral Q12H PRN Radene Gunning, NP  Allergies: Allergies  Allergen Reactions  . Doxycycline Itching  . Levaquin [Levofloxacin In D5w] Other (See Comments)    Joint swelling  . Lipitor [Atorvastatin] Nausea And Vomiting  . Lisinopril Other (See Comments)    unknown  . Niaspan [Niacin] Other (See Comments)    Joint swelling  . Penicillins Swelling    2/19 Pt states he had Penicillin challenge that was negative for reaction.  . Sulfa Antibiotics Itching  . Vytorin [Ezetimibe-Simvastatin] Nausea And Vomiting  . Clindamycin/Lincomycin Rash  . Zithromax [Azithromycin] Rash    ROS: As per HPI. A full 14-point review of systems was performed and is otherwise unremarkable.  PE:  BP 159/75 mmHg  Pulse 57  Temp(Src) 97.7 F (36.5 C) (Oral)  Resp 18  Ht 6' (1.829 m)  Wt 89.812 kg (198 lb)  BMI 26.85 kg/m2  SpO2 98%  General: WDWN, no acute distress. AAO x4. Speech clear, no dysarthria. No aphasia. Follows commands briskly. Affect is bright with congruent mood. Comportment is normal.  HEENT: Normocephalic. Neck supple without LAD. MMM, OP clear. Dentition good. Sclerae anicteric. No conjunctival injection.  CV: Regular, no murmur. Carotid pulses full and symmetric, no bruits. Distal pulses 2+ and symmetric.  Lungs: CTAB.  Abdomen: Soft, non-distended, non-tender. Bowel sounds present x4.  Extremities: No C/C/E. Neuro:  CN: Pupils are equal and round. They are symmetrically reactive from 3-->2 mm. EOMI without nystagmus. No reported diplopia. Facial sensation is intact to light touch. Face is symmetric at rest with normal strength an mobility. Hearing is intact to conversational voice. Palate elevates symmetrically and uvula is midline. Voice is normal in tone, pitch and quality. Bilateral SCM and trapezii are 5/5. Tongue is midline  with normal bulk and mobility.  Motor: Normal bulk, tone, and strength. No tremor or other abnormal movements. No drift.  Sensation: intact to light touch, pinprick, vibration, and joint position.  Coordination: Finger-to-nose and heel-to-shin are without dysmetria. Finger taps are normal in amplitude and speed, no decrement.   Labs:  Lab Results  Component Value Date   WBC 5.6 09/09/2015   HGB 13.9 09/09/2015   HCT 41.0 09/09/2015   PLT 239 09/09/2015   GLUCOSE 102* 09/09/2015   CHOL 158 09/09/2015   TRIG 100 09/09/2015   HDL 33* 09/09/2015   LDLCALC 105* 09/09/2015   ALT 22 09/09/2015   AST 21 09/09/2015   NA 143 09/09/2015   K 3.5 09/09/2015   CL 108 09/09/2015   CREATININE 0.80 09/09/2015   BUN 13 09/09/2015   CO2 23 09/09/2015   TSH 1.424 05/31/2015   INR 1.12 09/09/2015   HGBA1C 5.8* 05/31/2015    Imaging:  I have personally and independently reviewed the MRI scan of the brain without contrast from today. This shows restricted diffusion within the left paramedian pons consistent with an acute ischemic infarction. He shows a few punctate areas of T2/FLAIR hyperintensity in the bihemispheric white matter suggestive of chronic small vessel ischemic changes.  MRA of the head performed today reveals no evidence of intracranial arterial occlusion or significant stenosis.   Assessment and Plan:  1. Acute Ischemic Stroke: This is an acute stroke involving the basilar artery territory, specifically one of the pontine perforators. It is most likely thrombotic in etiology. Known risk factors for cerebrovascular disease in this patient include hypertension, coronary artery disease, age. Carotid Dopplers and transthoracic echocardiogram will be obtained to complete his stroke evaluation. Recommend antiplatelet therapy with aspirin for secondary stroke prevention once cleared to take  oral medications. He has been taking Brilinta under the care of his cardiologist and was taking this at  the time of his stroke. Consideration could be given to adding a low-dose aspirin though must carefully weighed the increased risk of bleeding by combining these therapies. Continue statin with goal LDL less than 70. Ensure adequate glucose control. Allow permissive hypertension in the acute phase, gradually lowering BP to goal after 24 hours as needed. Avoid fever and hyperglycemia as these can extend the infarct. Avoid hypotonic IVF to minimize exacerbation of post-stroke edema. Initiate rehab services. DVT prophylaxis as needed.    2. Dysarthria: He has had some intermittent dysarthria since onset of symptoms 2 days ago. He is not having any dysarthria on his current examination. This can be followed. He will need a swallow evaluation before being cleared for oral intake.  3. Bilateral arm numbness: This is not explained by his current stroke. He reports that this was associated with use of a new back support provided by his chiropractor and seem to improve when he stopped using the back support. He was advised not to continue using his back support.  Thank you for this consultation. This was discussed with the patient and his wife who is present at the bedside. They were given the opportunity to ask any questions and these were addressed to their satisfaction. The stroke team will assume care of this patient on 7/8.

## 2015-09-09 NOTE — H&P (Signed)
History and Physical    Peter [name] [id number] DOB: 01-15-1934 DOA: 09/09/2015  PCP: Chesley Noon, MD Patient coming from: home  Chief Complaint: bilateral arm numbness/slurred speech/unsteady gait  HPI: Peter [name] is a very pleasant 82 y.o. male with medical history significant for hypertension, CAD status post stent earlier this year, hyperlipidemia, chronic back pain presents from PCP office with the chief complaint of slurred speech and unsteady gait. Initial evaluation in the ED includes an MRI revealing acute infarct.  Information is obtained from the patient and his wife who is at the bedside. He states 3 or 4 days ago he experienced bilateral upper extremity numbness. Yesterday he noticed intermittent slurred speech and one episode of difficulty forming his words. Later yesterday he developed a "wobbly" gait. He denied any headache visual disturbances weakness of extremities. He denied any numbness or tingling of his lower extremities. He denied any difficulties chewing or swallowing. He denied chest pain palpitation shortness of breath abdominal pain nausea vomiting. He denies any dysuria hematuria frequency or urgency. He denies any fever chills recent travel or sick contacts. Does report taking Brilinta. He states nothing made symptoms better or worse.    ED Course: In the emergency department he's afebrile hemodynamically stable and not hypoxic.  Review of Systems: As per HPI otherwise 10 point review of systems negative.   Ambulatory Status: Recent development of unsteady gait  Past Medical History  Diagnosis Date  . Arthritis   . Cataract   . Prostate disease   . Skin cancer   . Hypertension   . Shoulder pain   . Reflux   . Chronic back pain   . Loss of balance   . HLD (hyperlipidemia)   . Anxiety   . Sepsis [facility]) February 2016    Pt stated he was treated at [facility] for this   . Prostate cancer screening     "for several years"  . CAD  (coronary artery disease)     a. 05/2015: STEMI: 99% stenosis of Posterolateral w/ Synergy DES placed.  Marland Kitchen History of nuclear stress test     ETT-Myoview 5/17: EF 54%, inf scar, no ischemia; Low Risk    Past Surgical History  Procedure Laterality Date  . Vasectomy    . Appendectomy    . Colon surgery    . Hand surgery Left   . Cardiac catheterization N/A 05/31/2015    Procedure: Left Heart Cath and Coronary Angiography;  Surgeon: Jettie Booze, MD;  Location: [place] [facility];  Service: Cardiovascular;  Laterality: N/A;  . Cardiac catheterization N/A 05/31/2015    Procedure: Coronary Stent Intervention;  Surgeon: Jettie Booze, MD;  Location: [place] [facility];  Service: Cardiovascular;  Laterality: N/A;    Social History   Social History  . Marital Status: Married    Spouse Name: [name]  . Number of Children: 2  . Years of Education: college   Occupational History  . Retired- Network engineer work     Social History Main Topics  . Smoking status: Former Smoker -- 1.00 packs/day for 10 years    Types: Cigarettes    Quit date: 03/05/1972  . Smokeless tobacco: Never Used  . Alcohol Use: 0.6 oz/week    1 Cans of beer per week     Comment: Once a month  . Drug Use: No  . Sexual Activity: Yes   Other Topics Concern  . Not on file   Social History Narrative  Patient lives at home with his spouse. [name])   Caffeine Use: 2 cups of coffee daily   Retired    [place]   Right handed    Allergies  Allergen Reactions  . Doxycycline Itching  . Levaquin [Levofloxacin In D5w] Other (See Comments)    Joint swelling  . Lipitor [Atorvastatin] Nausea And Vomiting  . Lisinopril Other (See Comments)    unknown  . Niaspan [Niacin] Other (See Comments)    Joint swelling  . Penicillins Swelling    2/19 Pt states he had Penicillin challenge that was negative for reaction.  . Sulfa Antibiotics Itching  . Vytorin [Ezetimibe-Simvastatin] Nausea And Vomiting  .  Clindamycin/Lincomycin Rash  . Zithromax [Azithromycin] Rash    Family History  Problem Relation Age of Onset  . Stroke Mother   . Prostate cancer Father   . Breast cancer Daughter   . ALS Brother     Prior to Admission medications   Medication Sig Start Date End Date Taking? Authorizing Provider  acetaminophen (TYLENOL) 500 MG tablet Take 1,000 mg by mouth at bedtime.   Yes Historical Provider, MD  ALPRAZolam Duanne Moron) 0.5 MG tablet Take 0.25 mg by mouth at bedtime as needed for anxiety.    Yes Historical Provider, MD  aspirin EC 81 MG tablet Take 81 mg by mouth daily.   Yes Historical Provider, MD  fluticasone (FLONASE) 50 MCG/ACT nasal spray Place 1 spray into the nose daily as needed for allergies.  01/28/13  Yes Historical Provider, MD  hydroxypropyl methylcellulose / hypromellose (ISOPTO TEARS / GONIOVISC) 2.5 % ophthalmic solution Place 1 drop into both eyes 3 (three) times daily as needed for dry eyes.   Yes Historical Provider, MD  omeprazole (PRILOSEC) 20 MG capsule Take 20 mg by mouth daily.    Yes Historical Provider, MD  polyethylene glycol (MIRALAX) packet Take 17 g by mouth daily as needed for moderate constipation.    Yes Historical Provider, MD  rosuvastatin (CRESTOR) 10 MG tablet Take 1 tablet (10 mg total) by mouth once a week. 08/19/15  Yes Jettie Booze, MD  ticagrelor (BRILINTA) 90 MG TABS tablet Take 1 tablet (90 mg total) by mouth 2 (two) times daily. 06/01/15  Yes Erma Heritage, PA  valsartan (DIOVAN) 40 MG tablet Take 40 mg by mouth daily.   Yes Historical Provider, MD  nitroGLYCERIN (NITROSTAT) 0.4 MG SL tablet Place 1 tablet (0.4 mg total) under the tongue every 5 (five) minutes x 3 doses as needed for chest pain. 06/01/15   Erma Heritage, PA  traMADol (ULTRAM) 50 MG tablet Take 1 tablet (50 mg total) by mouth every 12 (twelve) hours as needed. 09/07/15   Thurman Coyer, DO    Physical Exam: Filed Vitals:   09/09/15 1432 09/09/15 1445 09/09/15 1450  09/09/15 1500  BP: 166/82 175/90  163/81  Pulse: 61 59  60  Temp:   97.8 F (36.6 C)   TempSrc:      Resp: 19 16  20   Height:      Weight:      SpO2: 98% 99%  99%     General:  Appears calm and comfortable, sitting up in bed Eyes:  PERRL, EOMI, normal lids, iris ENT:  grossly normal hearing, lips & tongue, his membranes of his mouth are pink and moist Neck:  no LAD, masses or thyromegaly Cardiovascular:  RRR, no m/r/g. No LE edema. Pedal pulses present and palpable Respiratory:  CTA bilaterally, no w/r/r.  Normal respiratory effort. Abdomen:  soft, ntnd, positive bowel sounds no guarding or rebounding Skin:  no rash or induration seen on limited exam Musculoskeletal:  grossly normal tone BUE/BLE, good ROM, no bony abnormality Psychiatric:  grossly normal mood and affect, speech fluent and appropriate, AOx3 Neurologic:  CN 2-12 grossly intact, moves all extremities in coordinated fashion, sensation intact no pronator drift lower extremity strength 5 out of 5 bilateral upper extremity strength 5 out of 5 tongue midline  Labs on Admission: I have personally reviewed following labs and imaging studies  CBC:  Recent Labs Lab 09/09/15 1122 09/09/15 1136  WBC 5.6  --   NEUTROABS 3.9  --   HGB 13.6 13.9  HCT 41.1 41.0  MCV 94.1  --   PLT 239  --    Basic Metabolic Panel:  Recent Labs Lab 09/09/15 1122 09/09/15 1136  NA 140 143  K 3.5 3.5  CL 112* 108  CO2 23  --   GLUCOSE 100* 102*  BUN 11 13  CREATININE 0.79 0.80  CALCIUM 8.8*  --    GFR: Estimated Creatinine Clearance: 82.2 mL/min (by C-G formula based on Cr of 0.8). Liver Function Tests:  Recent Labs Lab 09/09/15 1122  AST 21  ALT 22  ALKPHOS 45  BILITOT 1.3*  PROT 6.5  ALBUMIN 3.5   No results for input(s): LIPASE, AMYLASE in the last 168 hours. No results for input(s): AMMONIA in the last 168 hours. Coagulation Profile:  Recent Labs Lab 09/09/15 1122  INR 1.12   Cardiac Enzymes: No results  for input(s): CKTOTAL, CKMB, CKMBINDEX, TROPONINI in the last 168 hours. BNP (last 3 results) No results for input(s): PROBNP in the last 8760 hours. HbA1C: No results for input(s): HGBA1C in the last 72 hours. CBG:  Recent Labs Lab 09/09/15 1235  GLUCAP 99   Lipid Profile:  Recent Labs  09/09/15 1122  CHOL 158  HDL 33*  LDLCALC 105*  TRIG 100  CHOLHDL 4.8   Thyroid Function Tests: No results for input(s): TSH, T4TOTAL, FREET4, T3FREE, THYROIDAB in the last 72 hours. Anemia Panel: No results for input(s): VITAMINB12, FOLATE, FERRITIN, TIBC, IRON, RETICCTPCT in the last 72 hours. Urine analysis:    Component Value Date/Time   COLORURINE YELLOW 04/21/2014 1906   APPEARANCEUR CLEAR 04/21/2014 1906   LABSPEC 1.019 04/21/2014 1906   PHURINE 5.5 04/21/2014 1906   GLUCOSEU NEGATIVE 04/21/2014 1906   HGBUR NEGATIVE 04/21/2014 1906   BILIRUBINUR NEGATIVE 04/21/2014 1906   BILIRUBINUR neg 03/22/2012 1844   KETONESUR NEGATIVE 04/21/2014 1906   PROTEINUR NEGATIVE 04/21/2014 1906   PROTEINUR neg 03/22/2012 1844   UROBILINOGEN 1.0 04/21/2014 1906   UROBILINOGEN 0.2 03/22/2012 1844   NITRITE NEGATIVE 04/21/2014 1906   NITRITE neg 03/22/2012 1844   LEUKOCYTESUR NEGATIVE 04/21/2014 1906    Creatinine Clearance: Estimated Creatinine Clearance: 82.2 mL/min (by C-G formula based on Cr of 0.8).  Sepsis Labs: @LABRCNTIP (procalcitonin:4,lacticidven:4) )No results found for this or any previous visit (from the past 240 hour(s)).   Radiological Exams on Admission: Mr Herby Abraham Contrast  09/09/2015  CLINICAL DATA:  82 year old male with slurred speech beginning 2 days ago. Bilateral arm numbness for 1 week. Initial encounter. EXAM: MRI HEAD WITHOUT CONTRAST TECHNIQUE: Multiplanar, multiecho pulse sequences of the brain and surrounding structures were obtained without intravenous contrast. COMPARISON:  Brain MRI 11/17/2012. FINDINGS: Major intracranial vascular flow voids are stable. 11  mm area of confluent restricted diffusion in the left paracentral pons (series 3, image 11).  Mild associated T2 and FLAIR hyperintensity with no associated hemorrhage or mass effect. No other restricted diffusion. No midline shift, mass effect, evidence of mass lesion, ventriculomegaly, extra-axial collection or acute intracranial hemorrhage. Cervicomedullary junction and pituitary are within normal limits. Negative visualized cervical spine. Outside of the acute findings gray and white matter signal throughout the brain is stable and normal for age. Visible internal auditory structures appear normal. Visualized paranasal sinuses and mastoids are stable and well pneumatized. Negative orbit and scalp soft tissues. Normal bone marrow signal. IMPRESSION: 1. Small acute infarct in the left paracentral pons (basilar artery perforator branch territory). No associated hemorrhage or mass effect. 2. Otherwise normal for age noncontrast MRI appearance of the brain. Electronically Signed   By: Genevie Ann M.D.   On: 09/09/2015 13:56    EKG: Independently reviewed. Sinus rhythm Borderline prolonged PR interval Left axis deviation Low voltage, precordial leads Consider anterior infarct Borderline T abnormalities, inferior leads  Assessment/Plan Principal Problem:   CVA (cerebral infarction) Active Problems:   HBP (high blood pressure)   Essential hypertension   HLD (hyperlipidemia)   CAD (coronary artery disease)   #1. CVA. MRI reveals small acute infarct in the left paracentral pons with no associated hemorrhage or mass effect. -Admit to telemetry -Obtain 2-D echo carotid Dopplers -MRA of the head -Chest x-ray -Lipid panel hemoglobin A1c -PT -OT -Neuro consult -Aspirin, statin -Hold Brilinta until evaluated by neurology  #2. Hypertension. Fair control in the emergency department. Home medications include Valsartan -hold valsartan for now -monitor  3. Hyperlipidemia. -Obtain lipid panel -Continue  statin  #4. CAD. Admitted March of this year with inferior STEMI. Cardiology note indicates L HCC demonstrated 99% stenosis in the posterior lateral artery treated with Synergy DES and mild to moderate nonobstructive disease elsewhere. EF was preserved by echocardiogram. Note indicates he was due for a prostate biopsy and it was recommended that he remain on dual antiplatelet therapy for at least 3 months minimum. Denies chest pain. Initial troponin negative      DVT prophylaxis: Lovenox Code Status: full Family Communication: wife at bedside  Disposition Plan: home  Consults called: neuro  Admission status: inpatient   Radene Gunning MD [facility]  If 7PM-7AM, please contact night-coverage [email or web address] Password [email or web address]  09/09/2015, 3:11 PM

## 2015-09-09 NOTE — ED Notes (Signed)
PT returns from MRI at this time.

## 2015-09-09 NOTE — ED Provider Notes (Signed)
CSN: [id number]     Arrival date & time 09/09/15  1023 History   First MD Initiated Contact with Patient 09/09/15 1047     Chief Complaint  Patient presents with  . Cerebrovascular Accident     (Consider location/radiation/quality/duration/timing/severity/associated sxs/prior Treatment) HPI Comments: Patient is a 82 year old male with history of CAD with stent placement earlier this year, hypertension. He presents today for evaluation of slurred speech and difficulty forming his words. This started 2 days ago. He notified his primary doctor's office who told him to come to the ER to be evaluated. He denies any weakness of the arms or legs. He does report feeling somewhat off balance. He is currently taking Brilinta.  Patient is a 82 y.o. male presenting with Acute Neurological Problem. The history is provided by the patient.  Cerebrovascular Accident This is a new problem. The current episode started 2 days ago. The problem occurs constantly. The problem has not changed since onset.Pertinent negatives include no chest pain and no headaches. Nothing aggravates the symptoms. Nothing relieves the symptoms. He has tried nothing for the symptoms. The treatment provided no relief.    Past Medical History  Diagnosis Date  . Arthritis   . Cataract   . Prostate disease   . Skin cancer   . Hypertension   . Shoulder pain   . Reflux   . Chronic back pain   . Loss of balance   . HLD (hyperlipidemia)   . Anxiety   . Sepsis [facility]) February 2016    Pt stated he was treated at [facility] for this   . Prostate cancer screening     "for several years"  . CAD (coronary artery disease)     a. 05/2015: STEMI: 99% stenosis of Posterolateral w/ Synergy DES placed.  Marland Kitchen History of nuclear stress test     ETT-Myoview 5/17: EF 54%, inf scar, no ischemia; Low Risk   Past Surgical History  Procedure Laterality Date  . Vasectomy    . Appendectomy    . Colon surgery    . Hand surgery Left   . Cardiac  catheterization N/A 05/31/2015    Procedure: Left Heart Cath and Coronary Angiography;  Surgeon: Jettie Booze, MD;  Location: [place] [facility];  Service: Cardiovascular;  Laterality: N/A;  . Cardiac catheterization N/A 05/31/2015    Procedure: Coronary Stent Intervention;  Surgeon: Jettie Booze, MD;  Location: [place] [facility];  Service: Cardiovascular;  Laterality: N/A;   Family History  Problem Relation Age of Onset  . Stroke Mother   . Prostate cancer Father   . Breast cancer Daughter   . ALS Brother    Social History  Substance Use Topics  . Smoking status: Former Smoker -- 1.00 packs/day for 10 years    Types: Cigarettes    Quit date: 03/05/1972  . Smokeless tobacco: Never Used  . Alcohol Use: 0.6 oz/week    1 Cans of beer per week     Comment: Once a month    Review of Systems  Cardiovascular: Negative for chest pain.  Neurological: Negative for headaches.  All other systems reviewed and are negative.     Allergies  Doxycycline; Levaquin; Lipitor; Lisinopril; Niaspan; Penicillins; Sulfa antibiotics; Vytorin; Clindamycin/lincomycin; and Zithromax  Home Medications   Prior to Admission medications   Medication Sig Start Date End Date Taking? Authorizing Provider  acetaminophen (TYLENOL) 500 MG tablet Take 1,000 mg by mouth at bedtime.   Yes Historical Provider, MD  ALPRAZolam (XANAX) 0.5 MG tablet Take 0.25 mg by mouth at bedtime as needed for anxiety.    Yes Historical Provider, MD  aspirin EC 81 MG tablet Take 81 mg by mouth daily.   Yes Historical Provider, MD  fluticasone (FLONASE) 50 MCG/ACT nasal spray Place 1 spray into the nose daily as needed for allergies.  01/28/13  Yes Historical Provider, MD  hydroxypropyl methylcellulose / hypromellose (ISOPTO TEARS / GONIOVISC) 2.5 % ophthalmic solution Place 1 drop into both eyes 3 (three) times daily as needed for dry eyes.   Yes Historical Provider, MD  omeprazole (PRILOSEC) 20 MG capsule Take 20 mg by  mouth daily.    Yes Historical Provider, MD  polyethylene glycol (MIRALAX) packet Take 17 g by mouth daily as needed for moderate constipation.    Yes Historical Provider, MD  rosuvastatin (CRESTOR) 10 MG tablet Take 1 tablet (10 mg total) by mouth once a week. 08/19/15  Yes Jettie Booze, MD  ticagrelor (BRILINTA) 90 MG TABS tablet Take 1 tablet (90 mg total) by mouth 2 (two) times daily. 06/01/15  Yes Erma Heritage, PA  valsartan (DIOVAN) 40 MG tablet Take 40 mg by mouth daily.   Yes Historical Provider, MD  nitroGLYCERIN (NITROSTAT) 0.4 MG SL tablet Place 1 tablet (0.4 mg total) under the tongue every 5 (five) minutes x 3 doses as needed for chest pain. 06/01/15   Erma Heritage, PA  traMADol (ULTRAM) 50 MG tablet Take 1 tablet (50 mg total) by mouth every 12 (twelve) hours as needed. 09/07/15   Carlos Levering Draper, DO   BP 128/89 mmHg  Pulse 54  Temp(Src) 98.8 F (37.1 C) (Oral)  Resp 17  Ht 6' (1.829 m)  Wt 198 lb (89.812 kg)  BMI 26.85 kg/m2  SpO2 96% Physical Exam  Constitutional: He is oriented to person, place, and time. He appears well-developed and well-nourished. No distress.  HENT:  Head: Normocephalic and atraumatic.  Mouth/Throat: Oropharynx is clear and moist.  Eyes: EOM are normal. Pupils are equal, round, and reactive to light.  Neck: Normal range of motion. Neck supple.  Cardiovascular: Normal rate and regular rhythm.  Exam reveals no friction rub.   No murmur heard. Pulmonary/Chest: Effort normal and breath sounds normal. No respiratory distress. He has no wheezes. He has no rales.  Abdominal: Soft. Bowel sounds are normal. He exhibits no distension. There is no tenderness.  Musculoskeletal: Normal range of motion. He exhibits no edema.  Neurological: He is alert and oriented to person, place, and time. No cranial nerve deficit. He exhibits normal muscle tone. Coordination normal.  Skin: Skin is warm and dry. He is not diaphoretic.  Nursing note and vitals  reviewed.   ED Course  Procedures (including critical care time) Labs Review Labs Reviewed  COMPREHENSIVE METABOLIC PANEL - Abnormal; Notable for the following:    Chloride 112 (*)    Glucose, Bld 100 (*)    Calcium 8.8 (*)    Total Bilirubin 1.3 (*)    All other components within normal limits  I-STAT CHEM 8, ED - Abnormal; Notable for the following:    Glucose, Bld 102 (*)    All other components within normal limits  PROTIME-INR  APTT  CBC  DIFFERENTIAL  I-STAT TROPOININ, ED  CBG MONITORING, ED    Imaging Review Mr Brain Wo Contrast  09/09/2015  CLINICAL DATA:  82 year old male with slurred speech beginning 2 days ago. Bilateral arm numbness for 1 week. Initial encounter. EXAM:  MRI HEAD WITHOUT CONTRAST TECHNIQUE: Multiplanar, multiecho pulse sequences of the brain and surrounding structures were obtained without intravenous contrast. COMPARISON:  Brain MRI 11/17/2012. FINDINGS: Major intracranial vascular flow voids are stable. 11 mm area of confluent restricted diffusion in the left paracentral pons (series 3, image 11). Mild associated T2 and FLAIR hyperintensity with no associated hemorrhage or mass effect. No other restricted diffusion. No midline shift, mass effect, evidence of mass lesion, ventriculomegaly, extra-axial collection or acute intracranial hemorrhage. Cervicomedullary junction and pituitary are within normal limits. Negative visualized cervical spine. Outside of the acute findings gray and white matter signal throughout the brain is stable and normal for age. Visible internal auditory structures appear normal. Visualized paranasal sinuses and mastoids are stable and well pneumatized. Negative orbit and scalp soft tissues. Normal bone marrow signal. IMPRESSION: 1. Small acute infarct in the left paracentral pons (basilar artery perforator branch territory). No associated hemorrhage or mass effect. 2. Otherwise normal for age noncontrast MRI appearance of the brain.  Electronically Signed   By: Genevie Ann M.D.   On: 09/09/2015 13:56   I have personally reviewed and evaluated these images and lab results as part of my medical decision-making.   EKG Interpretation   Date/Time:  Friday September 09 2015 11:10:48 EDT Ventricular Rate:  57 PR Interval:    QRS Duration: 98 QT Interval:  439 QTC Calculation: 428 R Axis:   -36 Text Interpretation:  Sinus rhythm Borderline prolonged PR interval Left  axis deviation Low voltage, precordial leads Consider anterior infarct  Borderline T abnormalities, inferior leads Confirmed by Beila Purdie  MD, Casia Corti  ([id number]) on 09/09/2015 2:22:10 PM      MDM   Final diagnoses:  None    Workup reveals a small, acute ischemic stroke at the level of the pons. This was discussed with Dr. Shon Hale from neurology who is recommending admission to the hospitalist service for further workup and treatment.    Veryl Speak, MD 09/09/15 [phone]

## 2015-09-09 NOTE — ED Notes (Signed)
Admitting NP at bedside

## 2015-09-09 NOTE — Progress Notes (Signed)
Pt arrived to unit at 15:21pm with no noted distress. Pt stable. Denies pain or discomfort at this time. Pt oriented to room. Safety measures in place. Call bell within reach. Wife at bedside. Will continue to monitor.

## 2015-09-09 NOTE — ED Notes (Signed)
PT's wife offered something to eat or drink. PT remains in MRI

## 2015-09-09 NOTE — Progress Notes (Signed)
Pt on telemetry monitoring.

## 2015-09-09 NOTE — ED Notes (Signed)
PT transported to 75M via stretcher by ED tech, Markus Daft

## 2015-09-09 NOTE — Progress Notes (Signed)
Attending stated that he would put new order for Brilinta.

## 2015-09-09 NOTE — ED Notes (Addendum)
Pt CBG, 99.

## 2015-09-09 NOTE — Progress Notes (Signed)
Pt requesting medication Brilinta he takes for his stent. Pt has no order on MAR. Notified Attending who instructed this nurse to clarify with Neurology if is ok for pt to be on Brilinta. Spoke with Neurology who stated that if Attending wants pt to be on Brilinta that it was ok. Attending paged pending new order.

## 2015-09-09 NOTE — ED Notes (Signed)
Pt reports intermittent numbness to bilateral arms x1 week. Also states difficulty walking and speaking on Wednesday. Upon arrival pt is alert and oriented x4. Slurred speech noted upon arrival.

## 2015-09-10 ENCOUNTER — Inpatient Hospital Stay (HOSPITAL_COMMUNITY): Payer: Medicare Other

## 2015-09-10 DIAGNOSIS — I639 Cerebral infarction, unspecified: Secondary | ICD-10-CM

## 2015-09-10 DIAGNOSIS — E785 Hyperlipidemia, unspecified: Secondary | ICD-10-CM

## 2015-09-10 DIAGNOSIS — I6789 Other cerebrovascular disease: Secondary | ICD-10-CM

## 2015-09-10 DIAGNOSIS — I6302 Cerebral infarction due to thrombosis of basilar artery: Secondary | ICD-10-CM

## 2015-09-10 LAB — HEMOGLOBIN A1C
HEMOGLOBIN A1C: 5.7 % — AB (ref 4.8–5.6)
MEAN PLASMA GLUCOSE: 117 mg/dL

## 2015-09-10 LAB — ECHOCARDIOGRAM COMPLETE
Height: 72 in
WEIGHTICAEL: 3168 [oz_av]

## 2015-09-10 MED ORDER — ROSUVASTATIN CALCIUM 5 MG PO TABS: 20.0000 mg | ORAL_TABLET | ORAL | Status: DC

## 2015-09-10 MED ORDER — ASPIRIN EC 325 MG PO TBEC: 325.0000 mg | DELAYED_RELEASE_TABLET | Freq: Every day | ORAL | Status: DC

## 2015-09-10 MED ORDER — PRAVASTATIN SODIUM 20 MG PO TABS
20.0000 mg | ORAL_TABLET | Freq: Every day | ORAL | Status: DC
  Administered 2015-09-10: 20 mg via ORAL
  Filled 2015-09-10: qty 1

## 2015-09-10 MED ORDER — ASPIRIN EC 81 MG PO TBEC
81.0000 mg | DELAYED_RELEASE_TABLET | Freq: Every day | ORAL | Status: DC
  Administered 2015-09-10 – 2015-09-11 (×2): 81 mg via ORAL
  Filled 2015-09-10 (×2): qty 1

## 2015-09-10 NOTE — Progress Notes (Signed)
Physical Therapy Evaluation & Discharge Patient Details Name: Peter [name] MRN: [id number] DOB: 30-Nov-1933 Today's Date: 09/10/2015   History of Present Illness  82 yo male with pontine level CVA, symptoms of slurred speech and difficulty walking in straight line noted on 7/5, admitted to ED on 7/7.  Also noted B arm numbness seeming related to C-spine arthritis (per patient).  Clinical Impression  Patient presents with questions about pending tests and timing of overall stay in hospital.  Agreeable to therapy evaluation.  Patient's balance is minimally decreased, not far off of baseline function, able to ambulate without device, without fatigue including flight of stairs.  Only noted gait deficits are mildly decreased pace and minimal path deviation (improved with practice).  Berg Balance Test 48/56 indicative of mild balance, likely mostly due to arthritic pain limitations.   Patient symptoms of unsteady gait appear to be resolving, or are resolved already.  May benefit from Outpatient PT services to address C-spine, L-spine and knee arthritic pain and symptoms complaint if desired.  Functionally, patient cleared from PT perspective, and will be discharged from PT roster.      Follow Up Recommendations Outpatient PT (To address arthritic pain and symptoms (if desired))    Equipment Recommendations  None recommended by PT    Recommendations for Other Services       Precautions / Restrictions Precautions Precautions: Fall Precaution Comments: Mild path deviation, no assistive device.  Recommend walk with partner as precaution. Restrictions Weight Bearing Restrictions: No      Mobility  Bed Mobility Overal bed mobility: Independent                Transfers Overall transfer level: Independent Equipment used: None                Ambulation/Gait Ambulation/Gait assistance: Supervision Ambulation Distance (Feet): 200 Feet Assistive device: None Gait  Pattern/deviations: Drifts right/left;WFL(Within Functional Limits)        Stairs Stairs: Yes Stairs assistance: Supervision Stair Management: One rail Left;Two rails;Alternating pattern Number of Stairs: 10 General stair comments: Can use either or both rails  Wheelchair Mobility    Modified Rankin (Stroke Patients Only) Modified Rankin (Stroke Patients Only) Pre-Morbid Rankin Score: No symptoms Modified Rankin: No significant disability     Balance Overall balance assessment: Independent                               Standardized Balance Assessment Standardized Balance Assessment : Berg Balance Test Berg Balance Test Sit to Stand: Able to stand without using hands and stabilize independently Standing Unsupported: Able to stand safely 2 minutes Sitting with Back Unsupported but Feet Supported on Floor or Stool: Able to sit safely and securely 2 minutes Stand to Sit: Sits safely with minimal use of hands Transfers: Able to transfer safely, minor use of hands Standing Unsupported with Eyes Closed: Able to stand 10 seconds safely Standing Ubsupported with Feet Together: Able to place feet together independently and stand for 1 minute with supervision From Standing, Reach Forward with Outstretched Arm: Can reach confidently >25 cm (10") From Standing Position, Pick up Object from Floor: Able to pick up shoe safely and easily From Standing Position, Turn to Look Behind Over each Shoulder: Looks behind one side only/other side shows less weight shift Turn 360 Degrees: Able to turn 360 degrees safely but slowly Standing Unsupported, Alternately Place Feet on Step/Stool: Able to stand independently and complete 8  steps >20 seconds Standing Unsupported, One Foot in Front: Able to plae foot ahead of the other independently and hold 30 seconds Standing on One Leg: Able to lift leg independently and hold equal to or more than 3 seconds Total Score: 48          Pertinent Vitals/Pain Pain Assessment: 0-10 Pain Score: 3  Pain Location: Knees, back, neck from arthritis. Pain Descriptors / Indicators: Aching;Sore Pain Intervention(s): Limited activity within patient's tolerance;Monitored during session    Lucas expects to be discharged to:: Private residence Living Arrangements: Spouse/significant other Available Help at Discharge: Family Type of Home: House Home Access: Level entry     Home Layout: Two level        Prior Function Level of Independence: Independent         Comments: Plays golf     Hand Dominance        Extremity/Trunk Assessment   Upper Extremity Assessment: Defer to OT evaluation;Overall WFL for tasks assessed           Lower Extremity Assessment: Overall WFL for tasks assessed      Cervical / Trunk Assessment: Normal  Communication   Communication: No difficulties  Cognition Arousal/Alertness: Awake/alert Behavior During Therapy: WFL for tasks assessed/performed Overall Cognitive Status: Within Functional Limits for tasks assessed                      General Comments General comments (skin integrity, edema, etc.): Arthritis pain generally    Exercises        Assessment/Plan    PT Assessment All further PT needs can be met in the next venue of care  PT Diagnosis Abnormality of gait   PT Problem List Decreased balance;Pain  PT Treatment Interventions     PT Goals (Current goals can be found in the Care Plan section) Acute Rehab PT Goals Patient Stated Goal: To get things moving. PT Goal Formulation: With patient    Frequency     Barriers to discharge        Co-evaluation               End of Session Equipment Utilized During Treatment: Gait belt Activity Tolerance: Patient tolerated treatment well Patient left: in bed;with call bell/phone within reach Nurse Communication: Mobility status         Time: 0910-0950 PT Time  Calculation (min) (ACUTE ONLY): 40 min   Charges:   PT Evaluation $PT Eval Low Complexity: 1 Procedure PT Treatments $Gait Training: 8-22 mins $Neuromuscular Re-education: 8-22 mins   PT G Codes:        Herb Beltre L 2015/09/23, 10:12 AM

## 2015-09-10 NOTE — Progress Notes (Signed)
STROKE TEAM PROGRESS NOTE   HISTORY OF PRESENT ILLNESS (per record) This is a 82 year old right-handed man who presents to the [facility] emergency Department complaining of slurred speech and an unsteady gait. History is obtained from the patient and his wife was present for the encounter. His wife reports that his symptoms began on the evening of 09/07/15. He noted that he was having some slurred speech. This occurred on an intermittent basis over the past couple of days. Yesterday evening he noticed that he is having some difficulty walking in a straight line as he was walking across the room. He did not appreciate any weakness, vision changes, difficulty swallowing, or coordination problems.   He did have some numbness in both of his arms, but this developed after he started using a new backrest which he obtained from his chiropractor. He states that the numbness developed while he was sitting in his chair with this backrest behind him. When he removed the backrest, the numbness went away fairly quickly. He states that he has been seeing a chiropractor because of severe pain from his arthritis and from chronic back problems. The treatment from his chiropractor has consisted of some adjustments to his lumbar spine and some massage in the shoulders and thoracic region. He states that he has not had any adjustments of his neck.  He presented to the emergency department today for further evaluation. An MRI scan of the brain was obtained and reportedly showed an acute infarction in the pons. He has been admitted for stroke evaluation.    SUBJECTIVE (INTERVAL HISTORY) His RN is at the bedside.  Overall he feels his condition is stable. He stated that he had MI 3 months ago s/p stent placement. He follows with Dr. Irish Lack and put on ASA and brilinta as well as crestor. He did not tolerate with crestor and currently on once a week dose. He if off metoprolol and still on valsartan for BP control. He was told  not to take ASA more than 100mg  along with brilinta.     OBJECTIVE Temp:  [97.7 F (36.5 C)-98.8 F (37.1 C)] 97.7 F (36.5 C) (07/08 0330) Pulse Rate:  [51-64] 58 (07/08 0330) Cardiac Rhythm:  [-] Heart block (07/07 1900) Resp:  [14-24] 18 (07/08 0330) BP: (98-175)/(57-95) 119/74 mmHg (07/08 0330) SpO2:  [94 %-99 %] 97 % (07/08 0330) Weight:  [89.812 kg (198 lb)] 89.812 kg (198 lb) (07/07 1044)  CBC:  Recent Labs Lab 09/09/15 1122 09/09/15 1136  WBC 5.6  --   NEUTROABS 3.9  --   HGB 13.6 13.9  HCT 41.1 41.0  MCV 94.1  --   PLT 239  --     Basic Metabolic Panel:  Recent Labs Lab 09/09/15 1122 09/09/15 1136  NA 140 143  K 3.5 3.5  CL 112* 108  CO2 23  --   GLUCOSE 100* 102*  BUN 11 13  CREATININE 0.79 0.80  CALCIUM 8.8*  --     Lipid Panel:    Component Value Date/Time   CHOL 158 09/09/2015 1122   TRIG 100 09/09/2015 1122   HDL 33* 09/09/2015 1122   CHOLHDL 4.8 09/09/2015 1122   VLDL 20 09/09/2015 1122   LDLCALC 105* 09/09/2015 1122   HgbA1c:  Lab Results  Component Value Date   HGBA1C 5.7* 09/09/2015   Urine Drug Screen: No results found for: LABOPIA, COCAINSCRNUR, LABBENZ, AMPHETMU, THCU, LABBARB    IMAGING I have personally reviewed the radiological images below and agree  with the radiology interpretations.  Dg Chest 2 View 09/10/2015   No acute abnormalities.   Mr Brain Wo Contrast 09/09/2015   1. Small acute infarct in the left paracentral pons (basilar artery perforator branch territory).  No associated hemorrhage or mass effect.  2. Otherwise normal for age noncontrast MRI appearance of the brain.   Mr Jodene Nam Head/brain Wo Cm 09/09/2015   1. No major branch occlusion or flow limiting stenosis.  2. Mild right MCA narrowing at the bifurcation.   CUS pending  TTE pending   PHYSICAL EXAM  Temp:  [97.7 F (36.5 C)-98.1 F (36.7 C)] 98.1 F (36.7 C) (07/08 1002) Pulse Rate:  [51-64] 64 (07/08 1002) Resp:  [14-22] 22 (07/08 1002) BP:  (98-175)/(57-95) 134/60 mmHg (07/08 1002) SpO2:  [94 %-99 %] 96 % (07/08 1002)  General - Well nourished, well developed, in no apparent distress.  Ophthalmologic - Sharp disc margins OU.   Cardiovascular - Regular rate and rhythm.  Mental Status -  Level of arousal and orientation to time, place, and person were intact. Language including expression, naming, repetition, comprehension was assessed and found intact. Attention span and concentration were normal. Recent and remote memory were intact. Fund of Knowledge was assessed and was intact.  Cranial Nerves II - XII - II - Visual field intact OU. III, IV, VI - Extraocular movements intact. V - Facial sensation intact bilaterally. VII - Facial movement intact bilaterally. VIII - Hearing & vestibular intact bilaterally. X - Palate elevates symmetrically. XI - Chin turning & shoulder shrug intact bilaterally. XII - Tongue protrusion intact.  Motor Strength - The patient's strength was normal in all extremities except left knee flexion 4/5 due to left knee pain and pronator drift was absent.  Bulk was normal and fasciculations were absent.   Motor Tone - Muscle tone was assessed at the neck and appendages and was normal.  Reflexes - The patient's reflexes were 1+ in all extremities and he had no pathological reflexes.  Sensory - Light touch, temperature/pinprick were assessed and were symmetrical.    Coordination - The patient had normal movements in the hands and feet with no ataxia or dysmetria.  Tremor was absent.  Gait and Station - The patient's transfers, posture, gait, station, and turns were observed as normal.   ASSESSMENT/PLAN Mr. DONELL JALOMO II is a 82 y.o. male with history of hypertension, chronic back pain, hyperlipidemia, CAD and MI s/p stent in 05/2015 presenting with slurred speech and unsteady gait. He did not receive IV t-PA due to late presentation.   Stroke:  Left pontine infarct secondary to small  vessel disease.  Resultant  Deficit resolved  MRI - Small acute infarct in the left paracentral pons   MRA - No major branch occlusion or flow limiting stenosis.   Carotid Doppler pending  2D Echo pending  LDL 105  HgbA1c 5.7  VTE prophylaxis - Lovenox  Diet heart healthy/carb modified Room service appropriate?: Yes; Fluid consistency:: Thin  aspirin 81 mg daily and brilinta bid prior to admission, now on aspirin 81mg  daily and brilinta bid. However, due to uncertain benefit of brilinta for stroke prevention, we recommend to change brilinta to plavix for stroke and cardiac prevention along with ASA 81. Pt would like Dr. Irish Lack to be on the same page. Will try to contact Dr. Irish Lack today. Continue ASA and brilinta for now.  Patient counseled to be compliant with his antithrombotic medications  Ongoing aggressive stroke risk factor management  Therapy  recommendations: outpt PT   Disposition: Pending  CAD / MI s/p stent 05/2015  On ASA and brilinta   Follows with Dr. Irish Lack as outpt  For both cardiac and stroke prevention, we recommend to change brilinta to plavix. Will try to contact Dr. Beau Fanny today to see if this is OK. In case not able to reach Dr. Irish Lack, will change brilinta to plavix tomorrow and we will send this progress note to Dr. Irish Lack. Pt is to call Dr. Irish Lack next Monday for further discussion.  Hypertension  Blood pressure tends to run low  Permissive hypertension (OK if < 220/120) but gradually normalize in 5-7 days  Home valsartan not resumed  Long-term BP goal normotensive  Hyperlipidemia  Home meds:  Crestor 10 mg weekly at home due to side effect  LDL 105, goal < 70  Pt agrees to try pravastatin daily   Continue statin at discharge  Other Stroke Risk Factors  Advanced age  Cigarette smoker - quit in 1974.  ETOH use - drinks one can of beer per week  Family hx stroke (mother)  Other Active Problems  Chronic back  pain  Left knee pain  Bilateral arm numbness prior to admission felt secondary to cervical spine disease.  Hospital day # 1  Rosalin Hawking, MD PhD Stroke Neurology 09/10/2015 12:00 PM      To contact Stroke Continuity provider, please refer to [email or web address] After hours, contact General Neurology

## 2015-09-10 NOTE — Progress Notes (Signed)
  Echocardiogram 2D Echocardiogram has been performed.  Peter [name] 09/10/2015, 1:13 PM

## 2015-09-10 NOTE — Progress Notes (Signed)
PROGRESS NOTE  Peter VALLES [name] [id number] DOB: [birth year] DOA: 09/09/2015 PCP: Chesley Noon, MD  Brief History:  82 year old male with history of hypertension, coronary artery disease status post STEMI in March 2017 for which he underwent DES to the posterior lateral artery, anxiety, hyperlipidemia presented with dysarthria and unsteady gait that began while he was at his primary care provider's office on 09/08/2015.  After presentation to the emergency department, MRI of the brain showed small acute infarct in the left pons. Neurology was consulted, and full stroke workup was undertaken.  Previous medical records reviewed and summarized  Assessment/Plan: Acute nonhemorrhagic stroke Left Pons --Neurology Consult appreciated -09/10/15--case discussed with Dr. Lenna Sciara Xu-->ASA 81 mg + plavix -PT/OT evaluation--outpt PT if desired -Speech therapy eval -MRI-small acute infarct left pons -MRA brain--mild right MCA narrowing -Carotid Duplex--pending -Echo--pending -LDL--105 -HbA1C--5.7 -Long discussion with the patient about statins--he was only taking Crestor once per week even after his most recent visit with his cardiologist, Dr. Irish Lack -pt agrees to take Crestor daily  Hypertension -allow for permissive hypertension -Hold ARB -am BMP  Coronary artery disease with history of STEMI -Case was discussed with Dr. Lenna Sciara. Xu-->plan to switch to ASA + plavix -No chest pain presently -personally reviewed EKG--sinus, nonspecific T wave changes -Continue Crestor daily  Hyperlipidemia -restart  Crestor  Anxiety -Continue alprazolam at bedtime  GERD -Continue PPI   Disposition Plan:   Home in 7/9 if ok with neurology Family Communication:  No Family at bedside--Total time spent 35 minutes.  Greater than 50% spent face to face counseling and coordinating care.  Consultants:  Neurology  Code Status:  FULL   DVT Prophylaxis:  [place] Lovenox   Procedures: As Listed in  Progress Note Above  Antibiotics: None    Subjective: Patient denies fevers, chills, headache, chest pain, dyspnea, nausea, vomiting, diarrhea, abdominal pain, dysuria, hematuria.     Objective: Filed Vitals:   09/09/15 2330 09/10/15 0130 09/10/15 0330 09/10/15 1002  BP: 100/57 98/64 119/74 134/60  Pulse: 64 58 58 64  Temp: 97.9 F (36.6 C) 97.9 F (36.6 C) 97.7 F (36.5 C) 98.1 F (36.7 C)  TempSrc: Oral Oral Oral Oral  Resp: 18 18 18 22   Height:      Weight:      SpO2: 94% 96% 97% 96%    Intake/Output Summary (Last 24 hours) at 09/10/15 1141 Last data filed at 09/10/15 1010  Gross per 24 hour  Intake    480 ml  Output      0 ml  Net    480 ml   Weight change:  Exam:   General:  Pt is alert, follows commands appropriately, not in acute distress  HEENT: No icterus, No thrush, No neck mass, [place]/AT  Cardiovascular: RRR, S1/S2, no rubs, no gallops  Respiratory: CTA bilaterally, no wheezing, no crackles, no rhonchi  Abdomen: Soft/+BS, non tender, non distended, no guarding  Extremities: No edema, No lymphangitis, No petechiae, No rashes, no synovitis   Data Reviewed: I have personally reviewed following labs and imaging studies Basic Metabolic Panel:  Recent Labs Lab 09/09/15 1122 09/09/15 1136  NA 140 143  K 3.5 3.5  CL 112* 108  CO2 23  --   GLUCOSE 100* 102*  BUN 11 13  CREATININE 0.79 0.80  CALCIUM 8.8*  --    Liver Function Tests:  Recent Labs Lab 09/09/15 1122  AST 21  ALT 22  ALKPHOS 45  BILITOT 1.3*  PROT 6.5  ALBUMIN 3.5   No results for input(s): LIPASE, AMYLASE in the last 168 hours. No results for input(s): AMMONIA in the last 168 hours. Coagulation Profile:  Recent Labs Lab 09/09/15 1122  INR 1.12   CBC:  Recent Labs Lab 09/09/15 1122 09/09/15 1136  WBC 5.6  --   NEUTROABS 3.9  --   HGB 13.6 13.9  HCT 41.1 41.0  MCV 94.1  --   PLT 239  --    Cardiac Enzymes: No results for input(s): CKTOTAL, CKMB,  CKMBINDEX, TROPONINI in the last 168 hours. BNP: Invalid input(s): POCBNP CBG:  Recent Labs Lab 09/09/15 1235  GLUCAP 99   HbA1C:  Recent Labs  09/09/15 1122  HGBA1C 5.7*   Urine analysis:    Component Value Date/Time   COLORURINE YELLOW 04/21/2014 1906   APPEARANCEUR CLEAR 04/21/2014 1906   LABSPEC 1.019 04/21/2014 1906   PHURINE 5.5 04/21/2014 1906   GLUCOSEU NEGATIVE 04/21/2014 1906   HGBUR NEGATIVE 04/21/2014 1906   BILIRUBINUR NEGATIVE 04/21/2014 1906   BILIRUBINUR neg 03/22/2012 1844   KETONESUR NEGATIVE 04/21/2014 1906   PROTEINUR NEGATIVE 04/21/2014 1906   PROTEINUR neg 03/22/2012 1844   UROBILINOGEN 1.0 04/21/2014 1906   UROBILINOGEN 0.2 03/22/2012 1844   NITRITE NEGATIVE 04/21/2014 1906   NITRITE neg 03/22/2012 1844   LEUKOCYTESUR NEGATIVE 04/21/2014 1906   Sepsis Labs: @LABRCNTIP (procalcitonin:4,lacticidven:4) )No results found for this or any previous visit (from the past 240 hour(s)).   Scheduled Meds: . [START ON 09/11/2015] aspirin EC  81 mg Oral Daily  . enoxaparin (LOVENOX) injection  40 mg Subcutaneous Q24H  . pantoprazole  40 mg Oral Daily  . [START ON 09/12/2015] rosuvastatin  20 mg Oral Weekly  . ticagrelor  90 mg Oral BID   Continuous Infusions:   Procedures/Studies: Dg Chest 2 View  09/10/2015  CLINICAL DATA:  Stroke, history hypertension, coronary artery disease, former smoker EXAM: CHEST  2 VIEW COMPARISON:  04/21/2014 FINDINGS: Normal heart size, mediastinal contours, and pulmonary vascularity. Atherosclerotic calcification aorta. Chronic elevation RIGHT diaphragm. Lungs otherwise clear. No infiltrate, pleural effusion or pneumothorax. Probable BILATERAL nipple shadows, seen on lateral view as well. No acute osseous findings. IMPRESSION: No acute abnormalities. Electronically Signed   By: Lavonia Dana M.D.   On: 09/10/2015 07:49   Mr Brain Wo Contrast  09/09/2015  CLINICAL DATA:  82 year old male with slurred speech beginning 2 days ago.  Bilateral arm numbness for 1 week. Initial encounter. EXAM: MRI HEAD WITHOUT CONTRAST TECHNIQUE: Multiplanar, multiecho pulse sequences of the brain and surrounding structures were obtained without intravenous contrast. COMPARISON:  Brain MRI 11/17/2012. FINDINGS: Major intracranial vascular flow voids are stable. 11 mm area of confluent restricted diffusion in the left paracentral pons (series 3, image 11). Mild associated T2 and FLAIR hyperintensity with no associated hemorrhage or mass effect. No other restricted diffusion. No midline shift, mass effect, evidence of mass lesion, ventriculomegaly, extra-axial collection or acute intracranial hemorrhage. Cervicomedullary junction and pituitary are within normal limits. Negative visualized cervical spine. Outside of the acute findings gray and white matter signal throughout the brain is stable and normal for age. Visible internal auditory structures appear normal. Visualized paranasal sinuses and mastoids are stable and well pneumatized. Negative orbit and scalp soft tissues. Normal bone marrow signal. IMPRESSION: 1. Small acute infarct in the left paracentral pons (basilar artery perforator branch territory). No associated hemorrhage or mass effect. 2. Otherwise normal for age noncontrast MRI appearance of the brain. Electronically Signed  By: Genevie Ann M.D.   On: 09/09/2015 13:56   Mr Jodene Nam Head/brain Wo Cm  09/09/2015  CLINICAL DATA:  Acute pontine infarct on MRI. Bilateral arm numbness, slurred speech, and unsteady gait. EXAM: MRA HEAD WITHOUT CONTRAST TECHNIQUE: Angiographic images of the Circle of Willis were obtained using MRA technique without intravenous contrast. COMPARISON:  None. FINDINGS: The visualized distal vertebral arteries are widely patent and codominant. The right PICA is patent. The left PICA is small and suboptimally evaluated but grossly patent proximally. AICAs are not clearly identified. SCA origins are patent. Basilar artery is widely  patent. Posterior communicating arteries are not identified. PCAs are patent without evidence of major branch occlusion or significant proximal stenosis. The internal carotid arteries are patent from skullbase to carotid termini without stenosis. ACAs and MCAs are patent without evidence of major branch occlusion or flow limiting stenosis. There is mild right MCA narrowing at the level of the bifurcation. IMPRESSION: 1. No major branch occlusion or flow limiting stenosis. 2. Mild right MCA narrowing at the bifurcation. Electronically Signed   By: Logan Bores M.D.   On: 09/09/2015 16:52    Mayreli Alden, DO  [facility] Pager [phone]  If 7PM-7AM, please contact night-coverage [email or web address] Password [email or web address] 09/10/2015, 11:41 AM   LOS: 1 day

## 2015-09-10 NOTE — Progress Notes (Signed)
Occupational Therapy Evaluation/Discharge Patient Details Name: Peter [name] MRN: [id number] DOB: [birth year] Today's Date: 09/10/2015    History of Present Illness 82 yo male with pontine level CVA, symptoms of slurred speech and difficulty walking in straight line noted on 7/5, admitted to ED on 7/7.  Also noted B arm numbness seeming related to C-spine arthritis (per patient).   Clinical Impression   Patient has been evaluated by Occupational Therapy with no acute OT needs identified. Pt able to complete all basic ADLs and transfers with modified independence. Pt noted to have nystagmus with horizontal and vertical tracking and R eye having difficulty tracking medially, but pt reported no visual changes. Encouraged pt to follow up with current eye doctor just as a precaution and educated pt on signs and symptoms of a stroke. All education has been completed and pt has no further questions. Pt with no further acute OT needs. OT signing off. Thank you for this referral.     Follow Up Recommendations  No OT follow up    Equipment Recommendations  None recommended by OT    Recommendations for Other Services       Precautions / Restrictions Precautions Precautions: None Restrictions Weight Bearing Restrictions: No      Mobility Bed Mobility Overal bed mobility: Independent                Transfers Overall transfer level: Modified independent Equipment used: None             General transfer comment: No LOB noted or dizziness reported    Balance Overall balance assessment: No apparent balance deficits (not formally assessed)                                          ADL Overall ADL's : Modified independent                                             Vision Vision Assessment?: Yes Eye Alignment: Within Functional Limits Ocular Range of Motion: Within Functional Limits Alignment/Gaze Preference: Within Defined  Limits Tracking/Visual Pursuits: Able to track stimulus in all quads without difficulty (horizontal and vertical nystagmus noted) Saccades: Within functional limits Convergence: Within functional limits Visual Fields: No apparent deficits   Agricultural engineer Tested?: Yes Perception Deficits: Inattention/neglect Inattention/Neglect: Appears intact   Praxis Praxis Praxis tested?: Within functional limits    Pertinent Vitals/Pain Pain Assessment: 0-10 Pain Score: 3  Pain Location: low back, neck, knees from arthritis Pain Descriptors / Indicators: Aching Pain Intervention(s): Monitored during session;Repositioned     Hand Dominance Right   Extremity/Trunk Assessment Upper Extremity Assessment Upper Extremity Assessment: Overall WFL for tasks assessed   Lower Extremity Assessment Lower Extremity Assessment: Overall WFL for tasks assessed   Cervical / Trunk Assessment Cervical / Trunk Assessment: Normal   Communication Communication Communication: No difficulties   Cognition Arousal/Alertness: Awake/alert Behavior During Therapy: WFL for tasks assessed/performed Overall Cognitive Status: Within Functional Limits for tasks assessed                     General Comments       Exercises       Shoulder Instructions      Home Living Family/patient expects to be discharged to::  Private residence Living Arrangements: Spouse/significant other;Children Available Help at Discharge: Family;Available 24 hours/day Type of Home: House Home Access: Stairs to enter [employer] of Steps: 4 Entrance Stairs-Rails: Right;Left Home Layout: Two level Alternate Level Stairs-Number of Steps: 13 Alternate Level Stairs-Rails: Right;Left Bathroom Shower/Tub: Walk-in shower;Door   Bathroom Toilet: Handicapped height     Home Equipment: Environmental consultant - 2 wheels;Cane - single point;Hand held shower head          Prior Functioning/Environment Level of  Independence: Independent        Comments: Drives, plays golf    OT Diagnosis: Acute pain   OT Problem List: Impaired balance (sitting and/or standing);Decreased safety awareness;Pain   OT Treatment/Interventions:      OT Goals(Current goals can be found in the care plan section) Acute Rehab OT Goals Patient Stated Goal: to go home OT Goal Formulation: With patient Time For Goal Achievement: 09/24/15 Potential to Achieve Goals: Good  OT Frequency:     Barriers to D/C:            Co-evaluation              End of Session Nurse Communication: Mobility status  Activity Tolerance: Patient tolerated treatment well Patient left: in bed;with call bell/phone within reach   Time: 1432-1449 OT Time Calculation (min): 17 min Charges:  OT General Charges $OT Visit: 1 Procedure OT Evaluation $OT Eval Low Complexity: 1 Procedure G-Codes:    Redmond Baseman, OTR/L PagerUD:[phone] 09/10/2015, 3:08 PM

## 2015-09-10 NOTE — Progress Notes (Signed)
VASCULAR LAB PRELIMINARY  PRELIMINARY  PRELIMINARY  PRELIMINARY  Carotid duplex completed.    Preliminary report:  1-39% ICA plaquing.  Vertebral artery flow is antegrade.   Normal Recinos, RVT 09/10/2015, 12:32 PM

## 2015-09-10 NOTE — Evaluation (Signed)
Clinical/Bedside Swallow Evaluation Patient Details  Name: Peter [name] MRN: [id number] Date of Birth: 1934/02/15  Today's Date: 09/10/2015 Time: SLP Start Time (ACUTE ONLY): 78 SLP Stop Time (ACUTE ONLY): 1524 SLP Time Calculation (min) (ACUTE ONLY): 22 min  Past Medical History:  Past Medical History  Diagnosis Date  . Arthritis   . Cataract   . Prostate disease   . Skin cancer   . Hypertension   . Shoulder pain   . Reflux   . Chronic back pain   . Loss of balance   . HLD (hyperlipidemia)   . Anxiety   . Sepsis [facility]) February 2016    Pt stated he was treated at [facility] for this   . Prostate cancer screening     "for several years"  . CAD (coronary artery disease)     a. 05/2015: STEMI: 99% stenosis of Posterolateral w/ Synergy DES placed.  Marland Kitchen History of nuclear stress test     ETT-Myoview 5/17: EF 54%, inf scar, no ischemia; Low Risk   Past Surgical History:  Past Surgical History  Procedure Laterality Date  . Vasectomy    . Appendectomy    . Colon surgery    . Hand surgery Left   . Cardiac catheterization N/A 05/31/2015    Procedure: Left Heart Cath and Coronary Angiography;  Surgeon: Jettie Booze, MD;  Location: [place] [facility];  Service: Cardiovascular;  Laterality: N/A;  . Cardiac catheterization N/A 05/31/2015    Procedure: Coronary Stent Intervention;  Surgeon: Jettie Booze, MD;  Location: [place] [facility];  Service: Cardiovascular;  Laterality: N/A;   HPI:  82 y.o. male with medical history significant for hypertension, CAD status post stent earlier this year, hyperlipidemia, chronic back pain presents from PCP office with the chief complaint of slurred speech and unsteady gait. Initial evaluation in the ED includes an MRI revealing acute infarct.   Assessment / Plan / Recommendation Clinical Impression   Pt exhibits normal oropharyngeal swallow; mild anomia in complex conversation initially, but this has resolved; ST will s/o at this  time; Regular/thin diet recommended    Aspiration Risk  No limitations    Diet Recommendation   Regular/thin liquids  Medication Administration: Whole meds with liquid    Other  Recommendations Oral Care Recommendations: Oral care BID   Follow up Recommendations  None    Frequency and Duration   n/a         Prognosis Prognosis for Safe Diet Advancement: Good      Swallow Study   General Date of Onset: 09/09/15 HPI: 82 y.o. male with medical history significant for hypertension, CAD status post stent earlier this year, hyperlipidemia, chronic back pain presents from PCP office with the chief complaint of slurred speech and unsteady gait. Initial evaluation in the ED includes an MRI revealing acute infarct. Type of Study: Bedside Swallow Evaluation Diet Prior to this Study: Regular;Thin liquids Temperature Spikes Noted: No Respiratory Status: Room air History of Recent Intubation: No Behavior/Cognition: Alert;Cooperative Oral Cavity Assessment: Within Functional Limits Oral Care Completed by SLP: No Oral Cavity - Dentition: Adequate natural dentition Vision: Functional for self-feeding Self-Feeding Abilities: Able to feed self Patient Positioning: Upright in bed Baseline Vocal Quality: Normal Volitional Cough: Strong Volitional Swallow: Able to elicit    Oral/Motor/Sensory Function Overall Oral Motor/Sensory Function: Within functional limits   Ice Chips Ice chips: Not tested   Thin Liquid Thin Liquid: Within functional limits Presentation: Cup;Straw    Nectar  Thick Nectar Thick Liquid: Not tested   Honey Thick Honey Thick Liquid: Not tested   Puree Puree: Within functional limits Presentation: Spoon   Solid      Solid: Within functional limits Presentation: Self Fed    Functional Assessment Tool Used: NOMS Functional Limitations: Other Speech Language Pathology Other Speech-Language Pathology Functional Limitation [phone]): 0 percent impaired, limited or  restricted   ADAMS,PAT, M.S., CCC-SLP 09/10/2015,3:43 PM

## 2015-09-11 ENCOUNTER — Other Ambulatory Visit: Payer: Self-pay | Admitting: Neurology

## 2015-09-11 DIAGNOSIS — I2510 Atherosclerotic heart disease of native coronary artery without angina pectoris: Secondary | ICD-10-CM

## 2015-09-11 DIAGNOSIS — I639 Cerebral infarction, unspecified: Secondary | ICD-10-CM

## 2015-09-11 DIAGNOSIS — I6302 Cerebral infarction due to thrombosis of basilar artery: Secondary | ICD-10-CM

## 2015-09-11 LAB — BASIC METABOLIC PANEL
ANION GAP: 6 (ref 5–15)
BUN: 15 mg/dL (ref 6–20)
CO2: 23 mmol/L (ref 22–32)
Calcium: 8.8 mg/dL — ABNORMAL LOW (ref 8.9–10.3)
Chloride: 111 mmol/L (ref 101–111)
Creatinine, Ser: 0.92 mg/dL (ref 0.61–1.24)
GFR calc non Af Amer: >60 mL/min (ref >60)
GLUCOSE: 126 mg/dL — AB (ref 65–99)
POTASSIUM: 3.4 mmol/L — AB (ref 3.5–5.1)
Sodium: 140 mmol/L (ref 135–145)

## 2015-09-11 MED ORDER — POTASSIUM CHLORIDE CRYS ER 20 MEQ PO TBCR: 20.0000 meq | EXTENDED_RELEASE_TABLET | Freq: Once | ORAL | Status: DC

## 2015-09-11 MED ORDER — PRAVASTATIN SODIUM 40 MG PO TABS: 40.0000 mg | ORAL_TABLET | Freq: Every day | ORAL | Status: DC

## 2015-09-11 MED ORDER — VALSARTAN 40 MG PO TABS: 40.0000 mg | ORAL_TABLET | Freq: Every day | ORAL | Status: AC

## 2015-09-11 MED ORDER — CLOPIDOGREL BISULFATE 75 MG PO TABS: 75.0000 mg | ORAL_TABLET | Freq: Every day | ORAL | Status: DC

## 2015-09-11 NOTE — Progress Notes (Signed)
STROKE TEAM PROGRESS NOTE   SUBJECTIVE (INTERVAL HISTORY) No family is at the bedside. No acute event overnight. Pt is in agreement with switch from brilinta to plavix.    OBJECTIVE Temp:  [97.7 F (36.5 C)-98.3 F (36.8 C)] 98.2 F (36.8 C) (07/09 1300) Pulse Rate:  [56-61] 58 (07/09 1300) Cardiac Rhythm:  [-] Sinus bradycardia;Bundle branch block (07/09 0700) Resp:  [16-20] 18 (07/09 1300) BP: (112-149)/(72-77) 143/77 mmHg (07/09 1300) SpO2:  [95 %-97 %] 97 % (07/09 1300)  CBC:   Recent Labs Lab 09/09/15 1122 09/09/15 1136  WBC 5.6  --   NEUTROABS 3.9  --   HGB 13.6 13.9  HCT 41.1 41.0  MCV 94.1  --   PLT 239  --     Basic Metabolic Panel:   Recent Labs Lab 09/09/15 1122 09/09/15 1136 09/11/15 0821  NA 140 143 140  K 3.5 3.5 3.4*  CL 112* 108 111  CO2 23  --  23  GLUCOSE 100* 102* 126*  BUN 11 13 15   CREATININE 0.79 0.80 0.92  CALCIUM 8.8*  --  8.8*    Lipid Panel:     Component Value Date/Time   CHOL 158 09/09/2015 1122   TRIG 100 09/09/2015 1122   HDL 33* 09/09/2015 1122   CHOLHDL 4.8 09/09/2015 1122   VLDL 20 09/09/2015 1122   LDLCALC 105* 09/09/2015 1122   HgbA1c:  Lab Results  Component Value Date   HGBA1C 5.7* 09/09/2015   Urine Drug Screen: No results found for: LABOPIA, COCAINSCRNUR, LABBENZ, AMPHETMU, THCU, LABBARB    IMAGING I have personally reviewed the radiological images below and agree with the radiology interpretations.  Dg Chest 2 View 09/10/2015   No acute abnormalities.   Mr Brain Wo Contrast 09/09/2015   1. Small acute infarct in the left paracentral pons (basilar artery perforator branch territory).  No associated hemorrhage or mass effect.  2. Otherwise normal for age noncontrast MRI appearance of the brain.   Mr Jodene Nam Head/brain Wo Cm 09/09/2015   1. No major branch occlusion or flow limiting stenosis.  2. Mild right MCA narrowing at the bifurcation.   CUS Bilateral: 1-39% ICA stenosis. Vertebral artery flow is  antegrade.  TTE - Left ventricle: The cavity size was normal. Systolic function was  normal. The estimated ejection fraction was in the range of 55%  to 60%. Wall motion was normal; there were no regional wall  motion abnormalities. There was an increased relative  contribution of atrial contraction to ventricular filling.  Doppler parameters are consistent with abnormal left ventricular  relaxation (grade 1 diastolic dysfunction). - Aortic valve: There was mild regurgitation. - Mitral valve: There was trivial regurgitation.   PHYSICAL EXAM  Temp:  [97.7 F (36.5 C)-98.3 F (36.8 C)] 98.2 F (36.8 C) (07/09 1300) Pulse Rate:  [56-61] 58 (07/09 1300) Resp:  [16-20] 18 (07/09 1300) BP: (112-149)/(72-77) 143/77 mmHg (07/09 1300) SpO2:  [95 %-97 %] 97 % (07/09 1300)  General - Well nourished, well developed, in no apparent distress.  Ophthalmologic - Sharp disc margins OU.   Cardiovascular - Regular rate and rhythm.  Mental Status -  Level of arousal and orientation to time, place, and person were intact. Language including expression, naming, repetition, comprehension was assessed and found intact. Attention span and concentration were normal. Recent and remote memory were intact. Fund of Knowledge was assessed and was intact.  Cranial Nerves Peter [name] - XII - Peter [name] - Visual field intact OU. III, IV, VI -  Extraocular movements intact. V - Facial sensation intact bilaterally. VII - Facial movement intact bilaterally. VIII - Hearing & vestibular intact bilaterally. X - Palate elevates symmetrically. XI - Chin turning & shoulder shrug intact bilaterally. XII - Tongue protrusion intact.  Motor Strength - The patient's strength was normal in all extremities except left knee flexion 4/5 due to left knee pain and pronator drift was absent.  Bulk was normal and fasciculations were absent.   Motor Tone - Muscle tone was assessed at the neck and appendages and was normal.  Reflexes -  The patient's reflexes were 1+ in all extremities and he had no pathological reflexes.  Sensory - Light touch, temperature/pinprick were assessed and were symmetrical.    Coordination - The patient had normal movements in the hands and feet with no ataxia or dysmetria.  Tremor was absent.  Gait and Station - The patient's transfers, posture, gait, station, and turns were observed as normal.   ASSESSMENT/PLAN Peter Peter [name] is a 82 y.o. male with history of hypertension, chronic back pain, hyperlipidemia, CAD and MI s/p stent in 05/2015 presenting with slurred speech and unsteady gait. He did not receive IV t-PA due to late presentation.   Stroke:  Left pontine infarct secondary to small vessel disease.  Resultant  Deficit resolved  MRI - Small acute infarct in the left paracentral pons   MRA - No major branch occlusion or flow limiting stenosis.   Carotid Doppler unremarkable  2D Echo EF 55-60%  LDL 105  HgbA1c 5.7  VTE prophylaxis - Lovenox Diet - low sodium heart healthy  aspirin 81 mg daily and brilinta bid prior to admission, now on aspirin 81mg  daily and brilinta bid. However, due to uncertain benefit of brilinta for stroke prevention, we recommend to change brilinta to plavix for stroke and cardiac prevention along with ASA 81. Will sent note to Dr. Irish Lack.   Patient counseled to be compliant with his antithrombotic medications  Ongoing aggressive stroke risk factor management  Therapy recommendations: outpt PT   Disposition: Pending  Pt is STROKE AF candidate and he is willing to participate. Will Financial trader for enrollment.  CAD / MI s/p stent 05/2015  On ASA and brilinta   Follows with Dr. Irish Lack as outpt  For both cardiac and stroke prevention, we recommend to change brilinta to plavix.   Hypertension  Blood pressure tends to run low  Permissive hypertension (OK if < 220/120) but gradually normalize in 5-7 days  Home  valsartan not resumed  Long-term BP goal normotensive  Hyperlipidemia  Home meds:  Crestor 10 mg weekly at home due to side effect  LDL 105, goal < 70  Pt agrees to try pravastatin daily   Continue statin at discharge  Other Stroke Risk Factors  Advanced age  Cigarette smoker - quit in 1974.  ETOH use - drinks one can of beer per week  Family hx stroke (mother)  Other Active Problems  Chronic back pain  Left knee pain  Bilateral arm numbness prior to admission felt secondary to cervical spine disease.  Hospital day # 2  Neurology will sign off. Please call with questions. Pt will follow up with carolyn Hassell Done NP at [facility] in about 2 months. Thanks for the consult.   Rosalin Hawking, MD PhD Stroke Neurology 09/11/2015 10:25 PM      To contact Stroke Continuity provider, please refer to [email or web address] After hours, contact General Neurology

## 2015-09-11 NOTE — Progress Notes (Signed)
Discharge instructions is reviewed with patient and spouse.

## 2015-09-11 NOTE — Discharge Summary (Addendum)
Physician Discharge Summary  Peter [name] [id number] DOB: 09-05-1933 DOA: 09/09/2015  PCP: Chesley Noon, MD  Admit date: 09/09/2015 Discharge date: 09/11/2015  Admitted From: Home Disposition:  Home  Recommendations for Outpatient Follow-up:  1. Follow up with PCP in 1-2 weeks 2. Please obtain BMP/CBC in one week   Home Health: No Equipment/Devices: none  Discharge Condition:stable CODE STATUS: FULL Diet recommendation: Heart Healthy  Brief/Interim Summary: 82 year old male with history of hypertension, coronary artery disease status post STEMI in March 2017 for which he underwent DES to the posterior lateral artery, anxiety, hyperlipidemia presented with dysarthria and unsteady gait that began while he was at his primary care provider's office on 09/08/2015. After presentation to the emergency department, MRI of the brain showed small acute infarct in the left pons. Neurology was consulted, and full stroke workup was undertaken. Previous medical records reviewed and summarized  Discharge Diagnoses:  Acute nonhemorrhagic stroke Left Pons --Neurology Consult appreciated -09/10/15--case discussed with Dr. Lenna Sciara Xu-->ASA 81 mg + plavix -PT/OT evaluation--outpt PT if desired -Speech therapy eval -MRI-small acute infarct left pons -MRA brain--mild right MCA narrowing -Carotid Duplex--negative -Echo--EF 55-60, grade 1 DD, no emboli source -LDL--105 -HbA1C--5.7 -Long discussion with the patient about statins--he was only taking Crestor once per week even after his most recent visit with his cardiologist, Dr. Irish Lack -pt agrees to take statin daily but he wanted a lower potency statin due to his concern for myalgias--home with pravastatin 40 mg daily  Hypertension -allow for permissive hypertension -Hold ARB--restart valsartan 40 mg on 09/13/15   Coronary artery disease with history of STEMI -Case was discussed with Dr. Lenna Sciara. Xu-->plan to switch to ASA + plavix -No chest  pain presently -personally reviewed EKG--sinus, nonspecific T wave changes -Continue statin daily as diacussed above  Hyperlipidemia -home with pravastatin  Anxiety -Continue alprazolam at bedtime  GERD -Continue PPI  Hypokalemia -replete   Discharge Instructions      Discharge Instructions    Diet - low sodium heart healthy    Complete by:  As directed      Increase activity slowly    Complete by:  As directed             Medication List    STOP taking these medications        rosuvastatin 10 MG tablet  Commonly known as:  CRESTOR     ticagrelor 90 MG Tabs tablet  Commonly known as:  BRILINTA      TAKE these medications        acetaminophen 500 MG tablet  Commonly known as:  TYLENOL  Take 1,000 mg by mouth at bedtime.     ALPRAZolam 0.5 MG tablet  Commonly known as:  XANAX  Take 0.25 mg by mouth at bedtime as needed for anxiety.     aspirin EC 81 MG tablet  Take 81 mg by mouth daily.     clopidogrel 75 MG tablet  Commonly known as:  PLAVIX  Take 1 tablet (75 mg total) by mouth daily.     fluticasone 50 MCG/ACT nasal spray  Commonly known as:  FLONASE  Place 1 spray into the nose daily as needed for allergies.     hydroxypropyl methylcellulose / hypromellose 2.5 % ophthalmic solution  Commonly known as:  ISOPTO TEARS / GONIOVISC  Place 1 drop into both eyes 3 (three) times daily as needed for dry eyes.     MIRALAX packet  Generic drug:  polyethylene glycol  Take 17 g  by mouth daily as needed for moderate constipation.     nitroGLYCERIN 0.4 MG SL tablet  Commonly known as:  NITROSTAT  Place 1 tablet (0.4 mg total) under the tongue every 5 (five) minutes x 3 doses as needed for chest pain.     omeprazole 20 MG capsule  Commonly known as:  PRILOSEC  Take 20 mg by mouth daily.     pravastatin 40 MG tablet  Commonly known as:  PRAVACHOL  Take 1 tablet (40 mg total) by mouth daily at 6 PM.     traMADol 50 MG tablet  Commonly known as:   ULTRAM  Take 1 tablet (50 mg total) by mouth every 12 (twelve) hours as needed.     valsartan 40 MG tablet  Commonly known as:  DIOVAN  Take 1 tablet (40 mg total) by mouth daily.  Start taking on:  09/13/2015        Allergies  Allergen Reactions  . Doxycycline Itching  . Levaquin [Levofloxacin In D5w] Other (See Comments)    Joint swelling  . Lipitor [Atorvastatin] Nausea And Vomiting  . Lisinopril Other (See Comments)    unknown  . Niaspan [Niacin] Other (See Comments)    Joint swelling  . Penicillins Swelling    2/19 Pt states he had Penicillin challenge that was negative for reaction.  . Sulfa Antibiotics Itching  . Vytorin [Ezetimibe-Simvastatin] Nausea And Vomiting  . Clindamycin/Lincomycin Rash  . Zithromax [Azithromycin] Rash    Consultations:  Neurology    Procedures/Studies: Dg Chest 2 View  09/10/2015  CLINICAL DATA:  Stroke, history hypertension, coronary artery disease, former smoker EXAM: CHEST  2 VIEW COMPARISON:  04/21/2014 FINDINGS: Normal heart size, mediastinal contours, and pulmonary vascularity. Atherosclerotic calcification aorta. Chronic elevation RIGHT diaphragm. Lungs otherwise clear. No infiltrate, pleural effusion or pneumothorax. Probable BILATERAL nipple shadows, seen on lateral view as well. No acute osseous findings. IMPRESSION: No acute abnormalities. Electronically Signed   By: Lavonia Dana M.D.   On: 09/10/2015 07:49   Mr Brain Wo Contrast  09/09/2015  CLINICAL DATA:  82 year old male with slurred speech beginning 2 days ago. Bilateral arm numbness for 1 week. Initial encounter. EXAM: MRI HEAD WITHOUT CONTRAST TECHNIQUE: Multiplanar, multiecho pulse sequences of the brain and surrounding structures were obtained without intravenous contrast. COMPARISON:  Brain MRI 11/17/2012. FINDINGS: Major intracranial vascular flow voids are stable. 11 mm area of confluent restricted diffusion in the left paracentral pons (series 3, image 11). Mild associated T2  and FLAIR hyperintensity with no associated hemorrhage or mass effect. No other restricted diffusion. No midline shift, mass effect, evidence of mass lesion, ventriculomegaly, extra-axial collection or acute intracranial hemorrhage. Cervicomedullary junction and pituitary are within normal limits. Negative visualized cervical spine. Outside of the acute findings gray and white matter signal throughout the brain is stable and normal for age. Visible internal auditory structures appear normal. Visualized paranasal sinuses and mastoids are stable and well pneumatized. Negative orbit and scalp soft tissues. Normal bone marrow signal. IMPRESSION: 1. Small acute infarct in the left paracentral pons (basilar artery perforator branch territory). No associated hemorrhage or mass effect. 2. Otherwise normal for age noncontrast MRI appearance of the brain. Electronically Signed   By: Genevie Ann M.D.   On: 09/09/2015 13:56   Mr Jodene Nam Head/brain Wo Cm  09/09/2015  CLINICAL DATA:  Acute pontine infarct on MRI. Bilateral arm numbness, slurred speech, and unsteady gait. EXAM: MRA HEAD WITHOUT CONTRAST TECHNIQUE: Angiographic images of the Circle of Willis were  obtained using MRA technique without intravenous contrast. COMPARISON:  None. FINDINGS: The visualized distal vertebral arteries are widely patent and codominant. The right PICA is patent. The left PICA is small and suboptimally evaluated but grossly patent proximally. AICAs are not clearly identified. SCA origins are patent. Basilar artery is widely patent. Posterior communicating arteries are not identified. PCAs are patent without evidence of major branch occlusion or significant proximal stenosis. The internal carotid arteries are patent from skullbase to carotid termini without stenosis. ACAs and MCAs are patent without evidence of major branch occlusion or flow limiting stenosis. There is mild right MCA narrowing at the level of the bifurcation. IMPRESSION: 1. No major  branch occlusion or flow limiting stenosis. 2. Mild right MCA narrowing at the bifurcation. Electronically Signed   By: Logan Bores M.D.   On: 09/09/2015 16:52        Discharge Exam: Filed Vitals:   09/11/15 0953 09/11/15 1300  BP: 117/73 143/77  Pulse: 61 58  Temp: 98.1 F (36.7 C) 98.2 F (36.8 C)  Resp: 20 18   Filed Vitals:   09/11/15 0037 09/11/15 0537 09/11/15 0953 09/11/15 1300  BP: 112/72 149/73 117/73 143/77  Pulse: 56 60 61 58  Temp: 97.7 F (36.5 C) 98.3 F (36.8 C) 98.1 F (36.7 C) 98.2 F (36.8 C)  TempSrc: Oral Oral Oral Oral  Resp: 16 16 20 18   Height:      Weight:      SpO2: 95% 97% 97% 97%    General: Pt is alert, awake, not in acute distress Cardiovascular: RRR, S1/S2 +, no rubs, no gallops Respiratory: CTA bilaterally, no wheezing, no rhonchi Abdominal: Soft, NT, ND, bowel sounds + Extremities: no edema, no cyanosis   The results of significant diagnostics from this hospitalization (including imaging, microbiology, ancillary and laboratory) are listed below for reference.    Significant Diagnostic Studies: Dg Chest 2 View  09/10/2015  CLINICAL DATA:  Stroke, history hypertension, coronary artery disease, former smoker EXAM: CHEST  2 VIEW COMPARISON:  04/21/2014 FINDINGS: Normal heart size, mediastinal contours, and pulmonary vascularity. Atherosclerotic calcification aorta. Chronic elevation RIGHT diaphragm. Lungs otherwise clear. No infiltrate, pleural effusion or pneumothorax. Probable BILATERAL nipple shadows, seen on lateral view as well. No acute osseous findings. IMPRESSION: No acute abnormalities. Electronically Signed   By: Lavonia Dana M.D.   On: 09/10/2015 07:49   Mr Brain Wo Contrast  09/09/2015  CLINICAL DATA:  82 year old male with slurred speech beginning 2 days ago. Bilateral arm numbness for 1 week. Initial encounter. EXAM: MRI HEAD WITHOUT CONTRAST TECHNIQUE: Multiplanar, multiecho pulse sequences of the brain and surrounding structures  were obtained without intravenous contrast. COMPARISON:  Brain MRI 11/17/2012. FINDINGS: Major intracranial vascular flow voids are stable. 11 mm area of confluent restricted diffusion in the left paracentral pons (series 3, image 11). Mild associated T2 and FLAIR hyperintensity with no associated hemorrhage or mass effect. No other restricted diffusion. No midline shift, mass effect, evidence of mass lesion, ventriculomegaly, extra-axial collection or acute intracranial hemorrhage. Cervicomedullary junction and pituitary are within normal limits. Negative visualized cervical spine. Outside of the acute findings gray and white matter signal throughout the brain is stable and normal for age. Visible internal auditory structures appear normal. Visualized paranasal sinuses and mastoids are stable and well pneumatized. Negative orbit and scalp soft tissues. Normal bone marrow signal. IMPRESSION: 1. Small acute infarct in the left paracentral pons (basilar artery perforator branch territory). No associated hemorrhage or mass effect. 2. Otherwise normal  for age noncontrast MRI appearance of the brain. Electronically Signed   By: Genevie Ann M.D.   On: 09/09/2015 13:56   Mr Jodene Nam Head/brain Wo Cm  09/09/2015  CLINICAL DATA:  Acute pontine infarct on MRI. Bilateral arm numbness, slurred speech, and unsteady gait. EXAM: MRA HEAD WITHOUT CONTRAST TECHNIQUE: Angiographic images of the Circle of Willis were obtained using MRA technique without intravenous contrast. COMPARISON:  None. FINDINGS: The visualized distal vertebral arteries are widely patent and codominant. The right PICA is patent. The left PICA is small and suboptimally evaluated but grossly patent proximally. AICAs are not clearly identified. SCA origins are patent. Basilar artery is widely patent. Posterior communicating arteries are not identified. PCAs are patent without evidence of major branch occlusion or significant proximal stenosis. The internal carotid  arteries are patent from skullbase to carotid termini without stenosis. ACAs and MCAs are patent without evidence of major branch occlusion or flow limiting stenosis. There is mild right MCA narrowing at the level of the bifurcation. IMPRESSION: 1. No major branch occlusion or flow limiting stenosis. 2. Mild right MCA narrowing at the bifurcation. Electronically Signed   By: Logan Bores M.D.   On: 09/09/2015 16:52     Microbiology: No results found for this or any previous visit (from the past 240 hour(s)).   Labs: Basic Metabolic Panel:  Recent Labs Lab 09/09/15 1122 09/09/15 1136 09/11/15 0821  NA 140 143 140  K 3.5 3.5 3.4*  CL 112* 108 111  CO2 23  --  23  GLUCOSE 100* 102* 126*  BUN 11 13 15   CREATININE 0.79 0.80 0.92  CALCIUM 8.8*  --  8.8*   Liver Function Tests:  Recent Labs Lab 09/09/15 1122  AST 21  ALT 22  ALKPHOS 45  BILITOT 1.3*  PROT 6.5  ALBUMIN 3.5   No results for input(s): LIPASE, AMYLASE in the last 168 hours. No results for input(s): AMMONIA in the last 168 hours. CBC:  Recent Labs Lab 09/09/15 1122 09/09/15 1136  WBC 5.6  --   NEUTROABS 3.9  --   HGB 13.6 13.9  HCT 41.1 41.0  MCV 94.1  --   PLT 239  --    Cardiac Enzymes: No results for input(s): CKTOTAL, CKMB, CKMBINDEX, TROPONINI in the last 168 hours. BNP: Invalid input(s): POCBNP CBG:  Recent Labs Lab 09/09/15 1235  GLUCAP 99    Time coordinating discharge:  Greater than 30 minutes  Signed:  Megahn Killings, DO [facility] Pager: [phone] 09/11/2015, 2:41 PM

## 2015-09-12 ENCOUNTER — Telehealth: Payer: Self-pay | Admitting: *Deleted

## 2015-09-12 ENCOUNTER — Telehealth: Payer: Self-pay | Admitting: Interventional Cardiology

## 2015-09-12 ENCOUNTER — Encounter (HOSPITAL_COMMUNITY): Payer: Medicare Other

## 2015-09-12 LAB — VAS US CAROTID
LCCADDIAS: 19 cm/s
LCCADSYS: 68 cm/s
LCCAPDIAS: -18 cm/s
LEFT ECA DIAS: -12 cm/s
LEFT VERTEBRAL DIAS: -14 cm/s
LICADDIAS: -27 cm/s
LICADSYS: -67 cm/s
LICAPDIAS: -22 cm/s
LICAPSYS: -62 cm/s
Left CCA prox sys: -81 cm/s
RCCAPDIAS: 14 cm/s
RCCAPSYS: 63 cm/s
RIGHT ECA DIAS: -17 cm/s
RIGHT VERTEBRAL DIAS: 15 cm/s
Right cca dist sys: -78 cm/s

## 2015-09-12 NOTE — Telephone Encounter (Signed)
The pt called to make [place] aware that he had a stroke and was in the hospital from 7/7 until 7/9. I answered all of his concerns about changing Brilinta to Plavix and gave reassurance. He thanked me for calling him back so soon.

## 2015-09-12 NOTE — Telephone Encounter (Signed)
Spoke with wife about Mr. Glore participating in the STROKE -AF research study and to provided information regarding the protocol. Patient was on his way to PCP and requested that I call back tomorrow due to another appointment this afternoon. Some of the research protocol reviewed with wife ( 10 day window  Would end this week ) Questions encouraged and answered. Informed wife and patient I would call tomorrow.

## 2015-09-12 NOTE — Telephone Encounter (Signed)
NEw MEssage  Pt stated he was just seen @ [facility] for stroke- wanted to follow up w/ RN about some medication changes that occurred while he was admitted. Please call back and discuss.

## 2015-09-13 ENCOUNTER — Telehealth: Payer: Self-pay | Admitting: *Deleted

## 2015-09-13 NOTE — Telephone Encounter (Signed)
Telephone call returned to patient about the [facility] research study and being a participate. Study protocol review Questions encouraged and answered. At this time patient does not want to participate in study because he has so much going on. I thanked patient for allowing me to talk with him and wished him good health.

## 2015-09-14 ENCOUNTER — Encounter: Payer: Self-pay | Admitting: Sports Medicine

## 2015-09-14 ENCOUNTER — Encounter (HOSPITAL_COMMUNITY): Payer: Medicare Other

## 2015-09-16 ENCOUNTER — Encounter (HOSPITAL_COMMUNITY): Payer: Medicare Other

## 2015-09-19 ENCOUNTER — Encounter (HOSPITAL_COMMUNITY): Payer: Medicare Other

## 2015-09-21 ENCOUNTER — Encounter (HOSPITAL_COMMUNITY): Payer: Medicare Other

## 2015-09-23 ENCOUNTER — Encounter (HOSPITAL_COMMUNITY): Payer: Medicare Other

## 2015-09-26 ENCOUNTER — Encounter (HOSPITAL_COMMUNITY): Payer: Medicare Other

## 2015-09-28 ENCOUNTER — Encounter (HOSPITAL_COMMUNITY): Payer: Medicare Other

## 2015-09-30 ENCOUNTER — Encounter (HOSPITAL_COMMUNITY): Payer: Medicare Other

## 2015-10-03 ENCOUNTER — Encounter (HOSPITAL_COMMUNITY): Payer: Medicare Other

## 2015-10-04 ENCOUNTER — Encounter: Payer: Self-pay | Admitting: Sports Medicine

## 2015-10-04 ENCOUNTER — Ambulatory Visit (INDEPENDENT_AMBULATORY_CARE_PROVIDER_SITE_OTHER): Payer: Medicare Other | Admitting: Sports Medicine

## 2015-10-04 VITALS — BP 130/76 | HR 68 | Ht 72.0 in | Wt 200.0 lb

## 2015-10-04 DIAGNOSIS — I639 Cerebral infarction, unspecified: Secondary | ICD-10-CM

## 2015-10-04 DIAGNOSIS — M546 Pain in thoracic spine: Secondary | ICD-10-CM | POA: Diagnosis present

## 2015-10-04 NOTE — Progress Notes (Signed)
  Patient comes in today for follow-up on chronic neck, thoracic, and low back pain. Since his last office visit he suffered a CVA. Fortunately it did not leave him with any residual deficits. He is currently on Plavix and under the care of neurology. Our visit today consisted primarily of discussing his chronic back pain and intermittent numbness and tingling that he gets in his arms. It sounds like a nerve conduction study was done on his arms by neurology. Although it would be unusual, the patient reports that the neurologist told him that his numbness in his arms may be from his CVA. His symptoms definitely worsen with position. I explained to him that it is quite possible that he is getting bilateral upper extremity radiculopathy from underlying cervical spine arthropathy but our treatment options are quite limited at this time. We had previously discussed physical therapy with Barbaraann Barthel and I have once again provided him with John's information. The patient is taking 0.25 mg of Xanax at night which does help him sleep and helps control his pain. I gave him tramadol at his last visit but he is concerned about possible seizures so he decided against trying this. At this point, I will have him see Barbaraann Barthel for some guided physical therapy. I will leave things open-ended for him to follow-up with me as needed.  Total time spent with the patient was 15 minutes with greater than 50% of the time spent in face-to-face consultation discussing his diagnosis and treatment.

## 2015-10-04 NOTE — Patient Instructions (Signed)
  I believe that it is quite possible that the numbness in your arms is coming from your cervical spine. You have some arthritic changes that would certainly explain that. Since she recently had a stroke and are currently on blood thinners, our treatment for this is quite limited. It sounds like you do a pretty good job of adjusting your position when you're pain occurs. It also sounds like your low dose of Xanax at night is helping. At this point I would not do anything further to work this up. We could at some point down the road consider an epidural steroid injection if you get permission from her neurologist to come off of your blood thinners but that may be way down the road.

## 2015-10-05 ENCOUNTER — Encounter (HOSPITAL_COMMUNITY): Payer: Medicare Other

## 2015-10-07 ENCOUNTER — Encounter (HOSPITAL_COMMUNITY): Payer: Medicare Other

## 2015-10-10 ENCOUNTER — Encounter (HOSPITAL_COMMUNITY): Payer: Medicare Other

## 2015-10-12 ENCOUNTER — Encounter (HOSPITAL_COMMUNITY): Payer: Medicare Other

## 2015-10-13 ENCOUNTER — Telehealth: Payer: Self-pay | Admitting: Interventional Cardiology

## 2015-10-13 NOTE — Telephone Encounter (Signed)
Walk in pt form-patient dropped off paper labs-Lynn V back Friday 10/14/15.

## 2015-10-14 ENCOUNTER — Encounter (HOSPITAL_COMMUNITY): Payer: Medicare Other

## 2015-10-17 ENCOUNTER — Telehealth: Payer: Self-pay

## 2015-10-17 NOTE — Telephone Encounter (Addendum)
The pt c/o back pain that he states has gotten worse since he was switched from Brilinta to Plavix and from Crestor to Pravastatin. He wants to know if he has to stay on Plavix and can his cholesterol medication be changed?  On 09/09/15 His cholesterol was 158 HDL=33 LDL=105  Please advise.

## 2015-10-17 NOTE — Telephone Encounter (Signed)
Per Dr Irish Lack the pt is advised to continue to take both Plavix and Pravastatin and to contact his PCP concerning his back pain. The pt verbalized understanding and is in agreement with plan.

## 2015-10-21 ENCOUNTER — Other Ambulatory Visit: Payer: Medicare Other

## 2015-11-04 ENCOUNTER — Other Ambulatory Visit (HOSPITAL_COMMUNITY): Payer: Self-pay | Admitting: Specialist

## 2015-11-08 ENCOUNTER — Other Ambulatory Visit (HOSPITAL_COMMUNITY): Payer: Self-pay | Admitting: Specialist

## 2015-11-08 DIAGNOSIS — M545 Low back pain: Secondary | ICD-10-CM

## 2015-11-08 DIAGNOSIS — M546 Pain in thoracic spine: Secondary | ICD-10-CM

## 2015-11-11 ENCOUNTER — Encounter (HOSPITAL_COMMUNITY)
Admission: RE | Admit: 2015-11-11 | Discharge: 2015-11-11 | Disposition: A | Discharge status: Home / Self Care | Payer: Medicare Other | Source: Ambulatory Visit | Attending: Specialist | Admitting: Specialist

## 2015-11-11 DIAGNOSIS — M545 Low back pain: Secondary | ICD-10-CM

## 2015-11-11 DIAGNOSIS — M546 Pain in thoracic spine: Secondary | ICD-10-CM | POA: Insufficient documentation

## 2015-11-11 MED ORDER — TECHNETIUM TC 99M MEDRONATE IV KIT
25.0000 | PACK | Freq: Once | INTRAVENOUS | Status: AC | PRN
  Administered 2015-11-11: 25 via INTRAVENOUS

## 2016-01-02 ENCOUNTER — Telehealth (INDEPENDENT_AMBULATORY_CARE_PROVIDER_SITE_OTHER): Payer: Self-pay | Admitting: *Deleted

## 2016-01-02 NOTE — Telephone Encounter (Signed)
Pt called needing his xray report and ov notes from [place], states is starting PT this week and needs these. Will pick up form, please call when ready. C/B number [phone]

## 2016-01-02 NOTE — Telephone Encounter (Signed)
Will you get office notes for patient and forward to xray to get cd ready for patient.  Thanks. See msg below.Marland Kitchen

## 2016-01-10 ENCOUNTER — Other Ambulatory Visit: Payer: Self-pay | Admitting: Interventional Cardiology

## 2016-01-25 ENCOUNTER — Encounter: Payer: Self-pay | Admitting: Interventional Cardiology

## 2016-01-25 ENCOUNTER — Ambulatory Visit (INDEPENDENT_AMBULATORY_CARE_PROVIDER_SITE_OTHER): Payer: Medicare Other | Admitting: Interventional Cardiology

## 2016-01-25 VITALS — BP 120/80 | HR 88 | Ht 71.5 in | Wt 208.0 lb

## 2016-01-25 DIAGNOSIS — I2510 Atherosclerotic heart disease of native coronary artery without angina pectoris: Secondary | ICD-10-CM | POA: Diagnosis not present

## 2016-01-25 DIAGNOSIS — E782 Mixed hyperlipidemia: Secondary | ICD-10-CM | POA: Diagnosis not present

## 2016-01-25 DIAGNOSIS — I639 Cerebral infarction, unspecified: Secondary | ICD-10-CM | POA: Diagnosis not present

## 2016-01-25 DIAGNOSIS — I10 Essential (primary) hypertension: Secondary | ICD-10-CM | POA: Diagnosis not present

## 2016-01-25 DIAGNOSIS — R413 Other amnesia: Secondary | ICD-10-CM | POA: Diagnosis not present

## 2016-01-25 NOTE — Patient Instructions (Signed)
Medication Instructions:  Stop taking Pravastatin for 2 months to see if your symptoms improve then call us at [phone] to let us know how you are doing. All of your remaining medications remain the same.  Labwork: None  Testing/Procedures: None  Follow-Up: Your physician wants you to follow-up in: 4 months. You will receive a reminder letter in the mail two months in advance. If you don't receive a letter, please call our office to schedule the follow-up appointment.     If you need a refill on your cardiac medications before your next appointment, please call your pharmacy.

## 2016-01-25 NOTE — Progress Notes (Signed)
Cardiology Office Note   Date:  01/25/2016   ID:  Peter [name], DOB Jul 13, 1933, MRN [id number]  PCP:  Chesley Noon, MD    No chief complaint on file. f/u CAD- inferior MI, hyperlipidemia   Wt Readings from Last 3 Encounters:  01/25/16 94.3 kg (208 lb)  10/04/15 90.7 kg (200 lb)  09/09/15 89.8 kg (198 lb)       History of Present Illness: Peter [name] is a 82 y.o. male  with a hx of HTN, HL, elevated PSA. Admitted in 3/17 with inferior STEMI. LHC demonstrated 99% stenosis in the posterior lateral artery treated with a Synergy DES. He had mild to moderate nonobstructive disease elsewhere. EF was preserved by echocardiogram. Given need for upcoming prostate biopsy, it was recommended that he remain on dual antiplatelet therapy for at least 3 months minimum. He had issues with statins in the past and was restarted on low dose Crestor only but has stopped this.  He had some chest pain, dyspnea a few months after the MI in May 2017.  Different than his prior angina. Cardiolite in May 2017 showed some inferior scar but no ischemia.      Bruises easily.  His Crestor was stopped due to concerns about his memory.  After stoppping the Crestor, he initially felt better, but sx are now worse again.  Back pain especially is worse. He did not go on Crestor again, he started pravastatin.  He thinks the pravastatin may be responsible for all of his current problems including memory issues, weakness, and urinary retention.  He had a stroke in July 2017.  He has recovered well from the stroke.    He has some wisdom teeth that have been an issue for over a year.  He is seeing the oral surgeon in December for a consultation.    He reports muscle pain, confusion that he states are worse with pravastatin.  Weight gain has occurred as well.  He has difficulty urinating as well.  He was started on a medicine that has helped.  Possible low BP.  I reviewed his home BPs and these are  controlled.   He wants to stop the pravastatin as he has read about all of his sx being possible side effects.  He has not been walking because of the pains in his back and the above problems.     Past Medical History:  Diagnosis Date  . Anxiety   . Arthritis   . CAD (coronary artery disease)    a. 05/2015: STEMI: 99% stenosis of Posterolateral w/ Synergy DES placed.  . Cataract   . Chronic back pain   . History of nuclear stress test    ETT-Myoview 5/17: EF 54%, inf scar, no ischemia; Low Risk  . HLD (hyperlipidemia)   . Hypertension   . Loss of balance   . Prostate cancer screening    "for several years"  . Prostate disease   . Reflux   . Sepsis [facility]) February 2016   Pt stated he was treated at [facility] for this   . Shoulder pain   . Skin cancer     Past Surgical History:  Procedure Laterality Date  . APPENDECTOMY    . CARDIAC CATHETERIZATION N/A 05/31/2015   Procedure: Left Heart Cath and Coronary Angiography;  Surgeon: Jettie Booze, MD;  Location: [place] CV LAB;  Service: Cardiovascular;  Laterality: N/A;  . CARDIAC CATHETERIZATION N/A 05/31/2015   Procedure: Coronary  Stent Intervention;  Surgeon: Jettie Booze, MD;  Location: [place] CV LAB;  Service: Cardiovascular;  Laterality: N/A;  . COLON SURGERY    . HAND SURGERY Left   . VASECTOMY       Current Outpatient Prescriptions  Medication Sig Dispense Refill  . acetaminophen (TYLENOL) 500 MG tablet Take 1,300 mg by mouth at bedtime.     Marland Kitchen alfuzosin (UROXATRAL) 10 MG 24 hr tablet Take 10 mg by mouth daily.     Marland Kitchen aspirin EC 81 MG tablet Take 81 mg by mouth daily.    . cefdinir (OMNICEF) 300 MG capsule Take 300 mg by mouth 2 (two) times daily.     . clopidogrel (PLAVIX) 75 MG tablet TAKE 1 TABLET (75 MG TOTAL) BY MOUTH DAILY. 30 tablet 5  . Fluocinolone Acetonide 0.01 % OIL PLACE 3 DROPS INTO AFFECTED EAR(S) TWICE DAILY AS DIRECTED    . fluticasone (FLONASE) 50 MCG/ACT nasal spray Place 1  spray into the nose daily as needed for allergies.     Marland Kitchen gabapentin (NEURONTIN) 300 MG capsule Take 300 mg by mouth at bedtime.     . nitroGLYCERIN (NITROSTAT) 0.4 MG SL tablet Place 1 tablet (0.4 mg total) under the tongue every 5 (five) minutes x 3 doses as needed for chest pain. 25 tablet 3  . Polyethyl Glycol-Propyl Glycol (SYSTANE) 0.4-0.3 % GEL ophthalmic gel Place 1 application into both eyes 2 (two) times daily as needed.    . polyethylene glycol (MIRALAX) packet Take 17 g by mouth daily as needed for moderate constipation.     . pravastatin (PRAVACHOL) 40 MG tablet TAKE 1 TABLET (40 MG TOTAL) BY MOUTH DAILY AT 6 PM. 30 tablet 5  . valsartan (DIOVAN) 40 MG tablet Take 1 tablet (40 mg total) by mouth daily. 30 tablet 0   No current facility-administered medications for this visit.     Allergies:   Doxycycline; Levaquin [levofloxacin in d5w]; Lipitor [atorvastatin]; Lisinopril; Niaspan [niacin]; Penicillins; Sulfa antibiotics; Vytorin [ezetimibe-simvastatin]; Clindamycin/lincomycin; and Zithromax [azithromycin]    Social History:  The patient  reports that he quit smoking about 43 years ago. His smoking use included Cigarettes. He has a 10.00 pack-year smoking history. He has never used smokeless tobacco. He reports that he drinks about 0.6 oz of alcohol per week . He reports that he does not use drugs.   Family History:  The patient's family history includes ALS in his brother; Breast cancer in his daughter; Prostate cancer in his father; Stroke in his mother.    ROS:  Please see the history of present illness.   Otherwise, review of systems are positive for memory problems, back pain.   All other systems are reviewed and negative.    PHYSICAL EXAM: VS:  BP 120/80   Pulse 88   Ht 5' 11.5" (1.816 m)   Wt 94.3 kg (208 lb)   BMI 28.61 kg/m  , BMI Body mass index is 28.61 kg/m. GEN: Well nourished, well developed, in no acute distress  HEENT: normal  Neck: no JVD, carotid bruits,  or masses Cardiac: RRR; no murmurs, rubs, or gallops,no edema  Respiratory:  clear to auscultation bilaterally, normal work of breathing GI: soft, nontender, nondistended, + BS MS: no deformity or atrophy  Skin: warm and dry, no rash Neuro:  Strength and sensation are intact Psych: euthymic mood, full affect    Recent Labs: 05/31/2015: TSH 1.424 07/26/2015: Brain Natriuretic Peptide 84.1 09/09/2015: ALT 22; Hemoglobin 13.9; Platelets 239 09/11/2015:  BUN 15; Creatinine, Ser 0.92; Potassium 3.4; Sodium 140   Lipid Panel    Component Value Date/Time   CHOL 158 09/09/2015 1122   TRIG 100 09/09/2015 1122   HDL 33 (L) 09/09/2015 1122   CHOLHDL 4.8 09/09/2015 1122   VLDL 20 09/09/2015 1122   LDLCALC 105 (H) 09/09/2015 1122     Other studies Reviewed: Additional studies/ records that were reviewed today with results demonstrating: cath results.   ASSESSMENT AND PLAN:  1. CAD: No anginal sx.   COntinue dual antiplatelet therapy.   May need to be interrupted depending on the recs from the oral surgeon.  At this point, could stop Plavix for 5 days prior but if they can do oral surgery on the antiplatelet therapy, that may be less risky for his heart. 2. Old MI: No CHF sx. 3. HTN: BP well controlled. Has been on valsartan for many years. Readings controlled between [id number] systolic.  Will stop metoprolol. 4. Hyperlipidemia: Will stop pravastatin for 2 months to see if his other issues improve.  He will call and let us know.  I told him that I did not think the pravastatin was causing these problems. 5. Memory issues: they do predate the MI, but he thinks "they have definitely got worse."  No improvement since stopping metoprolol.  Now we will try pravastatin.  I suspect he has age related changes for his memory.   Current medicines are reviewed at length with the patient today.  The patient concerns regarding his medicines were addressed.  The following changes have been made:  As  above  Labs/ tests ordered today include: lipids,LFTs No orders of the defined types were placed in this encounter.   Recommend 150 minutes/week of aerobic exercise Low fat, low carb, high fiber diet recommended  Disposition:   FU in 4 months   Signed, Larae Grooms, MD  01/25/2016 9:55 AM    [facility] [place], [place], [place]  [address] Phone: [phone]; Fax: [phone]

## 2016-02-01 ENCOUNTER — Ambulatory Visit (INDEPENDENT_AMBULATORY_CARE_PROVIDER_SITE_OTHER): Payer: Medicare Other | Admitting: Specialist

## 2016-02-17 ENCOUNTER — Telehealth (INDEPENDENT_AMBULATORY_CARE_PROVIDER_SITE_OTHER): Payer: Self-pay | Admitting: *Deleted

## 2016-02-17 ENCOUNTER — Encounter (HOSPITAL_COMMUNITY): Payer: Self-pay

## 2016-02-17 ENCOUNTER — Emergency Department (HOSPITAL_COMMUNITY)
Admission: EM | Admit: 2016-02-17 | Discharge: 2016-02-17 | Disposition: A | Discharge status: Home / Self Care | Payer: Medicare Other | Attending: Emergency Medicine | Admitting: Emergency Medicine

## 2016-02-17 DIAGNOSIS — Z7902 Long term (current) use of antithrombotics/antiplatelets: Secondary | ICD-10-CM | POA: Insufficient documentation

## 2016-02-17 DIAGNOSIS — Z8673 Personal history of transient ischemic attack (TIA), and cerebral infarction without residual deficits: Secondary | ICD-10-CM | POA: Diagnosis not present

## 2016-02-17 DIAGNOSIS — I2510 Atherosclerotic heart disease of native coronary artery without angina pectoris: Secondary | ICD-10-CM | POA: Diagnosis not present

## 2016-02-17 DIAGNOSIS — Z85828 Personal history of other malignant neoplasm of skin: Secondary | ICD-10-CM | POA: Insufficient documentation

## 2016-02-17 DIAGNOSIS — R42 Dizziness and giddiness: Secondary | ICD-10-CM

## 2016-02-17 DIAGNOSIS — I252 Old myocardial infarction: Secondary | ICD-10-CM | POA: Insufficient documentation

## 2016-02-17 DIAGNOSIS — Z87891 Personal history of nicotine dependence: Secondary | ICD-10-CM | POA: Diagnosis not present

## 2016-02-17 DIAGNOSIS — Z7982 Long term (current) use of aspirin: Secondary | ICD-10-CM | POA: Insufficient documentation

## 2016-02-17 DIAGNOSIS — I10 Essential (primary) hypertension: Secondary | ICD-10-CM | POA: Insufficient documentation

## 2016-02-17 DIAGNOSIS — R55 Syncope and collapse: Secondary | ICD-10-CM | POA: Insufficient documentation

## 2016-02-17 HISTORY — DX: Cerebral infarction, unspecified: I63.9

## 2016-02-17 LAB — CBC
HCT: 42.4 % (ref 39.0–52.0)
Hemoglobin: 14.1 g/dL (ref 13.0–17.0)
MCH: 30.8 pg (ref 26.0–34.0)
MCHC: 33.3 g/dL (ref 30.0–36.0)
MCV: 92.6 fL (ref 78.0–100.0)
PLATELETS: 213 10*3/uL (ref 150–400)
RBC: 4.58 MIL/uL (ref 4.22–5.81)
RDW: 12.3 % (ref 11.5–15.5)
WBC: 8.8 10*3/uL (ref 4.0–10.5)

## 2016-02-17 LAB — BASIC METABOLIC PANEL
ANION GAP: 10 (ref 5–15)
BUN: 14 mg/dL (ref 6–20)
CO2: 21 mmol/L — ABNORMAL LOW (ref 22–32)
Calcium: 8.8 mg/dL — ABNORMAL LOW (ref 8.9–10.3)
Chloride: 106 mmol/L (ref 101–111)
Creatinine, Ser: 1.04 mg/dL (ref 0.61–1.24)
GFR calc Af Amer: >60 mL/min (ref >60)
Glucose, Bld: 109 mg/dL — ABNORMAL HIGH (ref 65–99)
POTASSIUM: 4.4 mmol/L (ref 3.5–5.1)
SODIUM: 137 mmol/L (ref 135–145)

## 2016-02-17 NOTE — ED Triage Notes (Signed)
Pt. Coming from PCP office for hypotension. Pt. Felt like he was going to pass out today. PCP took BP and 80/40. Pt. Takes vicodin for tooth pain, alfuzosin, and his valsartan. He thinks this combo of medicines dropped his BP. Pt. Given 479ml normal saline en route and BP came up to 112/76 mmHg. Pt. Hx of stroke and MI in last year.

## 2016-02-17 NOTE — Discharge Instructions (Signed)
It was our pleasure to provide your ER care today - we hope that you feel better.  Rest. Drink adequate fluids.  Hold/stop the alfuzosin.   Follow up with your doctor in the next coming week.   Return to ER if worse, new symptoms, weak/fainting, fevers, chest pain, trouble breathing, other concern.

## 2016-02-17 NOTE — Telephone Encounter (Signed)
ERROR

## 2016-02-17 NOTE — ED Provider Notes (Signed)
[name] DEPT Provider Note   CSN: [id number] Arrival date & time: 02/17/16  1217     History   Chief Complaint Chief Complaint  Patient presents with  . Hypotension    HPI Peter [name] is a 82 y.o. male.  Patient indicates had felt mily lightheaded earlier today, went to pcp office and was told bp 80/40.  EMS gave fluid bolus and reports normal bp, which remains normal on way to ED.  Patient indicates had recently stopped alfuzosin because his bp was lower, but did take dose of it earlier, and also took vicodin from tooth procedure 2 days ago - he feels like the combination of pain med and his bp meds made him feel lightheaded and lowered his bp.  Patient denies any current or recent cp or discomfort. Denies headache or other pain. No nv. Denies any fever or chills. No sweats. No syncope. No vertigo.    The history is provided by the patient.    Past Medical History:  Diagnosis Date  . Anxiety   . Arthritis   . CAD (coronary artery disease)    a. 05/2015: STEMI: 99% stenosis of Posterolateral w/ Synergy DES placed.  . Cataract   . Chronic back pain   . History of nuclear stress test    ETT-Myoview 5/17: EF 54%, inf scar, no ischemia; Low Risk  . HLD (hyperlipidemia)   . Hypertension   . Loss of balance   . Prostate cancer screening    "for several years"  . Prostate disease   . Reflux   . Sepsis [facility]) February 2016   Pt stated he was treated at [facility] for this   . Shoulder pain   . Skin cancer   . Stroke [facility])     Patient Active Problem List   Diagnosis Date Noted  . CVA (cerebral infarction) 09/09/2015  . HLD (hyperlipidemia)   . CAD (coronary artery disease)   . Acute CVA (cerebrovascular accident) ([place])   . ST elevation myocardial infarction (STEMI) of inferior wall ([place]) 05/31/2015  . Elevated PSA   . Hyperlipidemia   . Constipation 04/23/2014  . Sepsis ([place]) 04/22/2014  . Abdominal pain 04/22/2014  . Essential hypertension 04/22/2014   . Subacute ethmoidal sinusitis   . Lactic acid increased   . Leucocytosis   . Cough 04/21/2014  . Paresthesia 02/08/2014  . CTS (carpal tunnel syndrome) 12/01/2012  . Memory loss 11/14/2012  . Shoulder pain   . Reflux   . Chronic back pain   . Loss of balance   . Dyspnea 09/14/2012  . HBP (high blood pressure) 09/14/2012  . Abnormal CT of the chest 09/14/2012  . Shingles 11/10/2011    Past Surgical History:  Procedure Laterality Date  . APPENDECTOMY    . CARDIAC CATHETERIZATION N/A 05/31/2015   Procedure: Left Heart Cath and Coronary Angiography;  Surgeon: Jettie Booze, MD;  Location: [place] CV LAB;  Service: Cardiovascular;  Laterality: N/A;  . CARDIAC CATHETERIZATION N/A 05/31/2015   Procedure: Coronary Stent Intervention;  Surgeon: Jettie Booze, MD;  Location: [place] CV LAB;  Service: Cardiovascular;  Laterality: N/A;  . COLON SURGERY    . HAND SURGERY Left   . VASECTOMY         Home Medications    Prior to Admission medications   Medication Sig Start Date End Date Taking? Authorizing Provider  acetaminophen (TYLENOL) 500 MG tablet Take 1,300 mg by mouth at bedtime.  Historical Provider, MD  alfuzosin (UROXATRAL) 10 MG 24 hr tablet Take 10 mg by mouth daily.     Historical Provider, MD  aspirin EC 81 MG tablet Take 81 mg by mouth daily.    Historical Provider, MD  clopidogrel (PLAVIX) 75 MG tablet TAKE 1 TABLET (75 MG TOTAL) BY MOUTH DAILY. 01/11/16   Jettie Booze, MD  Fluocinolone Acetonide 0.01 % OIL PLACE 3 DROPS INTO AFFECTED EAR(S) TWICE DAILY AS DIRECTED 11/17/15   Historical Provider, MD  fluticasone (FLONASE) 50 MCG/ACT nasal spray Place 1 spray into the nose daily as needed for allergies.  01/28/13   Historical Provider, MD  gabapentin (NEURONTIN) 300 MG capsule Take 300 mg by mouth at bedtime.     Historical Provider, MD  nitroGLYCERIN (NITROSTAT) 0.4 MG SL tablet Place 1 tablet (0.4 mg total) under the tongue every 5 (five)  minutes x 3 doses as needed for chest pain. 06/01/15   Erma Heritage, PA  Polyethyl Glycol-Propyl Glycol (SYSTANE) 0.4-0.3 % GEL ophthalmic gel Place 1 application into both eyes 2 (two) times daily as needed.    Historical Provider, MD  polyethylene glycol (MIRALAX) packet Take 17 g by mouth daily as needed for moderate constipation.     Historical Provider, MD  pravastatin (PRAVACHOL) 40 MG tablet TAKE 1 TABLET (40 MG TOTAL) BY MOUTH DAILY AT 6 PM. 01/11/16   Jettie Booze, MD  valsartan (DIOVAN) 40 MG tablet Take 1 tablet (40 mg total) by mouth daily. 09/13/15   Orson Eva, MD    Family History Family History  Problem Relation Age of Onset  . Stroke Mother   . Prostate cancer Father   . ALS Brother   . Breast cancer Daughter     Social History Social History  Substance Use Topics  . Smoking status: Former Smoker    Packs/day: 1.00    Years: 10.00    Types: Cigarettes    Quit date: 03/05/1972  . Smokeless tobacco: Never Used  . Alcohol use 0.6 oz/week    1 Cans of beer per week     Comment: Once a month     Allergies   Doxycycline; Levaquin [levofloxacin in d5w]; Lipitor [atorvastatin]; Lisinopril; Niaspan [niacin]; Penicillins; Sulfa antibiotics; Vytorin [ezetimibe-simvastatin]; Clindamycin/lincomycin; and Zithromax [azithromycin]   Review of Systems Review of Systems  Constitutional: Negative for fever.  HENT: Negative for sore throat.   Eyes: Negative for redness.  Respiratory: Negative for shortness of breath.   Cardiovascular: Negative for chest pain.  Gastrointestinal: Negative for abdominal pain.  Genitourinary: Negative for flank pain.  Musculoskeletal:       Hx intermittent low back pain.   Skin: Negative for rash.  Neurological: Positive for light-headedness. Negative for headaches.  Hematological: Does not bruise/bleed easily.  Psychiatric/Behavioral: Negative for confusion.     Physical Exam Updated Vital Signs BP 118/63 (BP Location: Left  Arm)   Pulse 86   Temp 97.8 F (36.6 C) (Oral)   Resp 18   Ht 5\' 11"  (1.803 m)   Wt 90.7 kg   SpO2 94%   BMI 27.89 kg/m   Physical Exam  Constitutional: He is oriented to person, place, and time. He appears well-developed and well-nourished. No distress.  HENT:  Mouth/Throat: Oropharynx is clear and moist.  Eyes: Conjunctivae are normal.  Neck: Neck supple. No tracheal deviation present.  Cardiovascular: Normal rate, regular rhythm, normal heart sounds and intact distal pulses.   Pulmonary/Chest: Effort normal and breath sounds normal. No  accessory muscle usage. No respiratory distress.  Abdominal: Soft. He exhibits no distension. There is no tenderness.  No puls mass  Genitourinary:  Genitourinary Comments: No cva tenderness  Musculoskeletal: He exhibits no edema.  TLS spine non tender.   Neurological: He is alert and oriented to person, place, and time.  Ambulates w steady gait.   Skin: Skin is warm and dry. He is not diaphoretic.  Psychiatric: He has a normal mood and affect.  Nursing note and vitals reviewed.    ED Treatments / Results  Labs (all labs ordered are listed, but only abnormal results are displayed) Results for orders placed or performed during the hospital encounter of 02/17/16  CBC  Result Value Ref Range   WBC 8.8 4.0 - 10.5 K/uL   RBC 4.58 4.22 - 5.81 MIL/uL   Hemoglobin 14.1 13.0 - 17.0 g/dL   HCT 42.4 39.0 - 52.0 %   MCV 92.6 78.0 - 100.0 fL   MCH 30.8 26.0 - 34.0 pg   MCHC 33.3 30.0 - 36.0 g/dL   RDW 12.3 11.5 - 15.5 %   Platelets 213 150 - 400 K/uL  Basic metabolic panel  Result Value Ref Range   Sodium 137 135 - 145 mmol/L   Potassium 4.4 3.5 - 5.1 mmol/L   Chloride 106 101 - 111 mmol/L   CO2 21 (L) 22 - 32 mmol/L   Glucose, Bld 109 (H) 65 - 99 mg/dL   BUN 14 6 - 20 mg/dL   Creatinine, Ser 1.04 0.61 - 1.24 mg/dL   Calcium 8.8 (L) 8.9 - 10.3 mg/dL   GFR calc non Af Amer >60 >60 mL/min   GFR calc Af Amer >60 >60 mL/min   Anion gap 10  5 - 15   EKG  EKG Interpretation None       Radiology No results found.  Procedures Procedures (including critical care time)  Medications Ordered in ED Medications - No data to display   Initial Impression / Assessment and Plan / ED Course  I have reviewed the triage vital signs and the nursing notes.  Pertinent labs & imaging results that were available during my care of the patient were reviewed by me and considered in my medical decision making (see chart for details).  Clinical Course     Po fluids.  Recheck bp normal.  Patient continues to indicates he feels fine, asymptomatic.   Patient currently appears stable for d/c.    Final Clinical Impressions(s) / ED Diagnoses   Final diagnoses:  None    New Prescriptions New Prescriptions   No medications on file     Lajean Saver, MD 02/17/16 1445

## 2016-03-12 ENCOUNTER — Other Ambulatory Visit (INDEPENDENT_AMBULATORY_CARE_PROVIDER_SITE_OTHER): Payer: Self-pay | Admitting: Specialist

## 2016-03-14 ENCOUNTER — Telehealth (INDEPENDENT_AMBULATORY_CARE_PROVIDER_SITE_OTHER): Payer: Self-pay | Admitting: Specialist

## 2016-03-14 NOTE — Telephone Encounter (Signed)
Patient called advised he took his Rx for (gabapentin) to the pharmacy on Monday and was told the Rx needed the doctors auth. Patient said he is out of the medication and is worried to be out because of the side effect in stopping taking it all at once. The number to contact patient is [phone]

## 2016-03-15 NOTE — Telephone Encounter (Signed)
rx form was faxed back to pharmacy

## 2016-03-26 ENCOUNTER — Telehealth: Payer: Self-pay | Admitting: Interventional Cardiology

## 2016-03-26 NOTE — Telephone Encounter (Signed)
**Note De-Identified  Obfuscation** The pt was advised at his last OV with Dr Irish Lack on 01/25/16 to stop taking his Pravastatin due to his back pain to see if his symptoms improve.  The pt called back today and stated that he is unsure if his symptoms are better or not as he has not been taking his pain meds either.  He reports that he had to go to the ER by EMS from his PCPs office on 12/15 due to hypotension of 80/40.  ER notes: Kandis Nab, RN     Pt. Coming from PCP office for hypotension. Pt. Felt like he was going to pass out today. PCP took BP and 80/40. Pt. Takes vicodin for tooth pain, alfuzosin, and his valsartan. He thinks this combo of medicines dropped his BP. Pt. Given 425ml normal saline en route and BP came up to 112/76 mmHg. Pt. Hx of stroke and MI in last year.       He states that he had taken Alfuzosin to help him urinate and Vicodin for pain after dental procedure on that day and feels that is why his BP was so low.  He reports that he is not taking anything for pain or to help him urinate at this time but is contacting his MD today to see if there is another medication he can take that will not lower his BP.  Also, he c/o pain and coldness in his legs with occasional pain and is requesting a f/u. He is requesting an apt with either Dr Irish Lack or Richardson Dopp, PA-c to discuss Pravastatin and issues he has been having with his legs.  He is scheduled to see Richardson Dopp PA-c on 04/04/16 and states that he will call back to cancel appt if his leg symptoms improve.

## 2016-03-26 NOTE — Telephone Encounter (Signed)
Follow Up:; ° ° °Returning your call. °

## 2016-03-26 NOTE — Telephone Encounter (Signed)
Follow Up:    Pt have stopped his Pravastatin,he does not know if it made things better or not. He is also having problems with some of his other medicine.Please call to advise.

## 2016-03-26 NOTE — Telephone Encounter (Signed)
**Note De-identified  Obfuscation** LMTCB

## 2016-03-30 ENCOUNTER — Ambulatory Visit (INDEPENDENT_AMBULATORY_CARE_PROVIDER_SITE_OTHER): Payer: Medicare Other | Admitting: Specialist

## 2016-04-03 ENCOUNTER — Encounter: Payer: Self-pay | Admitting: Physician Assistant

## 2016-04-03 NOTE — Progress Notes (Signed)
Cardiology Office Note:    Date:  04/04/2016   ID:  Peter [name], DOB 1933-04-03, MRN [id number]  PCP:  Chesley Noon, MD  Cardiologist: Dr. Casandra Doffing  Electrophysiologist: N/a Urologist: Dr. Jeffie Pollock  Referring MD: Chesley Noon, MD   Chief Complaint  Patient presents with  . Follow-up    HTN, Myalgias    History of Present Illness:    Peter [name] is a 83 y.o. male with a hx of CAD, HTN, HL, elevated PSA. Admitted in 3/17 with inferior STEMI. LHC demonstrated 99% stenosis in the posterior lateral artery treated with a Synergy DES. He had mild to moderate nonobstructive disease elsewhere. EF was preserved by echocardiogram. Crestor has been stopped in the past due to concerns about his memory.  He was changed to Pravastatin.  He suffered a CVA in 7/17.  Last seen by Dr. Casandra Doffing in 11/17.  Pravastatin was held due to myalgias.  Metoprolol was stopped due to side effects as well.  He went to the ED with hypotension in 02/2016.  He presents today for evaluation of BP and myalgias related to his statin Rx.  He describes alternating coldness and burning in his legs for several weeks.  He spoke to his neurologist's office in [place] and was told he may have neuropathy.  The patient had taken Gabapentin for some arm numbness with good results.  He has also had side effects to all alpha blockers used to treat his BPH. He had stopped taking Alfluzosin in December due to low BPs.  He had more issues with his prostate and decided to resume it a few weeks later. At that time, he had been taking Tramadol for pain from a dental procedure.  He also had been treated for bronchitis.  He had an episode of symptomatic hypotension with all of this (BP 123XX123 systolic) and he went to the ED.  He questions if he should be on Valsartan at all.  He has more LUTS without Alfluzosin and wants to resume it.  He stopped taking Pravastatin due to myalgias.   Prior CV studies that were  reviewed today include:    Carotid US 7/17 Bilateral ICA 1-39  ETT-Myoview 5/17:  EF 54%, inf scar, no ischemia; Low Risk  Echo 7/17 EF 55-60, no RWMA, Gr 1 DD, mild AI, trivial MR  Echo 05/31/15 Mild Concentric LVH, EF 60-65%, no RWMA, Gr 2 DD, mild AI, mild MR, mild LAE, mild TR, PASP 26 mmHg  LHC 05/31/15 LAD mid 30%, D2 ostial 50% RI 50% LCx ok RCA R post AV branch 99% >> PCI: 2.5 x 16 mm Synergy DES   Past Medical History:  Diagnosis Date  . Anxiety   . Arthritis   . CAD (coronary artery disease)    a. 05/2015: STEMI: 99% stenosis of Posterolateral w/ Synergy DES placed.  . Cataract   . Chronic back pain   . History of nuclear stress test    ETT-Myoview 5/17: EF 54%, inf scar, no ischemia; Low Risk  . HLD (hyperlipidemia)   . Hypertension   . Loss of balance   . Prostate cancer screening    "for several years"  . Prostate disease   . Reflux   . Sepsis [facility]) February 2016   Pt stated he was treated at [facility] for this   . Shoulder pain   . Skin cancer   . Stroke [facility])     Past Surgical History:  Procedure Laterality  Date  . APPENDECTOMY    . CARDIAC CATHETERIZATION N/A 05/31/2015   Procedure: Left Heart Cath and Coronary Angiography;  Surgeon: Jettie Booze, MD;  Location: [place] [facility];  Service: Cardiovascular;  Laterality: N/A;  . CARDIAC CATHETERIZATION N/A 05/31/2015   Procedure: Coronary Stent Intervention;  Surgeon: Jettie Booze, MD;  Location: [place] [facility];  Service: Cardiovascular;  Laterality: N/A;  . COLON SURGERY    . HAND SURGERY Left   . VASECTOMY      Current Medications: Current Meds  Medication Sig  . acetaminophen (TYLENOL) 500 MG tablet Take 1,300 mg by mouth at bedtime.   Marland Kitchen aspirin EC 81 MG tablet Take 81 mg by mouth daily.  . clopidogrel (PLAVIX) 75 MG tablet TAKE 1 TABLET (75 MG TOTAL) BY MOUTH DAILY.  Marland Kitchen Fluocinolone Acetonide 0.01 % OIL PLACE 3 DROPS INTO AFFECTED EAR(S) TWICE DAILY AS DIRECTED  .  fluticasone (FLONASE) 50 MCG/ACT nasal spray Place 1 spray into the nose daily as needed for allergies.   . nitroGLYCERIN (NITROSTAT) 0.4 MG SL tablet Place 1 tablet (0.4 mg total) under the tongue every 5 (five) minutes x 3 doses as needed for chest pain.  Vladimir Faster Glycol-Propyl Glycol (SYSTANE) 0.4-0.3 % GEL ophthalmic gel Place 1 application into both eyes 2 (two) times daily as needed.  . polyethylene glycol (MIRALAX) packet Take 17 g by mouth daily as needed for moderate constipation.   . valsartan (DIOVAN) 40 MG tablet Take 1 tablet (40 mg total) by mouth daily.     Allergies:   Doxycycline; Levaquin [levofloxacin in d5w]; Lipitor [atorvastatin]; Lisinopril; Niaspan [niacin]; Penicillins; Sulfa antibiotics; Vytorin [ezetimibe-simvastatin]; Clindamycin/lincomycin; and Zithromax [azithromycin]   Social History   Social History  . Marital status: Married    Spouse name: [name]  . Number of children: 2  . Years of education: college   Occupational History  . Retired- Network engineer work  Retired   Social History Main Topics  . Smoking status: Former Smoker    Packs/day: 1.00    Years: 10.00    Types: Cigarettes    Quit date: 03/05/1972  . Smokeless tobacco: Never Used  . Alcohol use 0.6 oz/week    1 Cans of beer per week     Comment: Once a month  . Drug use: No  . Sexual activity: Yes   Other Topics Concern  . None   Social History Narrative   Patient lives at home with his spouse. [name])   Caffeine Use: 2 cups of coffee daily   Retired    [place]   Right handed     Family History:  The patient's family history includes ALS in his brother; Breast cancer in his daughter; Prostate cancer in his father; Stroke in his mother.   ROS:   Please see the history of present illness.    Review of Systems  Constitution: Positive for chills and malaise/fatigue.  Eyes: Positive for visual disturbance.  Cardiovascular: Positive for chest pain and syncope.    Hematologic/Lymphatic: Bruises/bleeds easily.  Musculoskeletal: Positive for back pain, joint pain and myalgias.  Genitourinary: Positive for incomplete emptying.  Neurological: Positive for loss of balance.   All other systems reviewed and are negative.   EKGs/Labs/Other Test Reviewed:    EKG:  EKG is  ordered today.  The ekg ordered today demonstrates NSR, HR 69, normal axis, QTc 420 ms, no change from prior tracing  Recent Labs: 05/31/2015: TSH 1.424 07/26/2015: Brain Natriuretic Peptide 84.1  09/09/2015: ALT 22 02/17/2016: BUN 14; Creatinine, Ser 1.04; Hemoglobin 14.1; Platelets 213; Potassium 4.4; Sodium 137   Recent Lipid Panel    Component Value Date/Time   CHOL 158 09/09/2015 1122   TRIG 100 09/09/2015 1122   HDL 33 (L) 09/09/2015 1122   CHOLHDL 4.8 09/09/2015 1122   VLDL 20 09/09/2015 1122   LDLCALC 105 (H) 09/09/2015 1122     Physical Exam:    VS:  BP 140/80 (BP Location: Right Arm, Patient Position: Sitting, Cuff Size: Normal)   Pulse 69   Ht 5\' 11"  (1.803 m)   Wt 210 lb (95.3 kg)   BMI 29.29 kg/m     Wt Readings from Last 3 Encounters:  04/04/16 210 lb (95.3 kg)  02/17/16 200 lb (90.7 kg)  01/25/16 208 lb (94.3 kg)     Physical Exam  Constitutional: He is oriented to person, place, and time. He appears well-developed and well-nourished. No distress.  HENT:  Head: Normocephalic and atraumatic.  Eyes: No scleral icterus.  Neck: Normal range of motion. No JVD present.  Cardiovascular: Normal rate, regular rhythm, S1 normal and S2 normal.   No murmur heard. Pulmonary/Chest: Effort normal and breath sounds normal. He has no wheezes. He has no rhonchi. He has no rales.  Abdominal: Soft. There is no tenderness.  Musculoskeletal: He exhibits no edema.  Neurological: He is alert and oriented to person, place, and time.  Skin: Skin is warm and dry.  Psychiatric: He has a normal mood and affect.    ASSESSMENT:    1. Essential hypertension   2. Pain in both  lower extremities   3. Coronary artery disease involving native coronary artery of native heart without angina pectoris   4. Pure hypercholesterolemia   5. Benign prostatic hyperplasia with lower urinary tract symptoms, symptom details unspecified    PLAN:    In order of problems listed above:  1. HTN - He has had issues with lower blood pressure while taking his alpha blocker for BPH. He's been intolerant to multiple alpha blockers in the past. He is really on a very low dose of valsartan. The episode that occurred when his blood pressure dropped into the 123XX123 was complicated by recent bronchitis as well as pain medication use for a dental procedure. After much discussion, we have decided together that he will resume his BPH medication. He will keep track of his blood pressure at home. If his blood pressure is running low, he can stop his valsartan. If his blood pressure then starts to run high, he will contact me. We could consider placing him on Hytrin for his blood pressure and BPH if needed in the future.  2. Leg pain - His bilateral leg pain with associated burning is somewhat concerning for radicular symptoms. I offered him ABIs but he thinks he had this done with his PCP. He will check with them. I have encouraged him to get back and see his neurologist or orthopedist for further evaluation.  3. CAD - Status post inferior STEMI in 3/17 treated with DES to the posterior AV branch of the RCA. No angina. Continue dual anti-platelet therapy.  He is intol of statins.   He stopped the beta blocker due to side effects.   4. HL - He has been intolerant to multiple statin drugs in the past. We had a long discussion regarding further cholesterol management and advantages of aggressively managing cholesterol in the context of coronary artery disease and previous myocardial infarction. We  briefly discussed the IMPROVE-IT trial. I have recommended starting Zetia 10 mg daily. Check lipids and LFTs in 2  months. If he is intolerant to Zetia, refer to the lipid clinic.  5. BPH - As noted, he will resume his alfuzosin. Continue follow-up with urology.  Total time spent with patient today 40 minutes. This includes reviewing records, evaluating the patient and coordinating care. Face-to-face time >50%.   Medication Adjustments/Labs and Tests Ordered: Current medicines are reviewed at length with the patient today.  Concerns regarding medicines are outlined above.  Medication changes, Labs and Tests ordered today are outlined in the Patient Instructions noted below. Patient Instructions  Medication Instructions:  1. Start Zetia 10 mg Once daily for cholesterol.  2. Go ahead and try to go back on the Alfuzosin. Keep track of your blood pressure. If your BP is in the low 100s or less (top number), stop the Diovan. If you stop the Diovan, call if your blood pressure runs 140/90 or higher most of the time.  Labwork: 2 months - FASTING Lipids and LFTs 06/01/16; COME IN ANYTIME FIRST THING IN THE MORNING ; LAB OPENS 7:30 AM   Testing/Procedures: None   Follow-Up: Dr. Casandra Doffing on 06/05/16 @ 9 AM  Any Other Special Instructions Will Be Listed Below (If Applicable).  If you need a refill on your cardiac medications before your next appointment, please call your pharmacy.   Signed, Richardson Dopp, PA-C  04/04/2016 9:40 PM    [facility] [address], [place], [place]  [address] Phone: [phone]; Fax: [phone]

## 2016-04-04 ENCOUNTER — Encounter: Payer: Self-pay | Admitting: Physician Assistant

## 2016-04-04 ENCOUNTER — Ambulatory Visit (INDEPENDENT_AMBULATORY_CARE_PROVIDER_SITE_OTHER): Payer: Medicare Other | Admitting: Physician Assistant

## 2016-04-04 VITALS — BP 140/80 | HR 69 | Ht 71.0 in | Wt 210.0 lb

## 2016-04-04 DIAGNOSIS — E7800 Pure hypercholesterolemia, unspecified: Secondary | ICD-10-CM

## 2016-04-04 DIAGNOSIS — M79605 Pain in left leg: Secondary | ICD-10-CM

## 2016-04-04 DIAGNOSIS — I10 Essential (primary) hypertension: Secondary | ICD-10-CM | POA: Diagnosis not present

## 2016-04-04 DIAGNOSIS — N401 Enlarged prostate with lower urinary tract symptoms: Secondary | ICD-10-CM

## 2016-04-04 DIAGNOSIS — I2510 Atherosclerotic heart disease of native coronary artery without angina pectoris: Secondary | ICD-10-CM

## 2016-04-04 DIAGNOSIS — M79604 Pain in right leg: Secondary | ICD-10-CM

## 2016-04-04 MED ORDER — EZETIMIBE 10 MG PO TABS: 10.0000 mg | ORAL_TABLET | Freq: Every day | ORAL | 3 refills | Status: DC

## 2016-04-04 NOTE — Patient Instructions (Addendum)
Medication Instructions:  1. Start Zetia 10 mg Once daily for cholesterol.  2. Go ahead and try to go back on the Alfuzosin. Keep track of your blood pressure. If your BP is in the low 100s or less (top number), stop the Diovan. If you stop the Diovan, call if your blood pressure runs 140/90 or higher most of the time.  Labwork: 2 months - FASTING Lipids and LFTs 06/01/16; COME IN ANYTIME FIRST THING IN THE MORNING ; LAB OPENS 7:30 AM   Testing/Procedures: None   Follow-Up: Dr. Casandra Doffing on 06/05/16 @ 9 AM  Any Other Special Instructions Will Be Listed Below (If Applicable).  If you need a refill on your cardiac medications before your next appointment, please call your pharmacy.

## 2016-04-05 ENCOUNTER — Ambulatory Visit (INDEPENDENT_AMBULATORY_CARE_PROVIDER_SITE_OTHER): Payer: Medicare Other | Admitting: Specialist

## 2016-04-24 ENCOUNTER — Telehealth: Payer: Self-pay | Admitting: Physician Assistant

## 2016-04-24 NOTE — Telephone Encounter (Signed)
New message   Pt verbalized that he wants rn to call him because he was working outside He said that he thinks its stress    Pt c/o BP issue: STAT if pt c/o blurred vision, one-sided weakness or slurred speech  1. What are your last 5 BP readings?  172/94   186/109   177/95 2. Are you having any other symptoms (ex. Dizziness, headache, blurred vision, passed out)? no 3. What is your BP issue? high

## 2016-04-25 NOTE — Telephone Encounter (Signed)
Agree. Richardson Dopp, PA-C   04/25/2016 8:19 PM

## 2016-04-25 NOTE — Telephone Encounter (Signed)
I s/w pt in regards to his recent BP readings being a little high. Pt states to me that he was most positive that he forgot to take his medications Monday morning on 2/19. Pt states he is back on track with his morning medication regimen and BP has been running a little better with numbers like 133/81, 135/90, 144/85. Pt denies any chest pain, dizziness, sob or pre syncope.   Pt states he takes his medication around 7 am and then takes his BP a few minutes after that. I asked pt to take medications then wait about 2 hours to take BP, sit for about 15-20 minutes and then take BP. I advised pt if BP is running high 150's or higher call the office to let us know. Pt is agreeable to plan of care.

## 2016-04-26 ENCOUNTER — Telehealth: Payer: Self-pay | Admitting: Physician Assistant

## 2016-04-26 NOTE — Telephone Encounter (Signed)
Follow Up:  My computer when down,would not let me finish the first encounter. Please call pt,about his blood pressure.

## 2016-04-26 NOTE — Telephone Encounter (Signed)
Spoke with patient who states he saw Richardson Dopp, [place] at the end of January and then called and spoke with Julaine Hua, CMA with concerns about BP.  He states BP was 171/96 mmHg on Tuesday. He states he was advised by Nicki Reaper to restart Alfuzosin for BPH but he has not restarted yet because he was concerned about lowering his BP. He states he got a new BP monitor yesterday. He is concerned this morning because his diastolic BP was 92 mmHg this morning before he took his medications.  Since he took medications at 0715 his readings are: 0750 137/84 mmHg 0810 132/86 mmHg I advised him to go ahead and take the alfuzosin and to monitor for any light-headedness or dizziness. I advised him these are signs that his BP is too low. I advised him to monitor BP later today and per Scott's instructions from 1/31 to hold Diovan if systolic BP is < 123XX123 mmHg. I advised him that his BP is stable currently and that it should be safe to resume the alfuzosin as advised. I advised him to call back with questions or concerns. He verbalized understanding and agreement and thanked me for the call.

## 2016-04-26 NOTE — Telephone Encounter (Signed)
Agree. Thank you! Richardson Dopp, PA-C   04/26/2016 9:16 AM

## 2016-04-26 NOTE — Telephone Encounter (Signed)
Follow Up:  Talked to [facility] yesterday about his blood pressure being high. She told him if it got between 160 to 170.After hours it got up to 171/96 and duri- close this encounter

## 2016-04-27 NOTE — Telephone Encounter (Signed)
Per Richardson Dopp, PA agreeable to plan of care per Julaine Hua, CMA and Christen Bame, RN in regards to pt BP. Pt is aware to call if he has any further concerns about his BP.

## 2016-05-21 ENCOUNTER — Telehealth: Payer: Self-pay | Admitting: Interventional Cardiology

## 2016-05-21 NOTE — Telephone Encounter (Signed)
New Message  Pt c/o medication issue:  1. Name of Medication: Zetia   2. How are you currently taking this medication (dosage and times per day)? 10mg    3. Are you having a reaction (difficulty breathing--STAT)? Stomach and throat burning  4. What is your medication issue? Pt would like to discuss the burning sensation he is having in her stomach and throat. Please call back to discuss

## 2016-05-21 NOTE — Telephone Encounter (Signed)
Spoke with pt above side effects of Zetia which he states he developed after taking it for 3 days.  He has since stopped it.  Advised the plan according to the most recent OV note on 1/31 is to refer pt to the [facility].  He is aware someone will be calling him to schedule an appt.

## 2016-05-23 ENCOUNTER — Encounter: Payer: Self-pay | Admitting: *Deleted

## 2016-05-23 ENCOUNTER — Other Ambulatory Visit: Payer: Self-pay | Admitting: Urology

## 2016-05-23 DIAGNOSIS — R972 Elevated prostate specific antigen [PSA]: Secondary | ICD-10-CM

## 2016-05-28 ENCOUNTER — Ambulatory Visit (HOSPITAL_COMMUNITY)
Admission: RE | Admit: 2016-05-28 | Discharge: 2016-05-28 | Disposition: A | Discharge status: Home / Self Care | Payer: Medicare Other | Source: Ambulatory Visit | Attending: Urology | Admitting: Urology

## 2016-05-28 DIAGNOSIS — R972 Elevated prostate specific antigen [PSA]: Secondary | ICD-10-CM | POA: Insufficient documentation

## 2016-05-28 LAB — POCT I-STAT CREATININE: CREATININE: 1 mg/dL (ref 0.61–1.24)

## 2016-05-28 MED ORDER — GADOBENATE DIMEGLUMINE 529 MG/ML IV SOLN
20.0000 mL | Freq: Once | INTRAVENOUS | Status: AC | PRN
  Administered 2016-05-28: 20 mL via INTRAVENOUS

## 2016-06-01 ENCOUNTER — Other Ambulatory Visit: Payer: Medicare Other | Admitting: *Deleted

## 2016-06-01 ENCOUNTER — Telehealth: Payer: Self-pay | Admitting: *Deleted

## 2016-06-01 DIAGNOSIS — E7800 Pure hypercholesterolemia, unspecified: Secondary | ICD-10-CM

## 2016-06-01 NOTE — Telephone Encounter (Signed)
Pt notified of lab results by phone with verbal understanding. Pt states he has not taken the Zetia for a about 1-2 weeks because he thought he had a reaction to the medication. Pt states he was off the Zetia for few days when the same symptoms came back, throat burning, stomach pain, diarrhea. Pt thinks maybe he did not have a reaction to the Zetia and maybe it is the medication he is on for his prostate. Pt said he really would like to Dr. Irish Lack a little bit more and get his insight further as to what he should do. Pt is scheduled to see Dr. Irish Lack 4/3 9 am.

## 2016-06-01 NOTE — Telephone Encounter (Signed)
-----   Message from Liliane Shi, [place] sent at 06/01/2016  4:38 PM EDT ----- Lucrezia [place] to statins.  Now on Zetia. Please call the patient Lipids ok given that he can only take Zetia. LFTs ok. Continue with current treatment plan. Richardson Dopp, PA-C   06/01/2016 4:38 PM

## 2016-06-04 LAB — HEPATIC FUNCTION PANEL
ALK PHOS: 51 IU/L (ref 39–117)
ALT: 15 IU/L (ref 0–44)
AST: 16 IU/L (ref 0–40)
Albumin: 4.1 g/dL (ref 3.5–4.7)
BILIRUBIN, DIRECT: 0.22 mg/dL (ref 0.00–0.40)
Bilirubin Total: 1 mg/dL (ref 0.0–1.2)
TOTAL PROTEIN: 6.4 g/dL (ref 6.0–8.5)

## 2016-06-04 LAB — LIPID PANEL
CHOLESTEROL TOTAL: 164 mg/dL (ref 100–199)
Chol/HDL Ratio: 4.6 ratio units (ref 0.0–5.0)
HDL: 36 mg/dL — ABNORMAL LOW (ref >39)
LDL Calculated: 111 mg/dL — ABNORMAL HIGH (ref 0–99)
Triglycerides: 83 mg/dL (ref 0–149)
VLDL Cholesterol Cal: 17 mg/dL (ref 5–40)

## 2016-06-05 ENCOUNTER — Encounter: Payer: Self-pay | Admitting: Interventional Cardiology

## 2016-06-05 ENCOUNTER — Ambulatory Visit (INDEPENDENT_AMBULATORY_CARE_PROVIDER_SITE_OTHER): Payer: Medicare Other | Admitting: Interventional Cardiology

## 2016-06-05 VITALS — BP 128/82 | HR 88 | Ht 71.0 in | Wt 207.0 lb

## 2016-06-05 DIAGNOSIS — I2510 Atherosclerotic heart disease of native coronary artery without angina pectoris: Secondary | ICD-10-CM

## 2016-06-05 DIAGNOSIS — E782 Mixed hyperlipidemia: Secondary | ICD-10-CM

## 2016-06-05 DIAGNOSIS — I10 Essential (primary) hypertension: Secondary | ICD-10-CM

## 2016-06-05 DIAGNOSIS — Z01810 Encounter for preprocedural cardiovascular examination: Secondary | ICD-10-CM

## 2016-06-05 DIAGNOSIS — R413 Other amnesia: Secondary | ICD-10-CM | POA: Diagnosis not present

## 2016-06-05 MED ORDER — EZETIMIBE 10 MG PO TABS: 10.0000 mg | ORAL_TABLET | Freq: Every day | ORAL | 3 refills | Status: DC

## 2016-06-05 NOTE — Progress Notes (Signed)
Cardiology Office Note   Date:  06/05/2016   ID:  [name], DOB 1933/05/09, MRN [id number]  PCP:  Aura Dials, PA-C    No chief complaint on file. f/u CAD- inferior MI, hyperlipidemia   Wt Readings from Last 3 Encounters:  06/05/16 207 lb (93.9 kg)  04/04/16 210 lb (95.3 kg)  02/17/16 200 lb (90.7 kg)       History of Present Illness: Peter [name] is a 83 y.o. male  with a hx of HTN, HL, elevated PSA. Admitted in 3/17 with inferior STEMI. LHC demonstrated 99% stenosis in the posterior lateral artery treated with a Synergy DES. He had mild to moderate nonobstructive disease elsewhere. EF was preserved by echocardiogram. Given need for upcoming prostate biopsy, it was recommended that he remain on dual antiplatelet therapy for at least 3 months minimum. He had issues with statins in the past and was restarted on low dose Crestor only but has stopped this.  He had some chest pain, dyspnea after the MI in May 2017.  Different than his prior angina. Cardiolite in May 2017 showed some inferior scar but no ischemia.      Bruises easily.  His Crestor was stopped due to concerns about his memory.  After stoppping the Crestor, he initially felt better, but sx are now worse again.  Back pain especially is worse. He did not go on Crestor again, he started pravastatin.  He thinks the pravastatin may be responsible for all of his current problems including memory issues, weakness, and urinary retention.  He stopped pravastatin.    He had a stroke in July 2017.  He has recovered well from the stroke.    He has some wisdom teeth that have been an issue for over a year.  He had teeth pulled in Dec 2107.  He had some low BP after this when he was on pain killers in combination with his prostate med.  He needs a prostate biopsy.  He wants to come off of the Plavix.  He had put this off or a few months.  He also feels that he is having a reaction to Zetia.  He stopped this  medicine.  BP had been stable, but he had some diarrhea/abdominal /pelvic burning which he thought was related to Zetia.    He has had these sx while of the Zetia as well.  He also had some diarrhea.  He continues to complain about his memory.  He is off of the statins for some time.  Despite stoppig the meds, he reports that his memory concerns remain.    Past Medical History:  Diagnosis Date  . Anxiety   . Arthritis   . CAD (coronary artery disease)    a. 05/2015: STEMI: 99% stenosis of Posterolateral w/ Synergy DES placed.  . Cataract   . Chronic back pain   . History of nuclear stress test    ETT-Myoview 5/17: EF 54%, inf scar, no ischemia; Low Risk  . HLD (hyperlipidemia)   . Hypertension   . Loss of balance   . Prostate cancer screening    "for several years"  . Prostate disease   . Reflux   . Sepsis [facility]) February 2016   Pt stated he was treated at [facility] for this   . Shoulder pain   . Skin cancer   . Stroke [facility])     Past Surgical History:  Procedure Laterality Date  . APPENDECTOMY    .  CARDIAC CATHETERIZATION N/A 05/31/2015   Procedure: Left Heart Cath and Coronary Angiography;  Surgeon: Jettie Booze, MD;  Location: [place] [facility];  Service: Cardiovascular;  Laterality: N/A;  . CARDIAC CATHETERIZATION N/A 05/31/2015   Procedure: Coronary Stent Intervention;  Surgeon: Jettie Booze, MD;  Location: [place] [facility];  Service: Cardiovascular;  Laterality: N/A;  . COLON SURGERY    . HAND SURGERY Left   . VASECTOMY       Current Outpatient Prescriptions  Medication Sig Dispense Refill  . acetaminophen (TYLENOL) 500 MG tablet Take 1,300 mg by mouth at bedtime.     Marland Kitchen alfuzosin (UROXATRAL) 10 MG 24 hr tablet Take 10 mg by mouth daily with breakfast.    . aspirin EC 81 MG tablet Take 81 mg by mouth daily.    . clopidogrel (PLAVIX) 75 MG tablet TAKE 1 TABLET (75 MG TOTAL) BY MOUTH DAILY. 30 tablet 5  . Fluocinolone Acetonide 0.01 % OIL PLACE 3  DROPS INTO AFFECTED EAR(S) TWICE DAILY AS DIRECTED    . fluticasone (FLONASE) 50 MCG/ACT nasal spray Place 1 spray into the nose daily as needed for allergies.     . nitroGLYCERIN (NITROSTAT) 0.4 MG SL tablet Place 1 tablet (0.4 mg total) under the tongue every 5 (five) minutes x 3 doses as needed for chest pain. 25 tablet 3  . Polyethyl Glycol-Propyl Glycol (SYSTANE) 0.4-0.3 % GEL ophthalmic gel Place 1 application into both eyes 2 (two) times daily as needed.    . polyethylene glycol (MIRALAX) packet Take 17 g by mouth daily as needed for moderate constipation.     . valsartan (DIOVAN) 40 MG tablet Take 1 tablet (40 mg total) by mouth daily. 30 tablet 0   No current facility-administered medications for this visit.     Allergies:   Doxycycline; Levaquin [levofloxacin in d5w]; Lipitor [atorvastatin]; Lisinopril; Niaspan [niacin]; Penicillins; Sulfa antibiotics; Vytorin [ezetimibe-simvastatin]; Clindamycin/lincomycin; and Zithromax [azithromycin]    Social History:  The patient  reports that he quit smoking about 44 years ago. His smoking use included Cigarettes. He has a 10.00 pack-year smoking history. He has never used smokeless tobacco. He reports that he drinks about 0.6 oz of alcohol per week . He reports that he does not use drugs.   Family History:  The patient's family history includes ALS in his brother; Breast cancer in his daughter; Prostate cancer in his father; Stroke in his mother.    ROS:  Please see the history of present illness.   Otherwise, review of systems are positive for memory problems, back pain.   All other systems are reviewed and negative.    PHYSICAL EXAM: VS:  BP 128/82   Pulse 88   Ht 5\' 11"  (1.803 m)   Wt 207 lb (93.9 kg)   SpO2 98%   BMI 28.87 kg/m  , BMI Body mass index is 28.87 kg/m. GEN: Well nourished, well developed, in no acute distress  HEENT: normal  Neck: no JVD, carotid bruits, or masses Cardiac: RRR; no murmurs, rubs, or gallops,no edema    Respiratory:  clear to auscultation bilaterally, normal work of breathing GI: soft, nontender, nondistended, + BS MS: no deformity or atrophy  Skin: warm and dry, no rash Neuro:  Strength and sensation are intact Psych: euthymic mood, full affect    Recent Labs: 07/26/2015: Brain Natriuretic Peptide 84.1 02/17/2016: BUN 14; Hemoglobin 14.1; Platelets 213; Potassium 4.4; Sodium 137 05/28/2016: Creatinine, Ser 1.00 06/01/2016: ALT 15   Lipid Panel  Component Value Date/Time   CHOL 164 06/01/2016 0803   TRIG 83 06/01/2016 0803   HDL 36 (L) 06/01/2016 0803   CHOLHDL 4.6 06/01/2016 0803   CHOLHDL 4.8 09/09/2015 1122   VLDL 20 09/09/2015 1122   LDLCALC 111 (H) 06/01/2016 0803     Other studies Reviewed: Additional studies/ records that were reviewed today with results demonstrating: 2017 stress test result; LDL above target off of Zetia.   ASSESSMENT AND PLAN:  1. CAD: No anginal sx.   COntinue dual antiplatelet therapy.   Okay to hold clopidogrel 5 days prior to prostate biopsy. I think long-term, he only needs one antiplatelet agent. He will stay on aspirin during the time of his prostate biopsy. After the biopsy, I would like for him to stop the aspirin and resume clopidogrel monotherapy 75 mg daily.  This should help with some of the nuisance bruising. 2. Old MI: No CHF sx. 3. HTN: BP well controlled. Has been on valsartan for many years. Readings controlled between 299-371 systolic.  Controlled off of metoprolol. Episode of low blood pressure after his oral surgery well taking painkillers. I think this was related to the medications he was taking at that time. 4. Hyperlipidemia: Will stop pravastatin for 2 months to see if his other issues improve.  He will call and let us know.  I told him that I did not think the pravastatin was causing these problems.  He is going to restart Zetia given the time course of his side effects. 5. Memory issues: they do predate the MI, but he  thinks "they have definitely got worse."  No improvement since stopping metoprolol or pravastatin.  I suspect he has age related changes for his memory.  He is starting to agree with this idea.   Current medicines are reviewed at length with the patient today.  The patient concerns regarding his medicines were addressed.  The following changes have been made:  As above  Labs/ tests ordered today include:  No orders of the defined types were placed in this encounter.   Recommend 150 minutes/week of aerobic exercise Low fat, low carb, high fiber diet recommended  Disposition:   FU in 4 months   Signed, Larae Grooms, MD  06/05/2016 9:16 AM    [facility] [place], [place], [place]  [address] Phone: [phone]; Fax: [phone]

## 2016-06-05 NOTE — Patient Instructions (Addendum)
Medication Instructions:  Your physician recommends that you continue on your current medications as directed. Please refer to the Current Medication list given to you today.   Labwork: None ordered.  Testing/Procedures: None ordered.  Follow-Up: Your physician wants you to follow-up in: 6 months with Dr. Irish Lack. You will receive a reminder letter in the mail two months in advance. If you don't receive a letter, please call our office to schedule the follow-up appointment.   Any Other Special Instructions Will Be Listed Below (If Applicable).  Hold Plavix 5 days prior to prostate biopsy. Continue Aspirin until after the biopsy. Restart Plavix after the prostate biopsy and stop taking Aspirin after the biopsy.   If you need a refill on your cardiac medications before your next appointment, please call your pharmacy.

## 2016-06-07 ENCOUNTER — Telehealth: Payer: Self-pay | Admitting: Interventional Cardiology

## 2016-06-07 NOTE — Telephone Encounter (Signed)
New message    Pt is calling to see if clearance was faxed to [facility] to [place] [phone]

## 2016-06-07 NOTE — Telephone Encounter (Signed)
Clearance from OV on 06/05/16 was faxed to [facility] at [facility] at [phone] per patient's request. Confirmation received.   ASSESSMENT AND PLAN:  1. CAD: No anginal sx.   COntinue dual antiplatelet therapy.   Okay to hold clopidogrel 5 days prior to prostate biopsy. I think long-term, he only needs one antiplatelet agent. He will stay on aspirin during the time of his prostate biopsy. After the biopsy, I would like for him to stop the aspirin and resume clopidogrel monotherapy 75 mg daily.

## 2016-07-03 ENCOUNTER — Telehealth: Payer: Self-pay | Admitting: Interventional Cardiology

## 2016-07-03 NOTE — Telephone Encounter (Signed)
Patient calling and states that last night he felt dizzy when he got out of bed and it happened again. Patient states that the dizziness only lasted a few seconds and resolved on its own. Patient denies any chest pain, arm or jaw pain, sweating, or SOB. Patient states that he is scheduled for a prostate biopsy on Thursday and has been holding his plavix. Patient states that he is taking his ASA still. Patient states that his BP was 126/74 with HR 75. Patient wondering if these symptoms are related to him stopping the plavix. When asked about his fluid status, patient states that he could do better. Patient advised that these symptoms sound like they are related to position changes and that he should change positions slowly and stay hydrated. Patient very nervous. I advised patient that I would speak with the DOD regarding his symptoms and call him back. Patient verbalized understanding.

## 2016-07-03 NOTE — Telephone Encounter (Signed)
Discussed with DOD Dr. Acie Fredrickson and he agrees that the symptoms sound like they are related to position changes and not related to holding his plavix. Patient made aware and advised to change positions slowly and stay hydrated. Patient advised that he can continue holding his plavix in preparation for his prostate biopsy on Thursday. Patient advised on ER precautions. Patient made aware that information would be forwarded to Dr. Irish Lack to make him aware. Patient verbalized understanding and appreciated the call.

## 2016-07-03 NOTE — Telephone Encounter (Signed)
New Message;   Pt have had 2 spells of dizziness and lightheaded Dr Ralene Muskrat office told him to contact Dr Maryelizabeth Rowan he stopped his Plavix on Frfiday.They are not sure if stooping his Plavix might be causing him to feel this way.Please call today if possible.Marland Kitchen

## 2016-07-11 ENCOUNTER — Encounter: Payer: Self-pay | Admitting: Radiation Oncology

## 2016-07-18 ENCOUNTER — Telehealth: Payer: Self-pay | Admitting: Interventional Cardiology

## 2016-07-18 NOTE — Telephone Encounter (Signed)
New Message  pt voiced had prostate biopsy and found cancer.  Pt voiced wanting to know if he has the option for biopsy/cancer removal.

## 2016-07-18 NOTE — Telephone Encounter (Signed)
Returned call to patient.He stated he found out he has prostate cancer and will be seeing a cancer radiologist next week.He wanted to ask Dr.Varanasi if he would be a candidate for prostate surgery if they offered that option.Advised I will send message to Dr.Varanasi for advice.

## 2016-07-19 NOTE — Telephone Encounter (Signed)
He would be a candidate for surgery if needed,  from a cardiac standpoint.

## 2016-07-20 NOTE — Telephone Encounter (Signed)
Returned call to patient Dr.Varanasi's recommendations given.

## 2016-07-24 ENCOUNTER — Other Ambulatory Visit: Payer: Self-pay | Admitting: Interventional Cardiology

## 2016-07-24 ENCOUNTER — Encounter: Payer: Self-pay | Admitting: Radiation Oncology

## 2016-07-24 NOTE — Progress Notes (Signed)
GU Location of Tumor / Histology: prostatic adenocarcinoma  If Prostate Cancer, Gleason Score is (3 + 4) on 07/05/2016 and PSA is (5.41) on 05/16/2016 while on Finasteride and PIRADS 4 lesion in left mid prostate that is 9 mm in size. Prostate volume: 44.2 grams   Peter [name] presented to Dr. Jeffie Pollock as an established patient seen on numerous occassions 03/29/2016 (prostate burning) and 05/16/2016 (prostate burning) and again for elevated PSA on 07/05/2016 months ago with signs/symptoms of penile pain and eleavated prostate.  Patient has history of chronic pelvic pain and was off of Alfuzosin due to hypotensive episodes and restarted Alfuzosin with permission from cardiologist and is voiding better.     Biopsies of prostate  (if applicable) revealed: 94/70/9628     Past/Anticipated interventions by urology, if any: Prostate Biopsy by Dr. Jeffie Pollock  Past/Anticipated interventions by medical oncology, if any: None  Weight changes, if any: No  Bowel/Bladder complaints, if any: IPSS 17 with nocturia, intermittent urinary stream, inability to start stream, and penile pain. Reports an occasional burning sensation in his penis but, not while voiding. Reports occasional hematuria when straining to defecate. Reports occasional urinary leakage that began several weeks ago. Reports occasional urgency. Reports constipation. Reports he is on his fourth day of Cefalexin for a UTI and has 3 days left. Reports he fell on a metal beam years ago and split open his testicles.   Nausea/Vomiting, if any: No  Pain issues, if any: In March 2018 was having chronic pelvic pain (testicular pain), difficulty and pain with voiding.   SAFETY ISSUES:  Prior radiation? No  Pacemaker/ICD? No  Possible current pregnancy? No  Is the patient on methotrexate? No  Current Complaints / other details: 83 year old male. Retired and married.  He is active and likes to play golf. Leaning toward a prostatectomy.

## 2016-07-25 ENCOUNTER — Ambulatory Visit
Admission: RE | Admit: 2016-07-25 | Discharge: 2016-07-25 | Disposition: A | Discharge status: Home / Self Care | Payer: Medicare Other | Source: Ambulatory Visit | Attending: Radiation Oncology | Admitting: Radiation Oncology

## 2016-07-25 ENCOUNTER — Encounter: Payer: Self-pay | Admitting: Radiation Oncology

## 2016-07-25 VITALS — BP 110/63 | HR 76 | Resp 16 | Ht 71.5 in | Wt 207.0 lb

## 2016-07-25 DIAGNOSIS — Z8673 Personal history of transient ischemic attack (TIA), and cerebral infarction without residual deficits: Secondary | ICD-10-CM | POA: Diagnosis not present

## 2016-07-25 DIAGNOSIS — Z7902 Long term (current) use of antithrombotics/antiplatelets: Secondary | ICD-10-CM | POA: Diagnosis not present

## 2016-07-25 DIAGNOSIS — C61 Malignant neoplasm of prostate: Secondary | ICD-10-CM

## 2016-07-25 DIAGNOSIS — E785 Hyperlipidemia, unspecified: Secondary | ICD-10-CM | POA: Diagnosis not present

## 2016-07-25 DIAGNOSIS — I10 Essential (primary) hypertension: Secondary | ICD-10-CM | POA: Diagnosis not present

## 2016-07-25 DIAGNOSIS — Z880 Allergy status to penicillin: Secondary | ICD-10-CM | POA: Diagnosis not present

## 2016-07-25 DIAGNOSIS — Z87891 Personal history of nicotine dependence: Secondary | ICD-10-CM | POA: Insufficient documentation

## 2016-07-25 DIAGNOSIS — Z7982 Long term (current) use of aspirin: Secondary | ICD-10-CM | POA: Insufficient documentation

## 2016-07-25 DIAGNOSIS — I2510 Atherosclerotic heart disease of native coronary artery without angina pectoris: Secondary | ICD-10-CM | POA: Insufficient documentation

## 2016-07-25 DIAGNOSIS — F419 Anxiety disorder, unspecified: Secondary | ICD-10-CM | POA: Diagnosis not present

## 2016-07-25 DIAGNOSIS — Z9109 Other allergy status, other than to drugs and biological substances: Secondary | ICD-10-CM | POA: Insufficient documentation

## 2016-07-25 DIAGNOSIS — K219 Gastro-esophageal reflux disease without esophagitis: Secondary | ICD-10-CM | POA: Insufficient documentation

## 2016-07-25 DIAGNOSIS — Z79899 Other long term (current) drug therapy: Secondary | ICD-10-CM | POA: Insufficient documentation

## 2016-07-25 HISTORY — DX: Malignant neoplasm of prostate: C61

## 2016-07-25 NOTE — Progress Notes (Signed)
See progress note under physician encounter. 

## 2016-07-25 NOTE — Progress Notes (Signed)
Radiation Oncology         [phone]) [phone] ________________________________  Initial outpatient Consultation  Name: Peter [name] MRN: [id number]  Date: 07/25/2016  DOB: 07-Jul-1933  GQ:[id number], Domingo Pulse, PA-C  Irine Seal, MD   REFERRING PHYSICIAN: Irine Seal, MD  DIAGNOSIS: 83 y.o. gentleman with stage T2a adenocarcinoma of the prostate with a Gleason's score of 3+4=7 and a PSA of 5.41    ICD-9-CM ICD-10-CM   1. Malignant neoplasm of prostate ([place]) Peter [name] Ambulatory referral to [place] [name] is a 83 y.o. gentleman.  He is followed in urology by Dr. Jeffie Pollock, as recently as 03/29/16 and 05/16/16 with pelvic/perineal burning/dysuria. PSA on 05/16/16 was 5.41.   Pt underwent MRI of prostate on 05/28/16 revealing 78mm x 25mm lesion within the left mid gland. This was concerning for prostate carcinoma (PIRADS 4).  He presented for biopsy on 07/05/16 given rising PSA on finasteride and MRI results. Rectal exam was unremarkable. The patient proceeded to transrectal ultrasound with 12 biopsies of the prostate on 07/05/16.  The prostate volume measured 44 cc.  Out of 12 core biopsies,5 were positive.  The maximum Gleason score was 3+4=7, and this was seen in left base lateral, left base, and left mid. Gleason 3+4=7 was also found in 3 ROI MRI lesion samples  PSA trend: 5.41 on 3/18 4.33 on 3/17 4.46 on 12/16 4.33 on 5/16  The patient reviewed the biopsy results with his urologist and he has kindly been referred today for discussion of potential radiation treatment options.  The patient expresses concern for fatigue given his friend's reports of inability to get out of car by himself due to radiation therapy. He does state that his "stroke doctor and heart attack doctor" have cleared him for surgical intervention.   On review of systems, patient endorses history of chronic floor pain that has dissipated more recently. Pt reports perineal burning since  biopsy and burning after urination. He endorses pain "right behind the testicles." He endorses some hematuria. Pt also endorses constipation. Pt states "I don't want to hurt like this anymore." He also endorses low back pain.  PREVIOUS RADIATION THERAPY: No  PAST MEDICAL HISTORY:  has a past medical history of Anxiety; Arthritis; CAD (coronary artery disease); Cataract; Chronic back pain; History of nuclear stress test; HLD (hyperlipidemia); Hypertension; Loss of balance; Prostate cancer ([facility] [name]); Prostate cancer screening; Prostate disease; Reflux; Sepsis [facility]) (February 2016); Shoulder pain; Skin cancer; and Stroke ([place]) (09/08/2015).    PAST SURGICAL HISTORY: Past Surgical History:  Procedure Laterality Date  . APPENDECTOMY    . CARDIAC CATHETERIZATION N/A 05/31/2015   Procedure: Left Heart Cath and Coronary Angiography;  Surgeon: Jettie Booze, MD;  Location: [place] [facility];  Service: Cardiovascular;  Laterality: N/A;  . CARDIAC CATHETERIZATION N/A 05/31/2015   Procedure: Coronary Stent Intervention;  Surgeon: Jettie Booze, MD;  Location: [facility];  Service: Cardiovascular;  Laterality: N/A;  . COLON SURGERY    . HAND SURGERY Left   . VASECTOMY  2008    FAMILY HISTORY: family history includes ALS in his brother; Breast cancer in his daughter; Prostate cancer in his father; Stroke in his mother.  SOCIAL HISTORY:  reports that he quit smoking about 44 years ago. His smoking use included Cigarettes. He started smoking about 54 years ago. He has a 10.00 pack-year smoking history. He has never used smokeless tobacco. He reports that he drinks about  0.6 oz of alcohol per week . He reports that he does not use drugs.  ALLERGIES: Bactrim [sulfamethoxazole-trimethoprim]; Cardura [doxazosin mesylate]; Doxycycline; Ezetimibe; Flomax [tamsulosin hcl]; Levaquin [levofloxacin in d5w]; Lipitor [atorvastatin]; Lisinopril; Niaspan [niacin]; Penicillins; Sulfa antibiotics; Vytorin  [ezetimibe-simvastatin]; Clindamycin/lincomycin; and Zithromax [azithromycin]  MEDICATIONS:  Current Outpatient Prescriptions  Medication Sig Dispense Refill  . acetaminophen (TYLENOL) 500 MG tablet Take 1,300 mg by mouth at bedtime.     Marland Kitchen alfuzosin (UROXATRAL) 10 MG 24 hr tablet Take 10 mg by mouth daily with breakfast.    . clopidogrel (PLAVIX) 75 MG tablet TAKE 1 TABLET (75 MG TOTAL) BY MOUTH DAILY. 30 tablet 10  . ezetimibe (ZETIA) 10 MG tablet Take 1 tablet (10 mg total) by mouth daily. 90 tablet 3  . Fluocinolone Acetonide 0.01 % OIL PLACE 3 DROPS INTO AFFECTED EAR(S) TWICE DAILY AS DIRECTED    . fluticasone (FLONASE) 50 MCG/ACT nasal spray Place 1 spray into the nose daily as needed for allergies.     . nitroGLYCERIN (NITROSTAT) 0.4 MG SL tablet Place 1 tablet (0.4 mg total) under the tongue every 5 (five) minutes x 3 doses as needed for chest pain. 25 tablet 3  . Polyethyl Glycol-Propyl Glycol (SYSTANE) 0.4-0.3 % GEL ophthalmic gel Place 1 application into both eyes 2 (two) times daily as needed.    . polyethylene glycol (MIRALAX) packet Take 17 g by mouth daily as needed for moderate constipation.     Marland Kitchen aspirin EC 81 MG tablet Take 81 mg by mouth daily.    . cephALEXin (KEFLEX) 500 MG capsule Take 500 mg by mouth 3 (three) times daily.  0  . diazepam (VALIUM) 10 MG tablet Take 1-2 tablets by mouth as needed. Take 1 hour before procedure  0  . finasteride (PROSCAR) 5 MG tablet Take 5 mg by mouth daily.    . meclizine (ANTIVERT) 25 MG tablet Take 1 tablet by mouth every 8 (eight) hours as needed.    . valsartan (DIOVAN) 40 MG tablet Take 1 tablet (40 mg total) by mouth daily. (Patient not taking: Reported on 07/25/2016) 30 tablet 0   No current facility-administered medications for this encounter.     REVIEW OF SYSTEMS:  A 15 point review of systems is documented in the electronic medical record. This was obtained by the nursing staff. However, I reviewed this with the patient to  discuss relevant findings and make appropriate changes.  Pertinent items are noted in HPI..  The patient completed an IPSS and IIEF questionnaire.  His IPSS score was 17 indicating moderate urinary outflow obstructive symptoms.  He indicated that his erectile function is unable to complete sexual activity on most attempts.   PHYSICAL EXAM: This patient is in no acute distress.  He is alert and oriented.   height is 5' 11.5" (1.816 m) and weight is 207 lb (93.9 kg). His blood pressure is 110/63 and his pulse is 76. His respiration is 16 and oxygen saturation is 100%.  He exhibits no respiratory distress or labored breathing.  He appears neurologically intact.  His mood is pleasant.  His affect is appropriate.  Please note the digital rectal exam findings described above.  KPS = 90  100 - Normal; no complaints; no evidence of disease. 90   - Able to carry on normal activity; minor signs or symptoms of disease. 80   - Normal activity with effort; some signs or symptoms of disease. 61   - Cares for self; unable to carry on  normal activity or to do active work. 60   - Requires occasional assistance, but is able to care for most of his personal needs. 50   - Requires considerable assistance and frequent medical care. 27   - Disabled; requires special care and assistance. 68   - Severely disabled; hospital admission is indicated although death not imminent. 90   - Very sick; hospital admission necessary; active supportive treatment necessary. 10   - Moribund; fatal processes progressing rapidly. 0     - Dead  Karnofsky DA, Abelmann Bradford, Craver LS and Burchenal [facility] [phone]) The use of the nitrogen mustards in the palliative treatment of carcinoma: with particular reference to bronchogenic carcinoma Cancer 1 [phone]   LABORATORY DATA:  Lab Results  Component Value Date   WBC 8.8 02/17/2016   HGB 14.1 02/17/2016   HCT 42.4 02/17/2016   MCV 92.6 02/17/2016   PLT 213 02/17/2016   Lab Results  Component  Value Date   NA 137 02/17/2016   K 4.4 02/17/2016   CL 106 02/17/2016   CO2 21 (L) 02/17/2016   Lab Results  Component Value Date   ALT 15 06/01/2016   AST 16 06/01/2016   ALKPHOS 51 06/01/2016   BILITOT 1.0 06/01/2016     RADIOGRAPHY: No results found.    IMPRESSION: This gentleman is a 83 y.o. caucasian male with stage T2a adenocarcinoma of the prostate with a Gleason's score of 3+4=7 and a PSA of 5.41.  His T-Stage, Gleason's Score, and PSA put him into the intermediate risk group.  Discerning appropriate treatment is complicated by history of pelvic and prostate pain due possibly to chronic prostatitis vs scarring from workplace accident vs neuropathy related to disc disease in spine. External beam radiation seems more appropriate than brachytherapy given its less traumatic impact on treated area. Postoperative risks are elevated due to patient's age.  PLAN: Today, I talked to the patient and family about the findings and work-up thus far.  We discussed the natural history of prostate cancer and general treatment, highlighting the role of radiotherapy in the management.  We discussed the available radiation techniques, and focused on the details of logistics and delivery.  We reviewed the anticipated acute and late sequelae associated with radiation in this setting.  The patient was encouraged to ask questions that I answered to the best of my ability.    Patient is scheduled for f/u with Dr. Jeffie Pollock next week. He will get back to me regarding his decision for prostatectomy vs external beam radiation.  I spent 60 minutes minutes face to face with the patient and more than 50% of that time was spent in counseling and/or coordination of care.   ------------------------------------------------  Sheral Apley. Tammi Klippel, M.D.  This document serves as a record of services personally performed by Tyler Pita, MD. It was created on his behalf by Linward Natal, a trained medical scribe. The  creation of this record is based on the scribe's personal observations and the provider's statements to them. This document has been checked and approved by the attending provider.

## 2016-07-26 ENCOUNTER — Telehealth: Payer: Self-pay | Admitting: Medical Oncology

## 2016-07-26 NOTE — Progress Notes (Signed)
Left a message to introduce myself and role as the prostate oncology nurse navigator. I was unable to meet Mr. Eischeid during his consult with Dr. Tammi Klippel.I asked him to call me with questions and or concerns.

## 2016-07-28 ENCOUNTER — Emergency Department (HOSPITAL_COMMUNITY): Payer: Medicare Other

## 2016-07-28 ENCOUNTER — Encounter (HOSPITAL_COMMUNITY): Payer: Self-pay | Admitting: Emergency Medicine

## 2016-07-28 ENCOUNTER — Emergency Department (HOSPITAL_COMMUNITY)
Admission: EM | Admit: 2016-07-28 | Discharge: 2016-07-28 | Disposition: A | Discharge status: Home / Self Care | Payer: Medicare Other | Attending: Physician Assistant | Admitting: Physician Assistant

## 2016-07-28 DIAGNOSIS — I10 Essential (primary) hypertension: Secondary | ICD-10-CM | POA: Diagnosis not present

## 2016-07-28 DIAGNOSIS — Z79899 Other long term (current) drug therapy: Secondary | ICD-10-CM | POA: Insufficient documentation

## 2016-07-28 DIAGNOSIS — Z8673 Personal history of transient ischemic attack (TIA), and cerebral infarction without residual deficits: Secondary | ICD-10-CM | POA: Diagnosis not present

## 2016-07-28 DIAGNOSIS — I252 Old myocardial infarction: Secondary | ICD-10-CM | POA: Insufficient documentation

## 2016-07-28 DIAGNOSIS — I2510 Atherosclerotic heart disease of native coronary artery without angina pectoris: Secondary | ICD-10-CM | POA: Insufficient documentation

## 2016-07-28 DIAGNOSIS — R200 Anesthesia of skin: Secondary | ICD-10-CM | POA: Diagnosis not present

## 2016-07-28 DIAGNOSIS — Z85828 Personal history of other malignant neoplasm of skin: Secondary | ICD-10-CM | POA: Diagnosis not present

## 2016-07-28 DIAGNOSIS — Z8546 Personal history of malignant neoplasm of prostate: Secondary | ICD-10-CM | POA: Diagnosis not present

## 2016-07-28 LAB — URINALYSIS, ROUTINE W REFLEX MICROSCOPIC
Bilirubin Urine: NEGATIVE
GLUCOSE, UA: NEGATIVE mg/dL
HGB URINE DIPSTICK: NEGATIVE
KETONES UR: NEGATIVE mg/dL
Leukocytes, UA: NEGATIVE
Nitrite: NEGATIVE
Protein, ur: NEGATIVE mg/dL
Specific Gravity, Urine: 1.012 (ref 1.005–1.030)
pH: 6 (ref 5.0–8.0)

## 2016-07-28 LAB — I-STAT TROPONIN, ED: Troponin i, poc: 0 ng/mL (ref 0.00–0.08)

## 2016-07-28 LAB — BASIC METABOLIC PANEL
Anion gap: 7 (ref 5–15)
BUN: 12 mg/dL (ref 6–20)
CHLORIDE: 107 mmol/L (ref 101–111)
CO2: 23 mmol/L (ref 22–32)
CREATININE: 0.98 mg/dL (ref 0.61–1.24)
Calcium: 8.9 mg/dL (ref 8.9–10.3)
GFR calc Af Amer: >60 mL/min (ref >60)
GFR calc non Af Amer: >60 mL/min (ref >60)
Glucose, Bld: 115 mg/dL — ABNORMAL HIGH (ref 65–99)
Potassium: 3.9 mmol/L (ref 3.5–5.1)
Sodium: 137 mmol/L (ref 135–145)

## 2016-07-28 LAB — CBC
HCT: 43.7 % (ref 39.0–52.0)
Hemoglobin: 14.9 g/dL (ref 13.0–17.0)
MCH: 31.6 pg (ref 26.0–34.0)
MCHC: 34.1 g/dL (ref 30.0–36.0)
MCV: 92.6 fL (ref 78.0–100.0)
Platelets: 244 10*3/uL (ref 150–400)
RBC: 4.72 MIL/uL (ref 4.22–5.81)
RDW: 12.3 % (ref 11.5–15.5)
WBC: 7.3 10*3/uL (ref 4.0–10.5)

## 2016-07-28 LAB — CBG MONITORING, ED: Glucose-Capillary: 93 mg/dL (ref 65–99)

## 2016-07-28 NOTE — ED Notes (Signed)
Pt is in stable condition upon d/c and ambulates from ED. 

## 2016-07-28 NOTE — ED Triage Notes (Signed)
Pt c/o left sided numbness onset yesterday. Pt reports gait issues for the past several days. Pt has history of intermittent numbness, reports he sleeps in a recliner and when he wakes up he gets the numbness on the left side. Pt has history of stroke. Speech clear, no facial droop. Pt also c/o pain in prostate ( history of prostate Cancer) and just completed 2nd course of antibiotics today.

## 2016-07-28 NOTE — Discharge Instructions (Signed)
You have numbness in your left arm and right hand. You declined my of your MRI of your C-spine. We will treat this is a peripheral neuropathy. You should follow-up with your primary care early this week. Please return with any concerns.Peter [name]

## 2016-07-28 NOTE — ED Notes (Signed)
Pt. Returned from MRI 

## 2016-07-28 NOTE — ED Provider Notes (Signed)
[facility] DEPT Provider Note   CSN: [id number] Arrival date & time: 07/28/16  1143     History   Chief Complaint Chief Complaint  Patient presents with  . Numbness    HPI Peter [name] is a 83 y.o. male.  HPI   Patient is an 83 year old male past medical history significant for anxiety, prostate disease prostate cancer, MI, CVA. Patient presenting with numbness to the left hand and the right hand. Patient reports he occasionally has numbness of the left hand arm secondary to how he lies. However reports that this numbness did not go completely away and he also has on the right hand as well. Patient point concern for stroke. This started last night. Nothing has made it etter.  Past Medical History:  Diagnosis Date  . Anxiety   . Arthritis   . CAD (coronary artery disease)    a. 05/2015: STEMI: 99% stenosis of Posterolateral w/ Synergy DES placed.  . Cataract   . Chronic back pain   . History of nuclear stress test    ETT-Myoview 5/17: EF 54%, inf scar, no ischemia; Low Risk  . HLD (hyperlipidemia)   . Hypertension   . Loss of balance   . Prostate cancer ([place])   . Prostate cancer screening    "for several years"  . Prostate disease   . Reflux   . Sepsis [facility]) February 2016   Pt stated he was treated at [facility] for this   . Shoulder pain   . Skin cancer   . Stroke [facility]) 09/08/2015    Patient Active Problem List   Diagnosis Date Noted  . Cerebral infarction ([place]) 09/09/2015  . HLD (hyperlipidemia)   . CAD (coronary artery disease)   . Acute CVA (cerebrovascular accident) ([place])   . ST elevation myocardial infarction (STEMI) of inferior wall ([place]) 05/31/2015  . ST elevation (STEMI) myocardial infarction involving other coronary artery of inferior wall ([place]) 05/31/2015  . Malignant neoplasm of prostate ([place])   . Hyperlipidemia   . Chronic pain of both knees 05/22/2015  . Constipation 04/23/2014  . Sepsis ([place]) 04/22/2014  . Abdominal pain 04/22/2014    . Essential hypertension 04/22/2014  . Subacute ethmoidal sinusitis   . Lactic acid increased   . Leucocytosis   . Cough 04/21/2014  . Paresthesia 02/08/2014  . CTS (carpal tunnel syndrome) 12/01/2012  . Memory changes 11/14/2012  . Shoulder pain   . Reflux   . Chronic back pain   . Loss of balance   . Dyspnea 09/14/2012  . HTN (hypertension) 09/14/2012  . Abnormal CT of the chest 09/14/2012  . Gastro-esophageal reflux disease with esophagitis 04/30/2012  . Shingles 11/10/2011  . BPH (benign prostatic hyperplasia) 07/12/2011  . Seasonal allergies 07/12/2011    Past Surgical History:  Procedure Laterality Date  . APPENDECTOMY    . CARDIAC CATHETERIZATION N/A 05/31/2015   Procedure: Left Heart Cath and Coronary Angiography;  Surgeon: Jettie Booze, MD;  Location: [facility];  Service: Cardiovascular;  Laterality: N/A;  . CARDIAC CATHETERIZATION N/A 05/31/2015   Procedure: Coronary Stent Intervention;  Surgeon: Jettie Booze, MD;  Location: [facility];  Service: Cardiovascular;  Laterality: N/A;  . COLON SURGERY    . HAND SURGERY Left   . VASECTOMY  2008       Home Medications    Prior to Admission medications   Medication Sig Start Date End Date Taking? Authorizing Provider  acetaminophen (TYLENOL) 650 MG  CR tablet Take 650 mg by mouth every 8 (eight) hours as needed for pain.   Yes [provider]  alfuzosin (UROXATRAL) 10 MG 24 hr tablet Take 10 mg by mouth daily with breakfast.   Yes [provider]  cephALEXin (KEFLEX) 500 MG capsule Take 500 mg by mouth 4 (four) times daily. 07/20/16  Yes [provider]  clopidogrel (PLAVIX) 75 MG tablet TAKE 1 TABLET (75 MG TOTAL) BY MOUTH DAILY. 07/24/16  Yes Jettie Booze, MD  ezetimibe (ZETIA) 10 MG tablet Take 1 tablet (10 mg total) by mouth daily. 06/05/16 09/03/16 Yes Jettie Booze, MD  Fluocinolone Acetonide 0.01 % OIL PLACE 3 DROPS INTO AFFECTED EAR(S) TWICE DAILY  AS DIRECTED AS NEEDED 11/17/15  Yes [provider]  fluticasone (FLONASE) 50 MCG/ACT nasal spray Place 1 spray into the nose daily as needed for allergies.  01/28/13  Yes [provider]  nitroGLYCERIN (NITROSTAT) 0.4 MG SL tablet Place 1 tablet (0.4 mg total) under the tongue every 5 (five) minutes x 3 doses as needed for chest pain. 06/01/15  Yes Strader, [place] M, PA-C  Polyethyl Glycol-Propyl Glycol (SYSTANE) 0.4-0.3 % GEL ophthalmic gel Place 1 application into both eyes 2 (two) times daily as needed.   Yes [provider]  polyethylene glycol (MIRALAX) packet Take 17 g by mouth daily as needed for moderate constipation.    Yes [provider]  Probiotic Product (PROBIOTIC PO) Take 1 tablet by mouth daily.   Yes [provider]  valsartan (DIOVAN) 40 MG tablet Take 1 tablet (40 mg total) by mouth daily. 09/13/15  Yes TatShanon Brow, MD    Family History Family History  Problem Relation Age of Onset  . Stroke Mother   . Prostate cancer Father   . ALS Brother   . Breast cancer Daughter     Social History Social History  Substance Use Topics  . Smoking status: Former Smoker    Packs/day: 1.00    Years: 10.00    Types: Cigarettes    Start date: 03/05/1962    Quit date: 03/05/1972  . Smokeless tobacco: Never Used  . Alcohol use 0.6 oz/week    1 Cans of beer per week     Comment: Once a month     Allergies   Bactrim [sulfamethoxazole-trimethoprim]; Cardura [doxazosin mesylate]; Doxycycline; Ezetimibe; Flomax [tamsulosin hcl]; Levaquin [levofloxacin in d5w]; Lipitor [atorvastatin]; Penicillins; Sulfa antibiotics; Vytorin [ezetimibe-simvastatin]; Lisinopril; Niaspan [niacin]; Clindamycin/lincomycin; and Zithromax [azithromycin]   Review of Systems Review of Systems  Constitutional: Negative for fatigue and fever.  Respiratory: Negative for chest tightness and shortness of breath.   Musculoskeletal: Positive for gait problem.  Neurological:  Positive for numbness. Negative for speech difficulty, weakness and headaches.     Physical Exam Updated Vital Signs BP (!) 166/97   Pulse (!) 57   Temp 97.9 F (36.6 C) (Oral)   Resp 17   Ht 5' 11.5" (1.816 m)   Wt 93.9 kg (207 lb)   SpO2 97%   BMI 28.47 kg/m   Physical Exam  Constitutional: He is oriented to person, place, and time. He appears well-developed and well-nourished.  HENT:  Head: Normocephalic and atraumatic.  Right Ear: Tympanic membrane normal.  Left Ear: Tympanic membrane normal.  Eyes: Conjunctivae are normal.  Neck: Neck supple.  Cardiovascular: Normal rate and regular rhythm.   No murmur heard. Pulmonary/Chest: Effort normal and breath sounds normal. No respiratory distress.  Abdominal: Soft. There is no tenderness.  Musculoskeletal:  He exhibits no edema.  Neurological: He is alert and oriented to person, place, and time.  Equal strength bilaterally upper and lower extremities negative pronator drift. Normal sensation bilaterally. Speech comprehensible, no slurring. Facial nerve tested and appears grossly normal. Alert and oriented 3.   Skin: Skin is warm and dry. He is not diaphoretic.  Psychiatric: He has a normal mood and affect. His behavior is normal.  Nursing note and vitals reviewed.    ED Treatments / Results  Labs (all labs ordered are listed, but only abnormal results are displayed) Labs Reviewed  BASIC METABOLIC PANEL - Abnormal; Notable for the following:       Result Value   Glucose, Bld 115 (*)    All other components within normal limits  CBC  URINALYSIS, ROUTINE W REFLEX MICROSCOPIC  CBG MONITORING, ED  Randolm Idol, ED    EKG  EKG Interpretation None       Radiology Mr Brain Wo Contrast  Result Date: 07/28/2016 CLINICAL DATA:  Numbness in both hands.  History of stroke. EXAM: MRI HEAD WITHOUT CONTRAST TECHNIQUE: Multiplanar, multiecho pulse sequences of the brain and surrounding structures were obtained without  intravenous contrast. COMPARISON:  09/09/2015 FINDINGS: Brain: There is no evidence of acute infarct, intracranial hemorrhage, mass, midline shift, or extra-axial fluid collection. The ventricles and sulci are normal for age. Scattered punctate foci of T2 hyperintensity in the cerebral white matter are similar to the prior study and within normal limits for age. No appreciable gliosis/encephalomalacia is seen in the pons at the site of the acute infarct on the prior study. Vascular: Major intracranial vascular flow voids are preserved. Skull and upper cervical spine: Unremarkable bone marrow signal. Sinuses/Orbits: Unremarkable orbits. Mild mucosal thickening in the frontal and ethmoid sinuses. Trace bilateral mastoid fluid. Other: None. IMPRESSION: 1. Unremarkable appearance of the brain for age. No acute abnormality. 2. No discernible gliosis or other residual signal abnormality at the site of the prior pontine infarct. Electronically Signed   By: Logan Bores M.D.   On: 07/28/2016 15:38    Procedures Procedures (including critical care time)  Medications Ordered in ED Medications - No data to display   Initial Impression / Assessment and Plan / ED Course  I have reviewed the triage vital signs and the nursing notes.  Pertinent labs & imaging results that were available during my care of the patient were reviewed by me and considered in my medical decision making (see chart for details).     Patient's 83 year old male history of prostate cancer MI and stroke and anxiety. Presenting today with numbness to the left and right arm. Patient reports that on the dorsal side of his left hand and forearm. And on the dorsal aspect of his right hand. Patient has history of positional numbness but this feels different. Patient also notes she is been a little unsteady in his gait recently. Of note patient is to be on Plavix and aspirin. He was taken off of his Plavix for 7 days while he had a prostate biopsy  earlier this month. Patient was never restarted back on aspirin secondary to a decision by his cardiologist per patient..  Along eith the gait trouble he has had dizziness, diagnosed at [facility]. Also recent UA (yesterday) come back clean per pateint.   Recent change in anticoagulation and the residual numbness. I'm concerned about a stroke. Versus disc in his neck. We'll get MRIs. If normal will treat as peripheral peripheral neuropathy and have patient follow-up with  his physicians.  4:35 PM Patient did do MRI of his brain which is negative. Patient refused MRI of his cervical spine. Therefore we will treat this as a peripheral neuropathy. Patient was informed that he is welcome to come back for the MRI of the spine at any time. He does have good strength. I think this he should follow-up with his primary care, urologist, and other physicians as an outpatient.  Final Clinical Impressions(s) / ED Diagnoses   Final diagnoses:  Numbness    New Prescriptions New Prescriptions   No medications on file     Macarthur Critchley, MD 07/28/16 1635

## 2016-07-28 NOTE — ED Notes (Signed)
Patient transported to MRI 

## 2016-08-08 ENCOUNTER — Telehealth: Payer: Self-pay | Admitting: Interventional Cardiology

## 2016-08-08 NOTE — Telephone Encounter (Signed)
If Tylenol is not helping, he can try using ibuprofen (would have him limit use if possible d/t increase in bleed risk with him also taking Plavix). If his cancer pain persists, he may need a stronger prescription from his PCP for something like tramadol.

## 2016-08-08 NOTE — Telephone Encounter (Signed)
Patient made aware of Fuller Canada, RPH's recommendations that if Tylenol is not helping, that he can try using ibuprofen. Patient instructed to limit the use due to the increase in bleeding risk with him also taking Plavix. Patient advised that if his cancer pain persists, that he may need a stronger prescription from his PCP for something like tramadol. Patient verbalized understanding and thanked me for the call.

## 2016-08-08 NOTE — Telephone Encounter (Signed)
New Message     Pt was wanting to know if you can recommend a pain medication that will not affect his bp, he was diagnosed with prostate cancer and he is having a lot of pain and tylenol is not helping.   It is okay to leave message on voicemail

## 2016-08-10 ENCOUNTER — Telehealth: Payer: Self-pay | Admitting: Medical Oncology

## 2016-08-10 NOTE — Progress Notes (Deleted)
Peter [name] called stating he had a follow up appointment with Dr. Jeffie Pollock 08/01/16 to further discuss treatment options for his prostate cancer. He currently is going to continue active surveillance and will follow up with Dr. Jeffie Pollock in September with PSA. He will contact us after his September visit if he chooses to move forward with radiation. I will notify Dr. Tammi Klippel of the above.

## 2016-08-10 NOTE — Progress Notes (Signed)
Mr. Blucher called stating he had a follow up appointment with Dr. Jeffie Pollock 08/01/16 to further discuss treatment options for his prostate cancer. He currently is going to continue active surveillance and will follow up with Dr. Jeffie Pollock in September with PSA.

## 2016-08-10 NOTE — Telephone Encounter (Deleted)
Peter [name] called stating he had a follow up appointment with Dr. Jeffie Pollock 08/01/16 to further discuss treatment options for his prostate cancer. He currently is going to continue active surveillance and will follow up with Dr. Jeffie Pollock in September with PSA. He will contact us after his September visit if he chooses to move forward with radiation. I will notify Dr. Tammi Klippel of the above.

## 2016-08-10 NOTE — Telephone Encounter (Signed)
Peter [name] called stating he had an appointment with Dr. Jeffie Pollock 08/01/16 to further discuss treatment options for his prostate treatment. He is going to continue active surveillance and follow up with Dr. Jeffie Pollock in September with PSA. He will contact us in September if he decides to move forward with radiation. I will forward this note  to Dr. Tammi Klippel.

## 2016-08-15 ENCOUNTER — Encounter: Payer: Self-pay | Admitting: *Deleted

## 2016-08-15 NOTE — Progress Notes (Signed)
[facility] Psychosocial Distress Screening Clinical Social Work  Clinical Social Work was referred by distress screening protocol.  The patient scored a 8 on the Psychosocial Distress Thermometer which indicates severe distress. Clinical Social Worker reviewed chart to assess for distress and other psychosocial needs. Pt had his physical concerns addressed at his appointment. Per chart review, he has decided to not pursue radiation at this time. CSW will be available as needed, if pt returns to [facility] to seek care.   ONCBCN DISTRESS SCREENING 07/25/2016  Screening Type Initial Screening  Distress experienced in past week (1-10) 8  Emotional problem type Nervousness/Anxiety;Adjusting to illness  Information Concerns Type Lack of info about complementary therapy choices;Lack of info about treatment;Lack of info about maintaining fitness  Physical Problem type Pain;Sleep/insomnia;Constipation/diarrhea;Changes in urination;Tingling hands/feet;Skin dry/itchy  Physician notified of physical symptoms Yes  Referral to clinical psychology No  Referral to clinical social work Yes  Referral to dietition No  Referral to financial advocate No  Referral to support programs No  Referral to palliative care No   Clinical Social Worker follow up needed: No.  If yes, follow up plan:  Loren Racer, Marlinda Mike, OSW-C Clinical Social Worker [place]  [facility] Phone: [phone] Fax: [phone]

## 2016-08-28 ENCOUNTER — Telehealth: Payer: Self-pay | Admitting: Interventional Cardiology

## 2016-08-28 DIAGNOSIS — E7800 Pure hypercholesterolemia, unspecified: Secondary | ICD-10-CM

## 2016-08-28 DIAGNOSIS — I2510 Atherosclerotic heart disease of native coronary artery without angina pectoris: Secondary | ICD-10-CM

## 2016-08-28 NOTE — Telephone Encounter (Signed)
Patient calling to schedule follow-up appt and wanted to verify if he needs to have lab work completed. Thanks.

## 2016-09-04 NOTE — Telephone Encounter (Signed)
Patient made aware that he will need CMET and Fasting LIPIDS. Labs ordered and appointment made for 12/04/16. Patient verbalized understanding and thanked me for the call.

## 2016-09-04 NOTE — Telephone Encounter (Signed)
CMet and lipids

## 2016-10-13 IMAGING — DX DG CHEST 2V
2 series · 2 of 2 positions shown · non-contrast
Comparison: 04/21/2014

CLINICAL DATA: Stroke, history hypertension, coronary artery
disease, former smoker

EXAM:
CHEST  2 VIEW

[w chest pa]
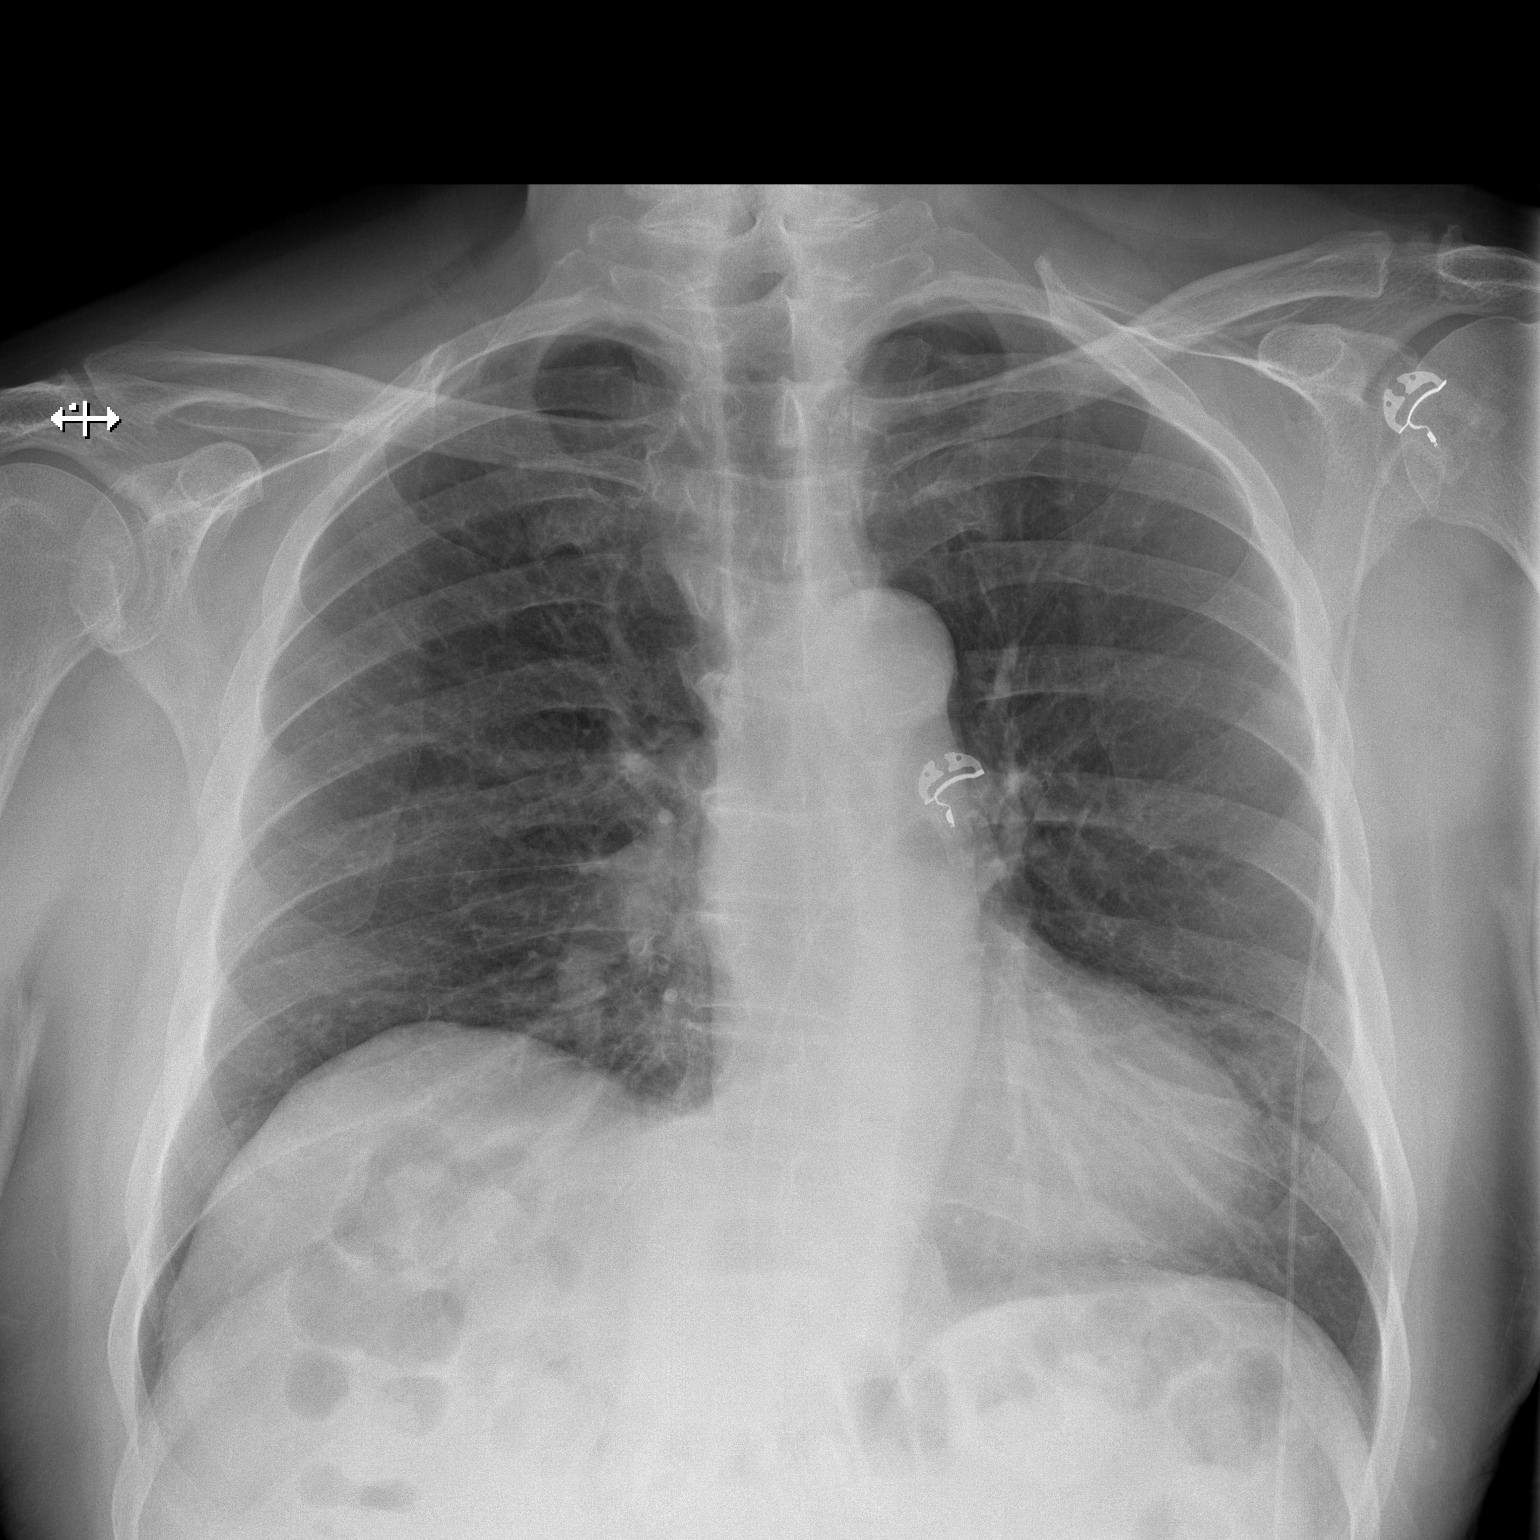

[w chest lat]
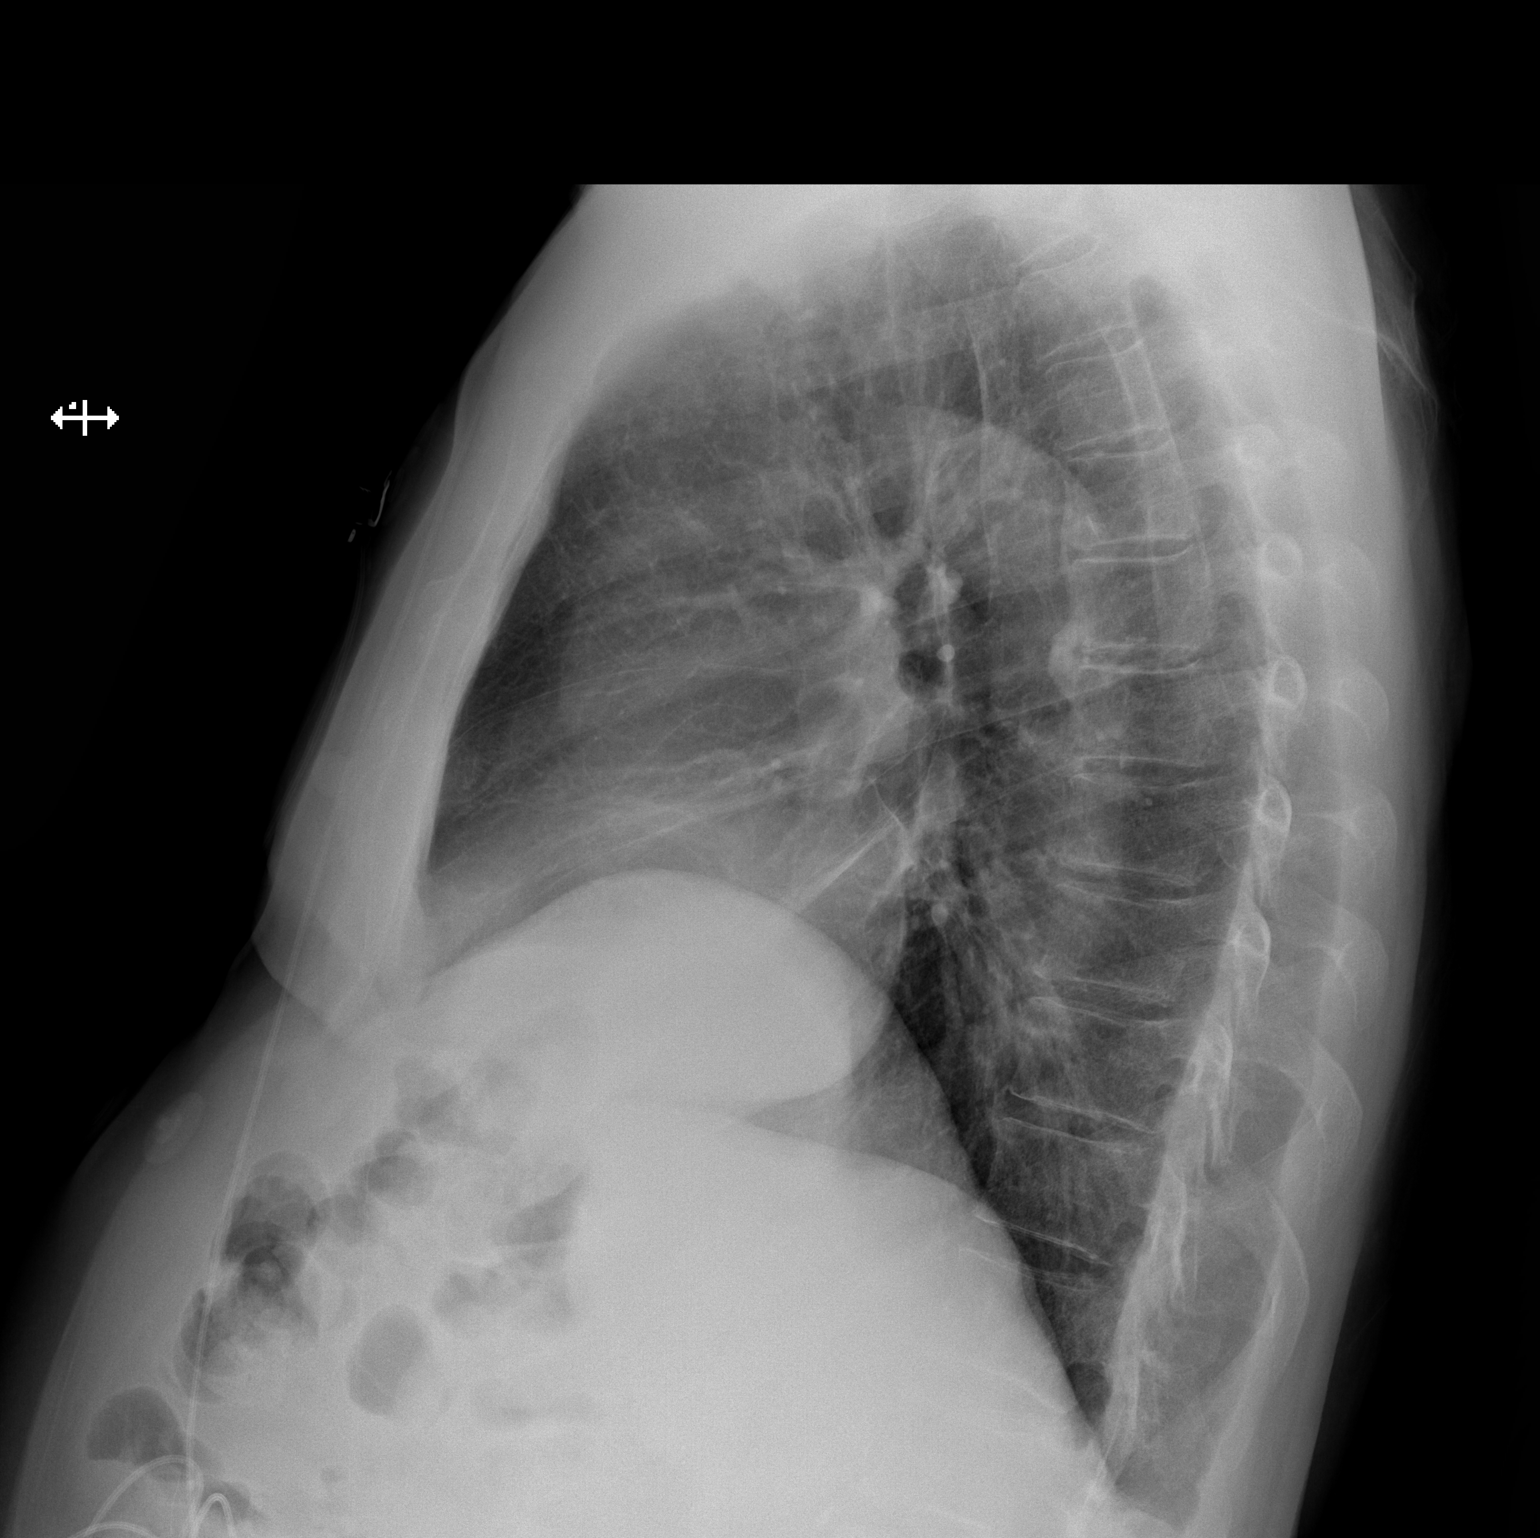

[2 of 2 positions shown; findings below may reference images not displayed]

FINDINGS: Normal heart size, mediastinal contours, and pulmonary vascularity.

Atherosclerotic calcification aorta.

Chronic elevation RIGHT diaphragm.

Lungs otherwise clear.

No infiltrate, pleural effusion or pneumothorax.

Probable BILATERAL nipple shadows, seen on lateral view as well.

No acute osseous findings.
IMPRESSION: No acute abnormalities.

## 2016-12-04 ENCOUNTER — Other Ambulatory Visit: Payer: Medicare Other

## 2016-12-04 DIAGNOSIS — I2510 Atherosclerotic heart disease of native coronary artery without angina pectoris: Secondary | ICD-10-CM

## 2016-12-04 DIAGNOSIS — E7800 Pure hypercholesterolemia, unspecified: Secondary | ICD-10-CM

## 2016-12-04 LAB — LIPID PANEL
CHOL/HDL RATIO: 3.7 ratio (ref 0.0–5.0)
Cholesterol, Total: 141 mg/dL (ref 100–199)
HDL: 38 mg/dL — ABNORMAL LOW (ref >39)
LDL CALC: 90 mg/dL (ref 0–99)
Triglycerides: 64 mg/dL (ref 0–149)
VLDL Cholesterol Cal: 13 mg/dL (ref 5–40)

## 2016-12-07 NOTE — Progress Notes (Signed)
Cardiology Office Note   Date:  12/10/2016   ID:  Peter [name], DOB 07-May-1933, MRN [id number]  PCP:  Aura Dials, PA-C    No chief complaint on file. CAD/MI   Wt Readings from Last 3 Encounters:  12/10/16 213 lb 9.6 oz (96.9 kg)  07/28/16 207 lb (93.9 kg)  07/25/16 207 lb (93.9 kg)       History of Present Illness: Peter [name] is a 83 y.o. male  with a hx of HTN, HL, elevated PSA. Admitted in 3/17 with inferior STEMI. LHC demonstrated 99% stenosis in the posterior lateral artery treated with a Synergy DES. He had mild to moderate nonobstructive disease elsewhere. EF was preserved by echocardiogram. Given need for upcoming prostate biopsy, it was recommended that he remain on dual antiplatelet therapy for at least 3 months minimum. He had issues with statins in the past and was restarted on low dose Crestor only but has stopped this.  He had some chest pain, dyspnea after the MI in May 2017.  Different than his prior angina. Cardiolite in May 2017 showed some inferior scar but no ischemia.    His Crestor was stopped due to concerns about his memory in 2017.  After stoppping the Crestor, he initially felt better, but sx are now worse again.  Back pain especially is worse. He did not go on Crestor again, he started pravastatin.  He thinks the pravastatin may be responsible for all of his current problems including memory issues, weakness, and urinary retention.  He stopped pravastatin.    He had a stroke in July 2017.  He has recovered well from the stroke.    He had wisdom teeth pulled in Dec 2017.  He had some low BP after this when he was on pain killers in combination with his prostate med.  He was diagnosed with prostate cancer.  He is under active surveillance.  He has back pain.  If PSA increases, he would get hormone therapy.  He is not considering surgery at this time.  He has persistent GU pai, but had an accident many years ago at work in that  area.  Denies : Chest pain. Dizziness. Leg edema. Nitroglycerin use. Orthopnea. Palpitations. Paroxysmal nocturnal dyspnea. Shortness of breath. Syncope.   He plays some golf.  He tries to walk 30 minutes several times a week.  Back pain limits him.       Past Medical History:  Diagnosis Date  . Anxiety   . Arthritis   . CAD (coronary artery disease)    a. 05/2015: STEMI: 99% stenosis of Posterolateral w/ Synergy DES placed.  . Cataract   . Chronic back pain   . History of nuclear stress test    ETT-Myoview 5/17: EF 54%, inf scar, no ischemia; Low Risk  . HLD (hyperlipidemia)   . Hypertension   . Loss of balance   . Prostate cancer ([place])   . Prostate cancer screening    "for several years"  . Prostate disease   . Reflux   . Sepsis [facility]) February 2016   Pt stated he was treated at [facility] for this   . Shoulder pain   . Skin cancer   . Stroke [facility]) 09/08/2015    Past Surgical History:  Procedure Laterality Date  . APPENDECTOMY    . CARDIAC CATHETERIZATION N/A 05/31/2015   Procedure: Left Heart Cath and Coronary Angiography;  Surgeon: Jettie Booze, MD;  Location: [place]  CV LAB;  Service: Cardiovascular;  Laterality: N/A;  . CARDIAC CATHETERIZATION N/A 05/31/2015   Procedure: Coronary Stent Intervention;  Surgeon: Jettie Booze, MD;  Location: [place] CV LAB;  Service: Cardiovascular;  Laterality: N/A;  . COLON SURGERY    . HAND SURGERY Left   . VASECTOMY  2008     Current Outpatient Prescriptions  Medication Sig Dispense Refill  . acetaminophen (TYLENOL) 650 MG CR tablet Take 650 mg by mouth every 8 (eight) hours as needed for pain.    Marland Kitchen alfuzosin (UROXATRAL) 10 MG 24 hr tablet Take 10 mg by mouth daily with breakfast.    . clopidogrel (PLAVIX) 75 MG tablet TAKE 1 TABLET (75 MG TOTAL) BY MOUTH DAILY. 30 tablet 10  . ezetimibe (ZETIA) 10 MG tablet Take 10 mg by mouth daily.  1  . Fluocinolone Acetonide 0.01 % OIL PLACE 3 DROPS INTO AFFECTED  EAR(S) TWICE DAILY AS DIRECTED AS NEEDED    . fluticasone (FLONASE) 50 MCG/ACT nasal spray Place 1 spray into the nose daily as needed for allergies.     . nitroGLYCERIN (NITROSTAT) 0.4 MG SL tablet Place 1 tablet (0.4 mg total) under the tongue every 5 (five) minutes x 3 doses as needed for chest pain. 25 tablet 3  . Polyethyl Glycol-Propyl Glycol (SYSTANE) 0.4-0.3 % GEL ophthalmic gel Place 1 application into both eyes 2 (two) times daily as needed.    . polyethylene glycol (MIRALAX) packet Take 17 g by mouth daily as needed for moderate constipation.     . triamcinolone cream (KENALOG) 0.1 % Apply 1 application topically as needed.  0  . valsartan (DIOVAN) 40 MG tablet Take 1 tablet (40 mg total) by mouth daily. 30 tablet 0   No current facility-administered medications for this visit.     Allergies:   Bactrim [sulfamethoxazole-trimethoprim]; Cardura [doxazosin mesylate]; Doxycycline; Flomax [tamsulosin hcl]; Levaquin [levofloxacin in d5w]; Lipitor [atorvastatin]; Penicillins; Sulfa antibiotics; Vytorin [ezetimibe-simvastatin]; Lisinopril; Niaspan [niacin]; Clindamycin/lincomycin; and Zithromax [azithromycin]    Social History:  The patient  reports that he quit smoking about 44 years ago. His smoking use included Cigarettes. He started smoking about 54 years ago. He has a 10.00 pack-year smoking history. He has never used smokeless tobacco. He reports that he drinks about 0.6 oz of alcohol per week . He reports that he does not use drugs.   Family History:  The patient's family history includes ALS in his brother; Breast cancer in his daughter; Prostate cancer in his father; Stroke in his mother.    ROS:  Please see the history of present illness.   Otherwise, review of systems are positive for back pain.   All other systems are reviewed and negative.    PHYSICAL EXAM: VS:  BP 130/80   Pulse 69   Ht 5' 11.5" (1.816 m)   Wt 213 lb 9.6 oz (96.9 kg)   SpO2 93%   BMI 29.38 kg/m  , BMI  Body mass index is 29.38 kg/m. GEN: Well nourished, well developed, in no acute distress  HEENT: normal  Neck: no JVD, carotid bruits, or masses Cardiac: RRR; no murmurs, rubs, or gallops,no edema , palpable PT pulses bilaterally Respiratory:  clear to auscultation bilaterally, normal work of breathing GI: soft, nontender, nondistended, + BS MS: no deformity or atrophy  Skin: warm and dry, no rash Neuro:  Strength and sensation are intact Psych: euthymic mood, full affect    Recent Labs: 06/01/2016: ALT 15 07/28/2016: BUN 12; Creatinine, Ser 0.98;  Hemoglobin 14.9; Platelets 244; Potassium 3.9; Sodium 137   Lipid Panel    Component Value Date/Time   CHOL 141 12/04/2016 1049   TRIG 64 12/04/2016 1049   HDL 38 (L) 12/04/2016 1049   CHOLHDL 3.7 12/04/2016 1049   CHOLHDL 4.8 09/09/2015 1122   VLDL 20 09/09/2015 1122   LDLCALC 90 12/04/2016 1049     Other studies Reviewed: Additional studies/ records that were reviewed today with results demonstrating: labs as noted below.   ASSESSMENT AND PLAN:  1. CAD/MI: No angina.  Continue aggressive secondary prevention.  Less bruising since stopping aspirin. Continue clopidogrel.  Came off for prostate biopsy without problems.   2. Hyperlipidemia: Tolerating Zetia.  Was intolerant of statin.  He was concerned that memory issues were related to statins.  LDL 90 in 10/18, TG 64.   3. Memory disturbance:  Feels it is worse since his MI and stroke, but even worse in the last 6 months.  Forgets names of people he is talking to at this time.  Statin was stopped due to memory issues in the past. 4. HTN: On low dose ARB.  BP controlled.    Current medicines are reviewed at length with the patient today.  The patient concerns regarding his medicines were addressed.  The following changes have been made:  No change  Labs/ tests ordered today include:  No orders of the defined types were placed in this encounter.   Recommend 150 minutes/week of  aerobic exercise Low fat, low carb, high fiber diet recommended  Disposition:   FU in 1 year   Signed, Larae Grooms, MD  12/10/2016 9:21 AM    [facility] [place], [place], [place]  [address] Phone: [phone]; Fax: [phone]

## 2016-12-10 ENCOUNTER — Ambulatory Visit (INDEPENDENT_AMBULATORY_CARE_PROVIDER_SITE_OTHER): Payer: Medicare Other | Admitting: Interventional Cardiology

## 2016-12-10 ENCOUNTER — Encounter: Payer: Self-pay | Admitting: Interventional Cardiology

## 2016-12-10 VITALS — BP 130/80 | HR 69 | Ht 71.5 in | Wt 213.6 lb

## 2016-12-10 DIAGNOSIS — I10 Essential (primary) hypertension: Secondary | ICD-10-CM | POA: Diagnosis not present

## 2016-12-10 DIAGNOSIS — I25119 Atherosclerotic heart disease of native coronary artery with unspecified angina pectoris: Secondary | ICD-10-CM | POA: Diagnosis not present

## 2016-12-10 DIAGNOSIS — R413 Other amnesia: Secondary | ICD-10-CM

## 2016-12-10 DIAGNOSIS — E782 Mixed hyperlipidemia: Secondary | ICD-10-CM

## 2016-12-10 DIAGNOSIS — I209 Angina pectoris, unspecified: Secondary | ICD-10-CM | POA: Diagnosis not present

## 2016-12-10 NOTE — Patient Instructions (Signed)

## 2016-12-13 ENCOUNTER — Telehealth: Payer: Self-pay | Admitting: Urology

## 2016-12-13 NOTE — Telephone Encounter (Signed)
I spoke with Peter [name] today in follow-up regarding treatment of his prostate cancer.  He had a recent follow-up with Dr. Jeffie Pollock at the end of September and PSA at that point was noted to have very minimal increase per patient report to a level of 5.7, previous PSA from 05/2016 was 5.41.  He has been found to have a Gleason 7 prostate cancer but due to his age and slow rate of PSA rise, he has opted to proceed with active surveillance.  He has a follow-up scheduled with Dr. Jeffie Pollock in 6 months and will have a repeat PSA drawn at that time.  I advised him that we will continue to follow him through correspondence with [facility] and are happy to participate in his care in the future should he decide to proceed with definitive treatment.  I advised him that he is welcome to call at any time with any questions or concerns related to radiotherapy. Otherwise, we will await further correspondence from Dr. Jeffie Pollock and/or the patient.  He is comfortable with this plan.   Nicholos Johns, PA-C

## 2016-12-14 IMAGING — NM NM BONE WHOLE BODY
2 series · 2 of 2 positions shown · non-contrast
Comparison: None.

CLINICAL DATA: Elevated PSA level. Back and bilateral lower
extremity pain. No history of cancer.

EXAM:
NUCLEAR MEDICINE WHOLE BODY BONE SCAN
TECHNIQUE: Whole body anterior and posterior images were obtained approximately
3 hours after intravenous injection of radiopharmaceutical.
RADIOPHARMACEUTICALS:  27 mCi Oechnetium-KKm MDP IV

[Series 1: whole body · 2.66mm/px · 1 of 1 slices shown (1 of 2)]
[im 1/1]
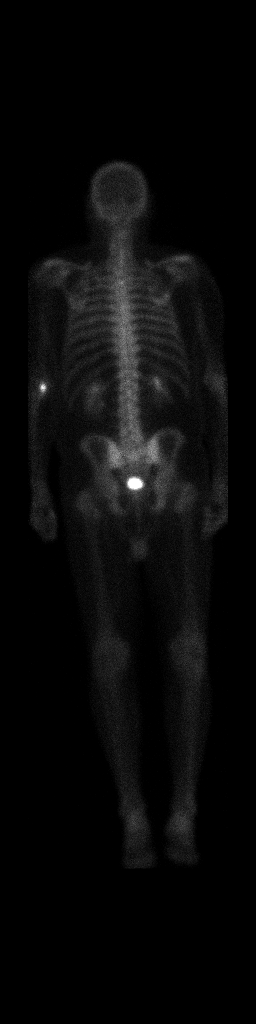

[Series 1: whole body · 2.66mm/px · 1 of 1 slices shown (2 of 2)]
[im 1/1]
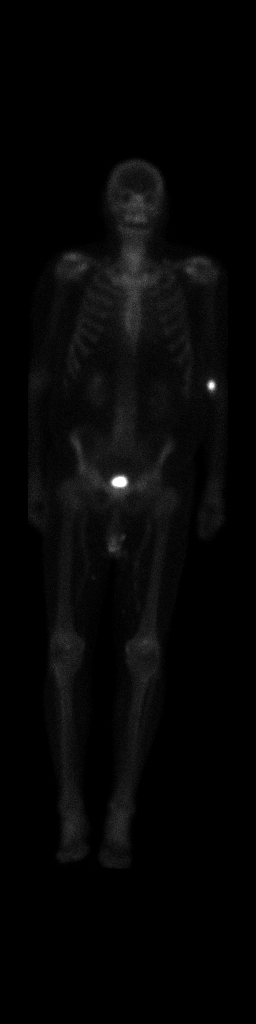

[2 of 2 positions shown; findings below may reference images not displayed]

FINDINGS: No abnormal uptake is noted. No focal lesions are noted. Normal
symmetric uptake is noted in the skeleton.
IMPRESSION: No abnormal uptake is noted.

## 2017-01-10 ENCOUNTER — Other Ambulatory Visit: Payer: Self-pay | Admitting: Physician Assistant

## 2017-05-29 ENCOUNTER — Other Ambulatory Visit: Payer: Self-pay | Admitting: Interventional Cardiology

## 2017-06-17 NOTE — Progress Notes (Signed)
Cardiology Office Note   Date:  06/18/2017   ID:  Peter [name], DOB 1934-01-23, MRN [id number]  PCP:  Aura Dials, PA-C    No chief complaint on file.  CAD  Wt Readings from Last 3 Encounters:  06/18/17 213 lb 12.8 oz (97 kg)  12/10/16 213 lb 9.6 oz (96.9 kg)  07/28/16 207 lb (93.9 kg)       History of Present Illness: Peter [name] is a 84 y.o. male  With h/o CAD/MI.    Prior record showed: " Admitted in 3/17 with inferior STEMI. LHC demonstrated 99% stenosis in the posterior lateral artery treated with a Synergy DES. He had mild to moderate nonobstructive disease elsewhere. EF was preserved by echocardiogram. Given need for upcoming prostate biopsy, it was recommended that he remain on dual antiplatelet therapy for at least 3 months minimum. He had issues with statins in the past and was restarted on low dose Crestor only but has stopped this.  He had some chest pain, dyspnea after the MI in May 2017. Different than his prior angina. Cardiolite in May 2017 showed some inferior scar but no ischemia.   His Crestor was stopped due to concerns about his memory in 2017. After stoppping the Crestor, he initially felt better, but sx are now worse again. Back pain especially is worse. He did not go on Crestor again, he started pravastatin. He thinks the pravastatin may be responsible for all of his current problems including memory issues, weakness, and urinary retention. He stopped pravastatin.   He had a stroke in July 2017. He has recovered well from the stroke.   He had wisdom teeth pulled in Dec 2017. He had some low BP after this when he was on pain killers in combination with his prostate med.  He was diagnosed with prostate cancer.  He is under active surveillance.  He has back pain.  If PSA increases, he would get hormone therapy.  He is not considering surgery at this time.  He has persistent GU pain, but had an accident many years ago at work  in that area."  Since the last visit, he has done well from a cardiac standpoint.  He is still active in the yard.  He has had some orthopedic issues and was told he had an L5 disc issue.  He has had some muscle pain in his leg.  This has improved.    Denies : Chest pain. Dizziness. Leg edema. Nitroglycerin use. Orthopnea. Palpitations. Paroxysmal nocturnal dyspnea. Shortness of breath. Syncope.      Past Medical History:  Diagnosis Date  . Anxiety   . Arthritis   . CAD (coronary artery disease)    a. 05/2015: STEMI: 99% stenosis of Posterolateral w/ Synergy DES placed.  . Cataract   . Chronic back pain   . History of nuclear stress test    ETT-Myoview 5/17: EF 54%, inf scar, no ischemia; Low Risk  . HLD (hyperlipidemia)   . Hypertension   . Loss of balance   . Prostate cancer ([place])   . Prostate cancer screening    "for several years"  . Prostate disease   . Reflux   . Sepsis [facility]) February 2016   Pt stated he was treated at [facility] for this   . Shoulder pain   . Skin cancer   . Stroke [facility]) 09/08/2015    Past Surgical History:  Procedure Laterality Date  . APPENDECTOMY    .  CARDIAC CATHETERIZATION N/A 05/31/2015   Procedure: Left Heart Cath and Coronary Angiography;  Surgeon: Jettie Booze, MD;  Location: [place] CV LAB;  Service: Cardiovascular;  Laterality: N/A;  . CARDIAC CATHETERIZATION N/A 05/31/2015   Procedure: Coronary Stent Intervention;  Surgeon: Jettie Booze, MD;  Location: [place] CV LAB;  Service: Cardiovascular;  Laterality: N/A;  . COLON SURGERY    . HAND SURGERY Left   . VASECTOMY  2008     Current Outpatient Medications  Medication Sig Dispense Refill  . acetaminophen (TYLENOL) 650 MG CR tablet Take 650 mg by mouth every 8 (eight) hours as needed for pain.    Marland Kitchen alfuzosin (UROXATRAL) 10 MG 24 hr tablet Take 10 mg by mouth daily with breakfast.    . clopidogrel (PLAVIX) 75 MG tablet TAKE 1 TABLET (75 MG TOTAL) BY MOUTH DAILY.  30 tablet 5  . ezetimibe (ZETIA) 10 MG tablet TAKE 1 TABLET (10 MG TOTAL) BY MOUTH DAILY. 90 tablet 3  . Fluocinolone Acetonide 0.01 % OIL PLACE 3 DROPS INTO AFFECTED EAR(S) TWICE DAILY AS DIRECTED AS NEEDED    . fluticasone (FLONASE) 50 MCG/ACT nasal spray Place 1 spray into the nose daily as needed for allergies.     Marland Kitchen gabapentin (NEURONTIN) 100 MG capsule Take 100 mg by mouth at bedtime.    . nitroGLYCERIN (NITROSTAT) 0.4 MG SL tablet Place 1 tablet (0.4 mg total) under the tongue every 5 (five) minutes x 3 doses as needed for chest pain. 25 tablet 3  . Polyethyl Glycol-Propyl Glycol (SYSTANE) 0.4-0.3 % GEL ophthalmic gel Place 1 application into both eyes 2 (two) times daily as needed.    . polyethylene glycol (MIRALAX) packet Take 17 g by mouth daily as needed for moderate constipation.     . triamcinolone cream (KENALOG) 0.1 % Apply 1 application topically as needed.  0  . valsartan (DIOVAN) 40 MG tablet Take 1 tablet (40 mg total) by mouth daily. 30 tablet 0   No current facility-administered medications for this visit.     Allergies:   Bactrim [sulfamethoxazole-trimethoprim]; Cardura [doxazosin mesylate]; Doxycycline; Flomax [tamsulosin hcl]; Levaquin [levofloxacin in d5w]; Lipitor [atorvastatin]; Penicillins; Sulfa antibiotics; Vytorin [ezetimibe-simvastatin]; Lisinopril; Niaspan [niacin]; Clindamycin/lincomycin; and Zithromax [azithromycin]    Social History:  The patient  reports that he quit smoking about 45 years ago. His smoking use included cigarettes. He started smoking about 55 years ago. He has a 10.00 pack-year smoking history. He has never used smokeless tobacco. He reports that he drinks about 0.6 oz of alcohol per week. He reports that he does not use drugs.   Family History:  The patient's family history includes ALS in his brother; Breast cancer in his daughter; Prostate cancer in his father; Stroke in his mother.    ROS:  Please see the history of present illness.    Otherwise, review of systems are positive for back pain.   All other systems are reviewed and negative.    PHYSICAL EXAM: VS:  BP 112/80   Pulse 69   Ht 5' 11.5" (1.816 m)   Wt 213 lb 12.8 oz (97 kg)   SpO2 94%   BMI 29.40 kg/m  , BMI Body mass index is 29.4 kg/m. GEN: Well nourished, well developed, in no acute distress  HEENT: normal  Neck: no JVD, carotid bruits, or masses Cardiac: RRR; no murmurs, rubs, or gallops,no edema  Respiratory:  clear to auscultation bilaterally, normal work of breathing GI: soft, nontender, nondistended, + BS  MS: no deformity or atrophy  Skin: warm and dry, no rash Neuro:  Strength and sensation are intact Psych: euthymic mood, full affect   EKG:   The ekg ordered today demonstrates normal ECG, no ST changes   Recent Labs: 07/28/2016: BUN 12; Creatinine, Ser 0.98; Hemoglobin 14.9; Platelets 244; Potassium 3.9; Sodium 137   Lipid Panel    Component Value Date/Time   CHOL 141 12/04/2016 1049   TRIG 64 12/04/2016 1049   HDL 38 (L) 12/04/2016 1049   CHOLHDL 3.7 12/04/2016 1049   CHOLHDL 4.8 09/09/2015 1122   VLDL 20 09/09/2015 1122   LDLCALC 90 12/04/2016 1049     Other studies Reviewed: Additional studies/ records that were reviewed today with results demonstrating: cath result reviewed. 2017 EF 55-60%.   ASSESSMENT AND PLAN:  1. CAD/MI: No angina on medical therapy.  Activity has been decreased and he thinks this has affected his stamina.  2. Hyperlipidemia: LDL 90. Continue Zetia.  No statin due to memory concern. 3. HTN: The current medical regimen is effective;  continue present plan and medications.   4. Memory disturbance: Worse after stroke.  Statin was stopped for this.  5. He has some stressors at home.  His wife's health is deteriorating.  He is concerned about his ability to care for her in the long-term.  Fortunately, he has a son and daughter who live nearby who are helpful.   Current medicines are reviewed at length with  the patient today.  The patient concerns regarding his medicines were addressed.  The following changes have been made:  No change  Labs/ tests ordered today include:  No orders of the defined types were placed in this encounter.   Recommend 150 minutes/week of aerobic exercise Low fat, low carb, high fiber diet recommended  Disposition:   FU in 1 year   Signed, Larae Grooms, MD  06/18/2017 10:44 AM    [facility] [place], [place], [place]  [address] Phone: [phone]; Fax: [phone]

## 2017-06-18 ENCOUNTER — Ambulatory Visit (INDEPENDENT_AMBULATORY_CARE_PROVIDER_SITE_OTHER): Payer: Medicare Other | Admitting: Interventional Cardiology

## 2017-06-18 ENCOUNTER — Encounter: Payer: Self-pay | Admitting: Interventional Cardiology

## 2017-06-18 VITALS — BP 112/80 | HR 69 | Ht 71.5 in | Wt 213.8 lb

## 2017-06-18 DIAGNOSIS — R413 Other amnesia: Secondary | ICD-10-CM | POA: Diagnosis not present

## 2017-06-18 DIAGNOSIS — I10 Essential (primary) hypertension: Secondary | ICD-10-CM

## 2017-06-18 DIAGNOSIS — I25119 Atherosclerotic heart disease of native coronary artery with unspecified angina pectoris: Secondary | ICD-10-CM

## 2017-06-18 DIAGNOSIS — E782 Mixed hyperlipidemia: Secondary | ICD-10-CM | POA: Diagnosis not present

## 2017-06-18 NOTE — Patient Instructions (Signed)

## 2017-12-31 ENCOUNTER — Other Ambulatory Visit: Payer: Self-pay | Admitting: Interventional Cardiology

## 2018-01-02 ENCOUNTER — Telehealth: Payer: Self-pay | Admitting: Interventional Cardiology

## 2018-01-02 NOTE — Telephone Encounter (Signed)
Please obtained more information  # of teeth extraction Type of anesthesia Fax # ([email or web address]) if possible   [employer], PA

## 2018-01-02 NOTE — Telephone Encounter (Signed)
°  1. What dental office are you calling from? [facility].   2. What is your office phone number? [phone]  3. What is your fax number? Fax broke please call  4. What type of procedure is the patient having performed? Filling and extractions   5. What date is procedure scheduled or is the patient there now? Today and pending.  6. What is your question (ex. Antibiotics prior to procedure, holding medication-we need to know how long dentist wants pt to hold med)? Do patient need a antibiotic patient on  Blood thinner.

## 2018-01-02 NOTE — Telephone Encounter (Signed)
Spoke with [place] at [facility]: pt is having 1 tooth extracted, Novocain for anesthesia. Fax machine is still broke. We can e-mail to [email or web address] or mail to [place]. [place], [place] [address].  [place] would like to know should pt have pre-meds before each cleaning/procedure?

## 2018-01-06 NOTE — Telephone Encounter (Signed)
Ok to continue Plavix for single dental extraction. He does not require pre op antibiotics.

## 2018-01-07 NOTE — Telephone Encounter (Signed)
I called the patient to inform him of no need to hold plavix and no need for pre op antibiotics.

## 2018-01-13 ENCOUNTER — Other Ambulatory Visit: Payer: Self-pay | Admitting: Physician Assistant

## 2018-06-16 ENCOUNTER — Telehealth: Payer: Self-pay

## 2018-06-16 ENCOUNTER — Telehealth: Payer: Self-pay | Admitting: Interventional Cardiology

## 2018-06-16 NOTE — Telephone Encounter (Signed)
Spoke with patient who agrees to keep virtual visit appointment on 4/21

## 2018-06-16 NOTE — Telephone Encounter (Signed)
New Message     Pt is calling about his virtual appt and he wants to know if it would be okay to wait and see the Dr in August    Please call

## 2018-06-16 NOTE — Telephone Encounter (Signed)
Virtual Visit Pre-Appointment Phone Call  Steps For Call:  1. Confirm consent - "In the setting of the current Covid19 crisis, you are scheduled for a (phone or video) visit with your provider on (date) at (time).  Just as we do with many in-office visits, in order for you to participate in this visit, we must obtain consent.  If you'd like, I can send this to your mychart (if signed up) or email for you to review.  Otherwise, I can obtain your verbal consent now.  All virtual visits are billed to your insurance company just like a normal visit would be.  By agreeing to a virtual visit, we'd like you to understand that the technology does not allow for your provider to perform an examination, and thus may limit your provider's ability to fully assess your condition.  Finally, though the technology is pretty good, we cannot assure that it will always work on either your or our end, and in the setting of a video visit, we may have to convert it to a phone-only visit.  In either situation, we cannot ensure that we have a secure connection.  Are you willing to proceed?"  2. Give patient instructions for WebEx download to smartphone as below if video visit  3. Advise patient to be prepared with any vital sign or heart rhythm information, their current medicines, and a piece of paper and pen handy for any instructions they may receive the day of their visit  4. Inform patient they will receive a phone call 15 minutes prior to their appointment time (may be from unknown caller ID) so they should be prepared to answer  5. Confirm that appointment type is correct in Epic appointment notes (video vs telephone)    TELEPHONE CALL NOTE  Peter [name] has been deemed a candidate for a follow-up tele-health visit to limit community exposure during the Covid-19 pandemic. I spoke with the patient via phone to ensure availability of phone/video source, confirm preferred email & phone number, and discuss  instructions and expectations.  I reminded Peter [name] to be prepared with any vital sign and/or heart rhythm information that could potentially be obtained via home monitoring, at the time of his visit. I reminded Peter [name] to expect a phone call at the time of his visit if his visit.  Did the patient verbally acknowledge consent to treatment? yes  Ludger Nutting Adabella Stanis, CMA 06/16/2018 4:05 PM   DOWNLOADING THE [place]  - If Apple, go to [employer] and type in WebEx in the search bar. [place] [employer], the blue/green circle. The app is free but as with any other app downloads, their phone may require them to verify saved payment information or Apple password. The patient does NOT have to create an account.  - If Android, ask patient to go to [employer] and type in WebEx in the search bar. [place] [employer], the blue/green circle. The app is free but as with any other app downloads, their phone may require them to verify saved payment information or Android password. The patient does NOT have to create an account.   CONSENT FOR TELE-HEALTH VISIT - PLEASE REVIEW  I hereby voluntarily request, consent and authorize [facility] and its employed or contracted physicians, physician assistants, nurse practitioners or other licensed health care professionals (the Practitioner), to provide me with telemedicine health care services (the "Services") as deemed necessary by  the treating Practitioner. I acknowledge and consent to receive the Services by the Practitioner via telemedicine. I understand that the telemedicine visit will involve communicating with the Practitioner through live audiovisual communication technology and the disclosure of certain medical information by electronic transmission. I acknowledge that I have been given the opportunity to request an in-person assessment or other available alternative prior to the telemedicine  visit and am voluntarily participating in the telemedicine visit.  I understand that I have the right to withhold or withdraw my consent to the use of telemedicine in the course of my care at any time, without affecting my right to future care or treatment, and that the Practitioner or I may terminate the telemedicine visit at any time. I understand that I have the right to inspect all information obtained and/or recorded in the course of the telemedicine visit and may receive copies of available information for a reasonable fee.  I understand that some of the potential risks of receiving the Services via telemedicine include:  Marland Kitchen Delay or interruption in medical evaluation due to technological equipment failure or disruption; . Information transmitted may not be sufficient (e.g. poor resolution of images) to allow for appropriate medical decision making by the Practitioner; and/or  . In rare instances, security protocols could fail, causing a breach of personal health information.  Furthermore, I acknowledge that it is my responsibility to provide information about my medical history, conditions and care that is complete and accurate to the best of my ability. I acknowledge that Practitioner's advice, recommendations, and/or decision may be based on factors not within their control, such as incomplete or inaccurate data provided by me or distortions of diagnostic images or specimens that may result from electronic transmissions. I understand that the practice of medicine is not an exact science and that Practitioner makes no warranties or guarantees regarding treatment outcomes. I acknowledge that I will receive a copy of this consent concurrently upon execution via email to the email address I last provided but may also request a printed copy by calling the office of [facility].    I understand that my insurance will be billed for this visit.   I have read or had this consent read to me. . I  understand the contents of this consent, which adequately explains the benefits and risks of the Services being provided via telemedicine.  . I have been provided ample opportunity to ask questions regarding this consent and the Services and have had my questions answered to my satisfaction. . I give my informed consent for the services to be provided through the use of telemedicine in my medical care  By participating in this telemedicine visit I agree to the above.

## 2018-06-24 ENCOUNTER — Telehealth: Payer: Medicare Other | Admitting: Interventional Cardiology

## 2018-07-07 ENCOUNTER — Other Ambulatory Visit: Payer: Self-pay | Admitting: Interventional Cardiology

## 2018-07-12 ENCOUNTER — Other Ambulatory Visit: Payer: Self-pay | Admitting: Physician Assistant

## 2018-10-29 NOTE — Progress Notes (Signed)
Cardiology Office Note   Date:  10/31/2018   ID:  SOCTT OBROCHTA [name], DOB 12/24/1933, MRN [id number]  PCP:  Peter Dials, PA-C    No chief complaint on file.  CAD  Wt Readings from Last 3 Encounters:  10/31/18 207 lb 9.6 oz (94.2 kg)  06/18/17 213 lb 12.8 oz (97 kg)  12/10/16 213 lb 9.6 oz (96.9 kg)       History of Present Illness: Peter [name] is a 85 y.o. male  With h/o CAD/MI.    Prior record showed: " Admitted in 3/17 with inferior STEMI. LHC demonstrated 99% stenosis in the posterior lateral artery treated with a Synergy DES. He had mild to moderate nonobstructive disease elsewhere. EF was preserved by echocardiogram. Given need for upcoming prostate biopsy, it was recommended that he remain on dual antiplatelet therapy for at least 3 months minimum. He had issues with statins in the past and was restarted on low dose Crestor only but has stopped this.  He had some chest pain, dyspnea after the MI in May 2017. Different than his prior angina. Cardiolite in May 2017 showed some inferior scar but no ischemia.   His Crestor was stopped due to concerns about his memoryin 2017. After stoppping the Crestor, he initially felt better, but sx are now worse again. Back pain especially is worse. He did not go on Crestor again, he started pravastatin. He thinks the pravastatin may be responsible for all of his current problems including memory issues, weakness, and urinary retention. He stopped pravastatin.   He had a stroke in July 2017. He has recovered well from the stroke.   He hadwisdomteeth pulled in Dec 2017. He had some low BP after this when he was on pain killers in combination with his prostate med.  He was diagnosed with prostate cancer. He is under active surveillance. He has back pain. If PSA increases, he would get hormone therapy. He is not considering surgery at this time. He has persistent GU pain, but had an accident many years ago  at work in that area."  In 2019, developed an L5 disc problem. He had some stressors at home.  His wife's health is deteriorating.  He is concerned about his ability to care for her in the long-term.  Fortunately, he has a son and daughter who live nearby who are helpful.  Daughter developed cancer in 2020.   Since the last visit, he has been fine from a heart standpoint.    Denies : Chest pain. Dizziness. Leg edema. Nitroglycerin use. Orthopnea. Palpitations. Paroxysmal nocturnal dyspnea. Shortness of breath. Syncope.   Still reports memory problems.  He was referred for testing but he does not want to do it.  He is wondering if there is a medicine he could take.   Not walking much since COVID 19 hit.    Stays out of crowds.  Has had some family gatherings.      Past Medical History:  Diagnosis Date  . Anxiety   . Arthritis   . CAD (coronary artery disease)    a. 05/2015: STEMI: 99% stenosis of Posterolateral w/ Synergy DES placed.  . Cataract   . Chronic back pain   . History of nuclear stress test    ETT-Myoview 5/17: EF 54%, inf scar, no ischemia; Low Risk  . HLD (hyperlipidemia)   . Hypertension   . Loss of balance   . Prostate cancer ([place])   . Prostate cancer screening    "  for several years"  . Prostate disease   . Reflux   . Sepsis [facility]) February 2016   Pt stated he was treated at [facility] for this   . Shoulder pain   . Skin cancer   . Stroke [facility]) 09/08/2015    Past Surgical History:  Procedure Laterality Date  . APPENDECTOMY    . CARDIAC CATHETERIZATION N/A 05/31/2015   Procedure: Left Heart Cath and Coronary Angiography;  Surgeon: Jettie Booze, MD;  Location: [facility];  Service: Cardiovascular;  Laterality: N/A;  . CARDIAC CATHETERIZATION N/A 05/31/2015   Procedure: Coronary Stent Intervention;  Surgeon: Jettie Booze, MD;  Location: [facility];  Service: Cardiovascular;  Laterality: N/A;  . COLON SURGERY    . HAND SURGERY  Left   . VASECTOMY  2008     Current Outpatient Medications  Medication Sig Dispense Refill  . acetaminophen (TYLENOL) 650 MG CR tablet Take 650 mg by mouth every 8 (eight) hours as needed for pain.    Marland Kitchen alfuzosin (UROXATRAL) 10 MG 24 hr tablet Take 10 mg by mouth daily with breakfast.    . clopidogrel (PLAVIX) 75 MG tablet TAKE 1 TABLET (75 MG TOTAL) BY MOUTH DAILY. 90 tablet 1  . ezetimibe (ZETIA) 10 MG tablet TAKE 1 TABLET (10 MG TOTAL) BY MOUTH DAILY. 90 tablet 1  . Fluocinolone Acetonide 0.01 % OIL PLACE 3 DROPS INTO AFFECTED EAR(S) TWICE DAILY AS DIRECTED AS NEEDED    . fluticasone (FLONASE) 50 MCG/ACT nasal spray Place 1 spray into the nose daily as needed for allergies.     Marland Kitchen gabapentin (NEURONTIN) 100 MG capsule Take 100 mg by mouth at bedtime.    . nitroGLYCERIN (NITROSTAT) 0.4 MG SL tablet Place 1 tablet (0.4 mg total) under the tongue every 5 (five) minutes x 3 doses as needed for chest pain. 25 tablet 3  . Polyethyl Glycol-Propyl Glycol (SYSTANE) 0.4-0.3 % GEL ophthalmic gel Place 1 application into both eyes 2 (two) times daily as needed.    . polyethylene glycol (MIRALAX) packet Take 17 g by mouth daily as needed for moderate constipation.     . valsartan (DIOVAN) 40 MG tablet Take 1 tablet (40 mg total) by mouth daily. 30 tablet 0   No current facility-administered medications for this visit.     Allergies:   Bactrim [sulfamethoxazole-trimethoprim], Cardura [doxazosin mesylate], Doxycycline, Flomax [tamsulosin hcl], Levaquin [levofloxacin in d5w], Lipitor [atorvastatin], Penicillins, Sulfa antibiotics, Vytorin [ezetimibe-simvastatin], Lisinopril, Niaspan [niacin], Clindamycin/lincomycin, and Zithromax [azithromycin]    Social History:  The patient  reports that he quit smoking about 46 years ago. His smoking use included cigarettes. He started smoking about 56 years ago. He has a 10.00 pack-year smoking history. He has never used smokeless tobacco. He reports current alcohol  use of about 1.0 standard drinks of alcohol per week. He reports that he does not use drugs.   Family History:  The patient's family history includes ALS in his brother; Breast cancer in his daughter; Prostate cancer in his father; Stroke in his mother.    ROS:  Please see the history of present illness.   Otherwise, review of systems are positive for memory loss.   All other systems are reviewed and negative.    PHYSICAL EXAM: VS:  BP 126/72   Pulse 67   Ht 5' 11.5" (1.816 m)   Wt 207 lb 9.6 oz (94.2 kg)   SpO2 93%   BMI 28.55 kg/m  , BMI Body  mass index is 28.55 kg/m. GEN: Well nourished, well developed, in no acute distress  HEENT: normal  Neck: no JVD, carotid bruits, or masses Cardiac: RRR; no murmurs, rubs, or gallops,no edema ; premature beats noted Respiratory:  clear to auscultation bilaterally, normal work of breathing GI: soft, nontender, nondistended, + BS MS: no deformity or atrophy  Skin: warm and dry, no rash Neuro:  Strength and sensation are intact Psych: euthymic mood, full affect   EKG:   The ekg ordered today demonstrates NSR, nonspecific S changes   Recent Labs: No results found for requested labs within last 8760 hours.   Lipid Panel    Component Value Date/Time   CHOL 141 12/04/2016 1049   TRIG 64 12/04/2016 1049   HDL 38 (L) 12/04/2016 1049   CHOLHDL 3.7 12/04/2016 1049   CHOLHDL 4.8 09/09/2015 1122   VLDL 20 09/09/2015 1122   LDLCALC 90 12/04/2016 1049     Other studies Reviewed: Additional studies/ records that were reviewed today with results demonstrating: LDL 90 in 2018, and in 04/2018   ASSESSMENT AND PLAN:  1. CAD/MI: No angina.  Increase exercise.  Improve diet- this has been a challenge since he is home more.  2. Hyperlipidemia: Labs ok with PMD in 04/2018.  COntinue Zetia.  Wants to avoid statins.   I again recommended statin for long term preventive therapy.  3. Memory disturbance: Statins were stopped for this.  I asked him to  check with Dr. Frederico Hamman.  4.   HTN: The current medical regimen is effective;  continue present plan and medications.   Current medicines are reviewed at length with the patient today.  The patient concerns regarding his medicines were addressed.  The following changes have been made:  No change  Labs/ tests ordered today include:  No orders of the defined types were placed in this encounter.   Recommend 150 minutes/week of aerobic exercise Low fat, low carb, high fiber diet recommended  Disposition:   FU in 1 year   Signed, Larae Grooms, MD  10/31/2018 9:08 AM    [facility] [address], [place], [place]  [address] Phone: [phone]; Fax: [phone]

## 2018-10-31 ENCOUNTER — Ambulatory Visit (INDEPENDENT_AMBULATORY_CARE_PROVIDER_SITE_OTHER): Payer: Medicare Other | Admitting: Interventional Cardiology

## 2018-10-31 ENCOUNTER — Encounter: Payer: Self-pay | Admitting: Interventional Cardiology

## 2018-10-31 ENCOUNTER — Other Ambulatory Visit: Payer: Self-pay

## 2018-10-31 VITALS — BP 126/72 | HR 67 | Ht 71.5 in | Wt 207.6 lb

## 2018-10-31 DIAGNOSIS — R413 Other amnesia: Secondary | ICD-10-CM

## 2018-10-31 DIAGNOSIS — I10 Essential (primary) hypertension: Secondary | ICD-10-CM

## 2018-10-31 DIAGNOSIS — E782 Mixed hyperlipidemia: Secondary | ICD-10-CM

## 2018-10-31 DIAGNOSIS — I25119 Atherosclerotic heart disease of native coronary artery with unspecified angina pectoris: Secondary | ICD-10-CM

## 2018-10-31 NOTE — Patient Instructions (Signed)
Medication Instructions:  Your physician recommends that you continue on your current medications as directed. Please refer to the Current Medication list given to you today.  If you need a refill on your cardiac medications before your next appointment, please call your pharmacy.   Lab work: NONE  If you have labs (blood work) drawn today and your tests are completely normal, you will receive your results only by: Marland Kitchen MyChart Message (if you have MyChart) OR . A paper copy in the mail If you have any lab test that is abnormal or we need to change your treatment, we will call you to review the results.  Testing/Procedures: NONE  Follow-Up: At [facility], you and your health needs are our priority.  As part of our continuing mission to provide you with exceptional heart care, we have created designated Provider Care Teams.  These Care Teams include your primary Cardiologist (physician) and Advanced Practice Providers (APPs -  Physician Assistants and Nurse Practitioners) who all work together to provide you with the care you need, when you need it. You will need a follow up appointment in 12 months.  Please call our office 2 months in advance to schedule this appointment.  You may see Dr. Irish Lack or one of the following Advanced Practice Providers on your designated Care Team:   [place], PA-C Melina Copa, PA-C . Ermalinda Barrios, PA-C  Any Other Special Instructions Will Be Listed Below (If Applicable).

## 2019-01-20 ENCOUNTER — Other Ambulatory Visit: Payer: Self-pay | Admitting: Interventional Cardiology

## 2019-02-05 ENCOUNTER — Other Ambulatory Visit: Payer: Self-pay | Admitting: Physician Assistant

## 2019-02-12 ENCOUNTER — Encounter: Payer: Self-pay | Admitting: Neurology

## 2019-02-12 ENCOUNTER — Other Ambulatory Visit: Payer: Self-pay

## 2019-02-12 ENCOUNTER — Encounter

## 2019-02-12 ENCOUNTER — Ambulatory Visit (INDEPENDENT_AMBULATORY_CARE_PROVIDER_SITE_OTHER): Payer: Medicare Other | Admitting: Neurology

## 2019-02-12 VITALS — BP 148/83 | HR 74 | Temp 97.4°F | Ht 71.5 in | Wt 215.0 lb

## 2019-02-12 DIAGNOSIS — R413 Other amnesia: Secondary | ICD-10-CM | POA: Diagnosis not present

## 2019-02-12 MED ORDER — MEMANTINE HCL 10 MG PO TABS: 10.0000 mg | ORAL_TABLET | Freq: Two times a day (BID) | ORAL | 11 refills | Status: DC

## 2019-02-12 NOTE — Progress Notes (Signed)
PATIENT: Peter [name] DOB: 08/11/1933  Chief Complaint  Patient presents with  . Memory Loss    MMSE 29/30 - 11 animals.  He was last seen here for memory in 2015. His memory is worse now, along with delayed recall.  He gives the example of having a hard time recalling names or pulling things out of the refrigerator and forgetting to put them back.  He had a stroke in July 2017 and heart attack in March 2017.  Says COVID-19 restrictions have cause him added stress.   Marland Kitchen PCP    Aura Dials, PA-C     HISTORICAL  Peter [name] is a 85 year old male, seen in request by her primary care PA Aura Dials for evaluation of memory loss, evaluation was on February 12, 2019.  I have reviewed and summarized the referring note from the referring physician.  He had a past medical history of blood pressure, I saw him previously in 2015 for a constellation of complaints, this including mild memory loss, anxiety,  He retired as a Nutritional therapist at age 84, enjoying fishing, playing golf, since 2015, he noticed mild memory loss, more noticeable since 2018, he denies difficulty driving, he tends to [employer] things, open a cabinet when he was trying to find something in the refrigerator.  But he denies daily difficulty, his fraternal twin brother died of Leverne Humbles disease  I personally reviewed MRI of the brain in May 2018, there was no acute abnormality  Laboratory evaluations in February 2020, CMP showed mild elevated glucose 101, LDL 85  REVIEW OF SYSTEMS: Full 14 system review of systems performed and notable only for as above All other review of systems were negative.  ALLERGIES: Allergies  Allergen Reactions  . Bactrim [Sulfamethoxazole-Trimethoprim] Itching  . Cardura [Doxazosin Mesylate] Other (See Comments)    unspecified  . Doxycycline Itching  . Flomax [Tamsulosin Hcl] Other (See Comments)    unspecified unspecified  . Levaquin [Levofloxacin In D5w] Swelling and  Other (See Comments)    Joint swelling  . Lipitor [Atorvastatin] Nausea And Vomiting  . Penicillins Swelling    2/19 Pt states he had Penicillin challenge that was negative for reaction. Has patient had a PCN reaction causing immediate rash, facial/tongue/throat swelling, SOB or lightheadedness with hypotension: No Has patient had a PCN reaction causing severe rash involving mucus membranes or skin necrosis: No Has patient had a PCN reaction that required hospitalization: No Has patient had a PCN reaction occurring within the last 10 years: Yes If all of the above answers are "NO", then may proceed with Ceph  . Sulfa Antibiotics Itching  . Vytorin [Ezetimibe-Simvastatin] Nausea And Vomiting  . Lisinopril Other (See Comments)    unknown  . Niaspan [Niacin] Swelling and Other (See Comments)    Joint swelling  . Clindamycin/Lincomycin Rash  . Zithromax [Azithromycin] Rash    HOME MEDICATIONS: Current Outpatient Medications  Medication Sig Dispense Refill  . acetaminophen (TYLENOL) 650 MG CR tablet Take 650 mg by mouth every 8 (eight) hours as needed for pain.    Marland Kitchen alfuzosin (UROXATRAL) 10 MG 24 hr tablet Take 10 mg by mouth daily with breakfast.    . busPIRone (BUSPAR) 5 MG tablet Take 5 mg by mouth 2 (two) times daily as needed (stress).    . clopidogrel (PLAVIX) 75 MG tablet TAKE 1 TABLET BY MOUTH EVERY DAY 90 tablet 2  . ezetimibe (ZETIA) 10 MG tablet TAKE 1 TABLET BY MOUTH  EVERY DAY 90 tablet 2  . Fluocinolone Acetonide 0.01 % OIL PLACE 3 DROPS INTO AFFECTED EAR(S) TWICE DAILY AS DIRECTED AS NEEDED    . fluticasone (FLONASE) 50 MCG/ACT nasal spray Place 1 spray into the nose daily as needed for allergies.     Marland Kitchen gabapentin (NEURONTIN) 100 MG capsule Take 200 mg by mouth at bedtime.     . nitroGLYCERIN (NITROSTAT) 0.4 MG SL tablet Place 1 tablet (0.4 mg total) under the tongue every 5 (five) minutes x 3 doses as needed for chest pain. 25 tablet 3  . Polyethyl Glycol-Propyl Glycol  (SYSTANE) 0.4-0.3 % GEL ophthalmic gel Place 1 application into both eyes 2 (two) times daily as needed.    . polyethylene glycol (MIRALAX) packet Take 17 g by mouth daily as needed for moderate constipation.     . valsartan (DIOVAN) 40 MG tablet Take 1 tablet (40 mg total) by mouth daily. 30 tablet 0   No current facility-administered medications for this visit.    PAST MEDICAL HISTORY: Past Medical History:  Diagnosis Date  . Anxiety   . Arthritis   . CAD (coronary artery disease)    a. 05/2015: STEMI: 99% stenosis of Posterolateral w/ Synergy DES placed.  . Cataract   . Chronic back pain   . Heart attack ([place])   . History of nuclear stress test    ETT-Myoview 5/17: EF 54%, inf scar, no ischemia; Low Risk  . HLD (hyperlipidemia)   . Hypertension   . Loss of balance   . Prostate cancer ([place])   . Prostate cancer screening    "for several years"  . Prostate disease   . Reflux   . Sepsis [facility]) February 2016   Pt stated he was treated at [facility] for this   . Shoulder pain   . Skin cancer   . Stroke (cerebrum) ([place])   . Stroke ([place]) 09/08/2015    PAST SURGICAL HISTORY: Past Surgical History:  Procedure Laterality Date  . APPENDECTOMY    . CARDIAC CATHETERIZATION N/A 05/31/2015   Procedure: Left Heart Cath and Coronary Angiography;  Surgeon: Jettie Booze, MD;  Location: [place] [facility];  Service: Cardiovascular;  Laterality: N/A;  . CARDIAC CATHETERIZATION N/A 05/31/2015   Procedure: Coronary Stent Intervention;  Surgeon: Jettie Booze, MD;  Location: [place] [facility];  Service: Cardiovascular;  Laterality: N/A;  . COLON SURGERY    . HAND SURGERY Left   . VASECTOMY  2008    FAMILY HISTORY: Family History  Problem Relation Age of Onset  . Stroke Mother   . Prostate cancer Father   . ALS Brother   . Breast cancer Daughter     SOCIAL HISTORY: Social History   Socioeconomic History  . Marital status: Married    Spouse name: [name]  . Number of  children: 2  . Years of education: college  . Highest education level: Not on file  Occupational History  . Occupation: Retired- Network engineer work     Fish farm manager: RETIRED  Tobacco Use  . Smoking status: Former Smoker    Packs/day: 1.00    Years: 10.00    Pack years: 10.00    Types: Cigarettes    Start date: 03/05/1962    Quit date: 03/05/1972    Years since quitting: 46.9  . Smokeless tobacco: Never Used  Substance and Sexual Activity  . Alcohol use: Yes    Alcohol/week: 1.0 standard drinks    Types: 1 Cans of beer per  week    Comment: Once a month  . Drug use: No  . Sexual activity: Never  Other Topics Concern  . Not on file  Social History Narrative   Patient lives at home with his spouse. [name])   Caffeine Use: 2 cups of coffee daily   Retired    [place]   Right handed   Social Determinants of Health   Financial Resource Strain:   . Difficulty of Paying Living Expenses: Not on file  Food Insecurity:   . Worried About Charity fundraiser in the Last Year: Not on file  . Ran Out of Food in the Last Year: Not on file  Transportation Needs:   . Lack of Transportation (Medical): Not on file  . Lack of Transportation (Non-Medical): Not on file  Physical Activity:   . Days of Exercise per Week: Not on file  . Minutes of Exercise per Session: Not on file  Stress:   . Feeling of Stress : Not on file  Social Connections:   . Frequency of Communication with Friends and Family: Not on file  . Frequency of Social Gatherings with Friends and Family: Not on file  . Attends Religious Services: Not on file  . Active Member of Clubs or Organizations: Not on file  . Attends Archivist Meetings: Not on file  . Marital Status: Not on file  Intimate Partner Violence:   . Fear of Current or Ex-Partner: Not on file  . Emotionally Abused: Not on file  . Physically Abused: Not on file  . Sexually Abused: Not on file     PHYSICAL EXAM   Vitals:   02/12/19 0942  BP:  (!) 148/83  Pulse: 74  Temp: (!) 97.4 F (36.3 C)  Weight: 215 lb (97.5 kg)  Height: 5' 11.5" (1.816 m)    Not recorded      Body mass index is 29.57 kg/m.  PHYSICAL EXAMNIATION:  Gen: NAD, conversant, well nourised, well groomed                     Cardiovascular: Regular rate rhythm, no peripheral edema, warm, nontender. Eyes: Conjunctivae clear without exudates or hemorrhage Neck: Supple, no carotid bruits. Pulmonary: Clear to auscultation bilaterally   NEUROLOGICAL EXAM:  MMSE - Mini Mental State Exam 02/12/2019  Orientation to time 5  Orientation to Place 5  Registration 3  Attention/ Calculation 5  Recall 2  Language- name 2 objects 2  Language- repeat 1  Language- follow 3 step command 3  Language- read & follow direction 1  Write a sentence 1  Copy design 1  Total score 29   Animal naming 11 CRANIAL NERVES: CN [name]: Visual fields are full to confrontation. Pupils are round equal and briskly reactive to light. CN III, IV, VI: extraocular movement are normal. No ptosis. CN V: Facial sensation is intact to light touch CN VII: Face is symmetric with normal eye closure  CN VIII: Hearing is normal to causal conversation. CN IX, X: Phonation is normal. CN XI: Head turning and shoulder shrug are intact  MOTOR: There is no pronator drift of out-stretched arms. Muscle bulk and tone are normal. Muscle strength is normal.  REFLEXES: Reflexes are 2+ and symmetric at the biceps, triceps, knees, and ankles. Plantar responses are flexor.  SENSORY: Intact to light touch, pinprick and vibratory sensation are intact in fingers and toes.  COORDINATION: There is no trunk or limb dysmetria noted.  GAIT/STANCE: Posture  is normal. Gait is steady with normal steps, base, arm swing, and turning. Heel and toe walking are normal. Tandem gait is normal.  Romberg is absent.   DIAGNOSTIC DATA (LABS, IMAGING, TESTING) - I reviewed patient records, labs, notes, testing and  imaging myself where available.   ASSESSMENT AND PLAN  PRITHVI MERRIFIELD [name] is a 85 y.o. male   Mild cognitive impairment  Mini-Mental Status Examination 29/30, animal naming 11  MRI of the brain 2018 showed no significant abnormality  Laboratory evaluation to rule out treatable etiology including TSH, B12  I have emphasized with patient the importance of healthy lifestyle, including moderate exercise  He would want to try Namenda 10 mg twice a day  Return to clinic in 6 months   Marcial Pacas, M.D. Ph.D.  [facility] [address] [place], [place] [address] Ph: [phone] Fax: [phone]  CC: Aura Dials, PA-C

## 2019-02-13 LAB — CBC WITH DIFFERENTIAL/PLATELET
Basophils Absolute: 0 10*3/uL (ref 0.0–0.2)
Basos: 0 %
EOS (ABSOLUTE): 0.2 10*3/uL (ref 0.0–0.4)
Eos: 3 %
Hematocrit: 45.4 % (ref 37.5–51.0)
Hemoglobin: 15.1 g/dL (ref 13.0–17.7)
Immature Grans (Abs): 0 10*3/uL (ref 0.0–0.1)
Immature Granulocytes: 0 %
Lymphocytes Absolute: 1.1 10*3/uL (ref 0.7–3.1)
Lymphs: 15 %
MCH: 32.4 pg (ref 26.6–33.0)
MCHC: 33.3 g/dL (ref 31.5–35.7)
MCV: 97 fL (ref 79–97)
Monocytes Absolute: 0.6 10*3/uL (ref 0.1–0.9)
Monocytes: 9 %
Neutrophils Absolute: 5 10*3/uL (ref 1.4–7.0)
Neutrophils: 73 %
Platelets: 224 10*3/uL (ref 150–450)
RBC: 4.66 x10E6/uL (ref 4.14–5.80)
RDW: 12.3 % (ref 11.6–15.4)
WBC: 7 10*3/uL (ref 3.4–10.8)

## 2019-02-13 LAB — COMPREHENSIVE METABOLIC PANEL
ALT: 9 IU/L (ref 0–44)
AST: 15 IU/L (ref 0–40)
Albumin/Globulin Ratio: 1.8 (ref 1.2–2.2)
Albumin: 4.2 g/dL (ref 3.6–4.6)
Alkaline Phosphatase: 64 IU/L (ref 39–117)
BUN/Creatinine Ratio: 19 (ref 10–24)
BUN: 16 mg/dL (ref 8–27)
Bilirubin Total: 0.8 mg/dL (ref 0.0–1.2)
CO2: 23 mmol/L (ref 20–29)
Calcium: 9.1 mg/dL (ref 8.6–10.2)
Chloride: 106 mmol/L (ref 96–106)
Creatinine, Ser: 0.83 mg/dL (ref 0.76–1.27)
GFR calc Af Amer: 94 mL/min/{1.73_m2} (ref >59)
GFR calc non Af Amer: 81 mL/min/{1.73_m2} (ref >59)
Globulin, Total: 2.4 g/dL (ref 1.5–4.5)
Glucose: 95 mg/dL (ref 65–99)
Potassium: 4.3 mmol/L (ref 3.5–5.2)
Sodium: 140 mmol/L (ref 134–144)
Total Protein: 6.6 g/dL (ref 6.0–8.5)

## 2019-02-13 LAB — TSH: TSH: 1.28 u[IU]/mL (ref 0.450–4.500)

## 2019-02-13 LAB — RPR: RPR Ser Ql: NONREACTIVE

## 2019-02-13 LAB — VITAMIN B12: Vitamin B-12: 280 pg/mL (ref 232–1245)

## 2019-02-25 ENCOUNTER — Telehealth: Payer: Self-pay | Admitting: Neurology

## 2019-02-25 NOTE — Telephone Encounter (Signed)
Dr Krista Blue previously messaged him for normal labs. He stated he doesn't use my chart. I informed him all labs are normal. Patient verbalized understanding, appreciation.

## 2019-02-25 NOTE — Telephone Encounter (Signed)
Patient called to check on the status of his lab results. Please follow up.

## 2019-03-07 ENCOUNTER — Other Ambulatory Visit: Payer: Self-pay | Admitting: Neurology

## 2019-04-10 ENCOUNTER — Telehealth: Payer: Self-pay | Admitting: Interventional Cardiology

## 2019-04-10 NOTE — Telephone Encounter (Signed)
Attempted to contact patient but there was no answer and VM did not pick up. 

## 2019-04-10 NOTE — Telephone Encounter (Signed)
Pt c/o medication issue:  1. Name of Medication: Vitamin B12 520mcg  2. How are you currently taking this medication (dosage and times per day)? n/a  3. Are you having a reaction (difficulty breathing--STAT)? no  4. What is your medication issue? Patient wants to know if it is safe for him to take this vitamin.

## 2019-04-14 NOTE — Telephone Encounter (Signed)
Returned call to Pt.  Advised ok to take vitamin B.  Pt indicates understanding.

## 2019-04-14 NOTE — Telephone Encounter (Signed)
Patient returning call.

## 2019-08-13 ENCOUNTER — Ambulatory Visit: Payer: Medicare Other | Admitting: Neurology

## 2019-08-19 ENCOUNTER — Emergency Department (HOSPITAL_COMMUNITY)
Admission: EM | Admit: 2019-08-19 | Discharge: 2019-08-20 | Disposition: A | Discharge status: Home / Self Care | Payer: Medicare Other | Attending: Emergency Medicine | Admitting: Emergency Medicine

## 2019-08-19 ENCOUNTER — Encounter (HOSPITAL_COMMUNITY): Payer: Self-pay | Admitting: *Deleted

## 2019-08-19 ENCOUNTER — Other Ambulatory Visit: Payer: Self-pay

## 2019-08-19 ENCOUNTER — Emergency Department (HOSPITAL_COMMUNITY)
Admission: EM | Admit: 2019-08-19 | Discharge: 2019-08-19 | Discharge status: Absent without leave | Payer: Medicare Other

## 2019-08-19 DIAGNOSIS — Z8546 Personal history of malignant neoplasm of prostate: Secondary | ICD-10-CM | POA: Diagnosis not present

## 2019-08-19 DIAGNOSIS — R5383 Other fatigue: Secondary | ICD-10-CM

## 2019-08-19 DIAGNOSIS — I10 Essential (primary) hypertension: Secondary | ICD-10-CM | POA: Diagnosis not present

## 2019-08-19 DIAGNOSIS — Z79899 Other long term (current) drug therapy: Secondary | ICD-10-CM | POA: Diagnosis not present

## 2019-08-19 DIAGNOSIS — Z87891 Personal history of nicotine dependence: Secondary | ICD-10-CM | POA: Diagnosis not present

## 2019-08-19 DIAGNOSIS — R2681 Unsteadiness on feet: Secondary | ICD-10-CM | POA: Insufficient documentation

## 2019-08-19 DIAGNOSIS — I252 Old myocardial infarction: Secondary | ICD-10-CM | POA: Diagnosis not present

## 2019-08-19 DIAGNOSIS — R5381 Other malaise: Secondary | ICD-10-CM | POA: Diagnosis not present

## 2019-08-19 DIAGNOSIS — I2510 Atherosclerotic heart disease of native coronary artery without angina pectoris: Secondary | ICD-10-CM | POA: Diagnosis not present

## 2019-08-19 LAB — BASIC METABOLIC PANEL
Anion gap: 8 (ref 5–15)
BUN: 10 mg/dL (ref 8–23)
CO2: 24 mmol/L (ref 22–32)
Calcium: 8.7 mg/dL — ABNORMAL LOW (ref 8.9–10.3)
Chloride: 109 mmol/L (ref 98–111)
Creatinine, Ser: 0.88 mg/dL (ref 0.61–1.24)
GFR calc Af Amer: >60 mL/min (ref >60)
GFR calc non Af Amer: >60 mL/min (ref >60)
Glucose, Bld: 109 mg/dL — ABNORMAL HIGH (ref 70–99)
Potassium: 3.6 mmol/L (ref 3.5–5.1)
Sodium: 141 mmol/L (ref 135–145)

## 2019-08-19 LAB — CBC
HCT: 44.1 % (ref 39.0–52.0)
Hemoglobin: 14.6 g/dL (ref 13.0–17.0)
MCH: 32.3 pg (ref 26.0–34.0)
MCHC: 33.1 g/dL (ref 30.0–36.0)
MCV: 97.6 fL (ref 80.0–100.0)
Platelets: 224 10*3/uL (ref 150–400)
RBC: 4.52 MIL/uL (ref 4.22–5.81)
RDW: 12.1 % (ref 11.5–15.5)
WBC: 5.2 10*3/uL (ref 4.0–10.5)
nRBC: 0 % (ref 0.0–0.2)

## 2019-08-19 LAB — TROPONIN I (HIGH SENSITIVITY): Troponin I (High Sensitivity): 7 ng/L (ref <18)

## 2019-08-19 MED ORDER — SODIUM CHLORIDE 0.9% FLUSH
3.0000 mL | Freq: Once | INTRAVENOUS | Status: AC
  Administered 2019-08-20: 3 mL via INTRAVENOUS

## 2019-08-19 NOTE — ED Triage Notes (Signed)
The pt is c/o some weakness for several days  Today he has had dizziness with higher bp than normal sl headache

## 2019-08-20 ENCOUNTER — Emergency Department (HOSPITAL_COMMUNITY): Payer: Medicare Other

## 2019-08-20 DIAGNOSIS — R2681 Unsteadiness on feet: Secondary | ICD-10-CM | POA: Diagnosis not present

## 2019-08-20 LAB — TROPONIN I (HIGH SENSITIVITY): Troponin I (High Sensitivity): 9 ng/L (ref <18)

## 2019-08-20 MED ORDER — LORAZEPAM 2 MG/ML IJ SOLN
1.0000 mg | Freq: Once | INTRAMUSCULAR | Status: AC
  Administered 2019-08-20: 1 mg via INTRAVENOUS
  Filled 2019-08-20: qty 1

## 2019-08-20 NOTE — ED Notes (Signed)
Off floor to MRI

## 2019-08-20 NOTE — ED Provider Notes (Signed)
[facility] EMERGENCY DEPARTMENT Provider Note   CSN: [id number] Arrival date & time: 08/19/19  2113   History Chief Complaint  Patient presents with  . Dizziness  . Hypertension   Peter [name] is a 86 y.o. male with past medical history significant for CAD, chronic back pain, MI, current prostate cancer under observation who presents for evaluation of abnormal gait.  Patient states over the last few weeks is noted he has had difficulty with his gait states it feels "off."  States symptoms were acutely worsened today.  He was working in the yard when he went into the house and he felt like he needed to hold onto things to walk.  He denies any lightheadedness or dizziness.  No headache, chest pain, shortness of breath, facial droop, difficulty with word finding, paresthesias, weakness, dysuria, constipation, unilateral leg swelling, redness, warmth.  Denies additional aggravating or alleviating factors. No bowel or bladder incontinence, saddle paresthesia.  History obtained from patient and past medical records.  No interpreter used.  HPI     Past Medical History:  Diagnosis Date  . Anxiety   . Arthritis   . CAD (coronary artery disease)    a. 05/2015: STEMI: 99% stenosis of Posterolateral w/ Synergy DES placed.  . Cataract   . Chronic back pain   . Heart attack ([place])   . History of nuclear stress test    ETT-Myoview 5/17: EF 54%, inf scar, no ischemia; Low Risk  . HLD (hyperlipidemia)   . Hypertension   . Loss of balance   . Prostate cancer ([place])   . Prostate cancer screening    "for several years"  . Prostate disease   . Reflux   . Sepsis [facility]) February 2016   Pt stated he was treated at [facility] for this   . Shoulder pain   . Skin cancer   . Stroke (cerebrum) ([place])   . Stroke [facility]) 09/08/2015    Patient Active Problem List   Diagnosis Date Noted  . Cerebral infarction ([place]) 09/09/2015  . HLD (hyperlipidemia)   . CAD (coronary artery  disease)   . Acute CVA (cerebrovascular accident) ([place])   . ST elevation myocardial infarction (STEMI) of inferior wall ([place]) 05/31/2015  . ST elevation (STEMI) myocardial infarction involving other coronary artery of inferior wall ([place]) 05/31/2015  . Malignant neoplasm of prostate ([place])   . Hyperlipidemia   . Chronic pain of both knees 05/22/2015  . Constipation 04/23/2014  . Sepsis ([place]) 04/22/2014  . Abdominal pain 04/22/2014  . Essential hypertension 04/22/2014  . Subacute ethmoidal sinusitis   . Lactic acid increased   . Leucocytosis   . Cough 04/21/2014  . Paresthesia 02/08/2014  . CTS (carpal tunnel syndrome) 12/01/2012  . Memory changes 11/14/2012  . Shoulder pain   . Reflux   . Chronic back pain   . Loss of balance   . Dyspnea 09/14/2012  . HTN (hypertension) 09/14/2012  . Abnormal CT of the chest 09/14/2012  . Gastro-esophageal reflux disease with esophagitis 04/30/2012  . Shingles 11/10/2011  . BPH (benign prostatic hyperplasia) 07/12/2011  . Seasonal allergies 07/12/2011    Past Surgical History:  Procedure Laterality Date  . APPENDECTOMY    . CARDIAC CATHETERIZATION N/A 05/31/2015   Procedure: Left Heart Cath and Coronary Angiography;  Surgeon: Jettie Booze, MD;  Location: [place] CV LAB;  Service: Cardiovascular;  Laterality: N/A;  . CARDIAC CATHETERIZATION N/A 05/31/2015   Procedure: Coronary Stent Intervention;  Surgeon: Jettie Booze, MD;  Location: [place] CV LAB;  Service: Cardiovascular;  Laterality: N/A;  . COLON SURGERY    . HAND SURGERY Left   . VASECTOMY  2008       Family History  Problem Relation Age of Onset  . Stroke Mother   . Prostate cancer Father   . ALS Brother   . Breast cancer Daughter     Social History   Tobacco Use  . Smoking status: Former Smoker    Packs/day: 1.00    Years: 10.00    Pack years: 10.00    Types: Cigarettes    Start date: 03/05/1962    Quit date: 03/05/1972    Years since quitting: 47.4   . Smokeless tobacco: Never Used  Vaping Use  . Vaping Use: Never used  Substance Use Topics  . Alcohol use: Yes    Alcohol/week: 1.0 standard drink    Types: 1 Cans of beer per week    Comment: Once a month  . Drug use: No    Home Medications Prior to Admission medications   Medication Sig Start Date End Date Taking? Authorizing Provider  acetaminophen (TYLENOL) 650 MG CR tablet Take 650 mg by mouth every 8 (eight) hours as needed for pain.   Yes [provider]  alfuzosin (UROXATRAL) 10 MG 24 hr tablet Take 10 mg by mouth daily with breakfast.   Yes [provider]  clopidogrel (PLAVIX) 75 MG tablet TAKE 1 TABLET BY MOUTH EVERY DAY Patient taking differently: Take 75 mg by mouth daily.  01/21/19  Yes Jettie Booze, MD  CYANOCOBALAMIN PO Take 1 tablet by mouth daily.   Yes [provider]  ezetimibe (ZETIA) 10 MG tablet TAKE 1 TABLET BY MOUTH EVERY DAY Patient taking differently: Take 10 mg by mouth daily.  02/05/19  Yes Weaver, Scott T, PA-C  Fluocinolone Acetonide 0.01 % OIL Place 3 drops in ear(s) 2 (two) times daily as needed (Itching and fluid).  11/17/15  Yes [provider]  fluticasone (FLONASE) 50 MCG/ACT nasal spray Place 1 spray into the nose daily as needed for allergies.  01/28/13  Yes [provider]  gabapentin (NEURONTIN) 100 MG capsule Take 200 mg by mouth at bedtime.  05/16/17  Yes [provider]  nitroGLYCERIN (NITROSTAT) 0.4 MG SL tablet Place 1 tablet (0.4 mg total) under the tongue every 5 (five) minutes x 3 doses as needed for chest pain. 06/01/15  Yes Strader, [place] M, PA-C  Polyethyl Glycol-Propyl Glycol (SYSTANE) 0.4-0.3 % GEL ophthalmic gel Place 1 application into both eyes 2 (two) times daily as needed.   Yes [provider]  valsartan (DIOVAN) 40 MG tablet Take 1 tablet (40 mg total) by mouth daily. 09/13/15  Yes Tat, Shanon Brow, MD  memantine (NAMENDA) 10 MG tablet TAKE 1 TABLET BY MOUTH TWICE  A DAY Patient taking differently: Take 10 mg by mouth 2 (two) times daily.  03/09/19   Marcial Pacas, MD   Allergies    Bactrim [sulfamethoxazole-trimethoprim], Cardura [doxazosin mesylate], Doxycycline, Flomax [tamsulosin hcl], Levaquin [levofloxacin in d5w], Lipitor [atorvastatin], Penicillins, Sulfa antibiotics, Vytorin [ezetimibe-simvastatin], Lisinopril, Niaspan [niacin], Clindamycin/lincomycin, and Zithromax [azithromycin]  Review of Systems   Review of Systems  Constitutional: Negative.   HENT: Negative.   Respiratory: Negative.   Cardiovascular: Negative.   Gastrointestinal: Negative.   Genitourinary: Negative.   Musculoskeletal: Positive for gait problem. Negative for arthralgias, back pain, joint swelling, myalgias, neck pain and neck stiffness.  Skin: Negative.  Neurological: Negative for dizziness, tremors, seizures, syncope, facial asymmetry, speech difficulty, weakness, light-headedness, numbness and headaches.  All other systems reviewed and are negative.  Physical Exam Updated Vital Signs BP (!) 153/89 (BP Location: Right Arm)   Pulse 60   Temp 97.8 F (36.6 C) (Oral)   Resp 17   Ht 5' 11.5" (1.816 m)   Wt 93 kg   SpO2 96%   BMI 28.19 kg/m   Physical Exam  Physical Exam  Constitutional: Pt is oriented to person, place, and time. Pt appears well-developed and well-nourished. No distress.  HENT:  Head: Normocephalic and atraumatic.  Mouth/Throat: Oropharynx is clear and moist.  Eyes: Conjunctivae and EOM are normal. Pupils are equal, round, and reactive to light. No scleral icterus.  No horizontal, vertical or rotational nystagmus  Neck: Normal range of motion. Neck supple.  Full active and passive ROM without pain No midline or paraspinal tenderness No nuchal rigidity or meningeal signs  Cardiovascular: Normal rate, regular rhythm and intact distal pulses.   Pulmonary/Chest: Effort normal and breath sounds normal. No respiratory distress. Pt has no wheezes. No  rales.  Abdominal: Soft. Bowel sounds are normal. There is no tenderness. There is no rebound and no guarding.  Musculoskeletal: Normal range of motion.  Full range of motion of the T-spine and L-spine with flexion, hyperextension, and lateral flexion. No midline tenderness or stepoffs. No tenderness to palpation of the spinous processes of the T-spine or L-spine. Mild tenderness to palpation of the paraspinous muscles of the L-spine. Negative straight leg raise. Lymphadenopathy:    No cervical adenopathy.  Neurological: Pt. is alert and oriented to person, place, and time. He has normal reflexes. No cranial nerve deficit.  Exhibits normal muscle tone. Coordination normal. Reflex Scores:      Bicep reflexes are 2+ on the right side and 2+ on the left side.      Brachioradialis reflexes are 2+ on the right side and 2+ on the left side.      Patellar reflexes are 2+ on the right side and 2+ on the left side.      Achilles reflexes are 2+ on the right side and 2+ on the left side. Mental Status:  Alert, oriented, thought content appropriate. Speech fluent without evidence of aphasia. Able to follow 2 step commands without difficulty.  Cranial Nerves:  [name]:  Peripheral visual fields grossly normal, pupils equal, round, reactive to light III,IV, VI: ptosis not present, extra-ocular motions intact bilaterally  V,VII: smile symmetric, facial light touch sensation equal VIII: hearing grossly normal bilaterally  IX,X: midline uvula rise  XI: bilateral shoulder shrug equal and strong XII: midline tongue extension  Motor:  5/5 in upper and lower extremities bilaterally including strong and equal grip strength and dorsiflexion/plantar flexion Sensory: Pinprick and light touch normal in all extremities.  Deep Tendon Reflexes: 2+ and symmetric  Cerebellar: normal finger-to-nose with bilateral upper extremities Gait: normal gait and balance CV: distal pulses palpable throughout   Skin: Skin is warm and  dry. No rash noted. Pt is not diaphoretic.  Psychiatric: Pt has a normal mood and affect. Behavior is normal. Judgment and thought content normal.  Nursing note and vitals reviewed.   .ED Results / Procedures / Treatments   Labs (all labs ordered are listed, but only abnormal results are displayed) Labs Reviewed  BASIC METABOLIC PANEL - Abnormal; Notable for the following components:      Result Value   Glucose, Bld 109 (*)  Calcium 8.7 (*)    All other components within normal limits  CBC  TROPONIN I (HIGH SENSITIVITY)  TROPONIN I (HIGH SENSITIVITY)    EKG EKG Interpretation  Date/Time:  Wednesday August 19 2019 21:23:29 EDT Ventricular Rate:  64 PR Interval:  192 QRS Duration: 82 QT Interval:  412 QTC Calculation: 425 R Axis:   31 Text Interpretation: Normal sinus rhythm Possible Anterior infarct , age undetermined Abnormal ECG No acute changes Confirmed by Addison Lank [phone]) on 08/20/2019 12:48:31 AM   Radiology MR BRAIN WO CONTRAST  Result Date: 08/20/2019 CLINICAL DATA:  Ataxia with stroke suspected. Weakness for several days EXAM: MRI HEAD WITHOUT CONTRAST TECHNIQUE: Multiplanar, multiecho pulse sequences of the brain and surrounding structures were obtained without intravenous contrast. COMPARISON:  07/28/2016 FINDINGS: Brain: No acute infarction, hemorrhage, hydrocephalus, extra-axial collection or mass lesion. Excellent brain volume and white matter appearance for age. Vascular: Normal flow voids Skull and upper cervical spine: Normal marrow signal Sinuses/Orbits: Mild patchy mucosal thickening in the paranasal sinuses IMPRESSION: Age normal brain MRI. Electronically Signed   By: Monte Fantasia M.D.   On: 08/20/2019 04:24    Procedures Procedures (including critical care time)  Medications Ordered in ED Medications  sodium chloride flush (NS) 0.9 % injection 3 mL (3 mLs Intravenous Given 08/20/19 0308)  LORazepam (ATIVAN) injection 1 mg (1 mg Intravenous Given  08/20/19 0306)   ED Course  I have reviewed the triage vital signs and the nursing notes.  Pertinent labs & imaging results that were available during my care of the patient were reviewed by me and considered in my medical decision making (see chart for details).  86 year old male presents for evaluation of abnormal gait.  He is afebrile, septic, non-ill-appearing.  States similar symptoms with his prior CVA.  Symptoms initially began 2 weeks ago however states today he felt he had to hold onto things.  Nonfocal neuro exam without deficits.  Heart and lungs cleared.  No chest pain, shortness of breath.   Labs and imaging personally viewed and interpreted:  CBC without leukocytosis, hemoglobin stable at 58.5 Metabolic panel with hyperglycemia to 109 Delta troponin flat Brain MRI without significant abnormality, specifically no CVA EKG without acute changes, No STEMI Orthostatic vital signs negative  Patient reassessed.  Sleeping soundly.  Arousable to voice.  He continues have a nonfocal neuro exam without deficits.  Patient ambulatory to restroom without difficulty without assistance.  Pending MRI  Patient reassessed.  MRI without significant findings.  Discussed results.  Patient continued nonfocal neuro exam but deficits.  Low suspicion for acute intracranial abnormality.  Has good strength bilaterally with intact reflexes.  No bowel or bladder incontinence, saddle paresthesia.  Low suspicion for acute spinal cord process or neurosurgical emergancy as cause of his intermittent unstable gait.  Will have patient follow-up outpatient with neurology whom he already follow with.  The patient has been appropriately medically screened and/or stabilized in the ED. I have low suspicion for any other emergent medical condition which would require further screening, evaluation or treatment in the ED or require inpatient management.  Patient is hemodynamically stable and in no acute distress.  Patient  able to ambulate in department prior to ED.  Evaluation does not show acute pathology that would require ongoing or additional emergent interventions while in the emergency department or further inpatient treatment.  I have discussed the diagnosis with the patient and answered all questions.  Pain is been managed while in the emergency department and patient  has no further complaints prior to discharge.  Patient is comfortable with plan discussed in room and is stable for discharge at this time.  I have discussed strict return precautions for returning to the emergency department.  Patient was encouraged to follow-up with PCP/specialist refer to at discharge.   Discussed with attending, Dr. Leonette Monarch.  Is personally evaluated patient.  Will order MRI brain.  He agrees above treatment, plan and disposition.    MDM Rules/Calculators/A&P                           Final Clinical Impression(s) / ED Diagnoses Final diagnoses:  Unstable gait  Hypertension, unspecified type  Fatigue, unspecified type    Rx / DC Orders ED Discharge Orders    None       Earlyn Sylvan A, PA-C 08/20/19 [place], MD 08/21/19 [phone]

## 2019-08-20 NOTE — Discharge Instructions (Signed)
Follow-up with your neurologist for your gait abnormalities.  Suggest resting and drinking plenty of fluids.  Return for any new or worsening symptoms.

## 2019-08-21 NOTE — ED Provider Notes (Signed)
Attestation: Medical screening examination/treatment/procedure(s) were conducted as a shared visit with non-physician practitioner(s) and myself.  I personally evaluated the patient during the encounter.   Briefly, the patient is a 86 y.o. male with h/o CVA, here for feeling off balance similar to prior CVA.   Vitals:   08/20/19 0308 08/20/19 0447  BP:  (!) 153/89  Pulse:  60  Resp:  17  Temp:  97.8 F (36.6 C)  SpO2: 97% 96%    CONSTITUTIONAL:  well-appearing, NAD NEURO:  Alert and oriented x 3, no focal deficits EYES:  pupils equal and reactive ENT/NECK:  trachea midline, no JVD CARDIO:  Reg  rate, reg rhythm, well-perfused PULM:  None labored breathing GI/GU:  Abdomin non-distended MSK/SPINE:  No gross deformities, no edema SKIN:  no rash, atraumatic PSYCH:  Appropriate speech and behavior   EKG Interpretation  Date/Time:  Wednesday August 19 2019 21:23:29 EDT Ventricular Rate:  64 PR Interval:  192 QRS Duration: 82 QT Interval:  412 QTC Calculation: 425 R Axis:   31 Text Interpretation: Normal sinus rhythm Possible Anterior infarct , age undetermined Abnormal ECG No acute changes Confirmed by Addison Lank [phone]) on 08/20/2019 12:48:31 AM       Work up negative for significant electrolyte derangements or renal insufficiency.  No anemia.  Cardiac markers negative.  CT head and MRI negative.  The patient appears reasonably screened and/or stabilized for discharge and I doubt any other medical condition or other [facility] requiring further screening, evaluation, or treatment in the ED at this time prior to discharge. Safe for discharge with strict return precautions. Fatima Blank, MD 08/21/19 [phone]

## 2019-08-23 NOTE — Progress Notes (Signed)
Cardiology Office Note   Date:  08/24/2019   ID:  [name], DOB 1933-04-22, MRN [id number]  PCP:  Aura Dials, PA-C    No chief complaint on file.  CAD  Wt Readings from Last 3 Encounters:  08/24/19 206 lb 6.4 oz (93.6 kg)  08/20/19 205 lb (93 kg)  02/12/19 215 lb (97.5 kg)       History of Present Illness: Peter [name] is a 86 y.o. male  With h/o CAD/MI.   Prior record showed: "Admitted in 3/17 with inferior STEMI. LHC demonstrated 99% stenosis in the posterior lateral artery treated with a Synergy DES. He had mild to moderate nonobstructive disease elsewhere. EF was preserved by echocardiogram. Given need for upcoming prostate biopsy, it was recommended that he remain on dual antiplatelet therapy for at least 3 months minimum. He had issues with statins in the past and was restarted on low dose Crestor only but has stopped this.  He had some chest pain, dyspnea after the MI in May 2017. Different than his prior angina. Cardiolite in May 2017 showed some inferior scar but no ischemia.   His Crestor was stopped due to concerns about his memoryin 2017. After stoppping the Crestor, he initially felt better, but sx are now worse again. Back pain especially is worse. He did not go on Crestor again, he started pravastatin. He later thought the pravastatin may be responsible for all of his current problems including memory issues, weakness, and urinary retention. He stopped pravastatin.   He had a stroke in July 2017. He has recovered well from the stroke.   He hadwisdomteeth pulled in Dec 2017. He had some low BP after this when he was on pain killers in combination with his prostate med.  He was diagnosed with prostate cancer. He is under active surveillance. He has back pain. If PSA increases, he would get hormone therapy. He is not considering surgery at this time. He has persistent GU pain, but had an accident many years ago at work  in that area."  In 2019, developed an L5 disc problem. He had some stressors at home. His wife's health is deteriorating. He is concerned about his ability to care for her in the long-term. Fortunately, he has a son and daughter who live nearby who are helpful.  Daughter developed cancer in 2020.   He declined memory testing in 2020.   Went to ER in 6/21 for balance issues.  Felt he needed to balance by touching the walls while walking.  He had been working on his back under a house and then would feel dizzy with sitting up.  He was putting in holes in for a fence and had some imbalance.  SImilar gait problems when he ha a CVA years ago.  Per ER: "Work up negative for significant electrolyte derangements or renal insufficiency.  No anemia.  Cardiac markers negative.  CT head and MRI negative."  BP has been stable typically except or day of trip to ER where BP was high. He was nervous as he found out his PSA had increased.  Denies : Chest pain.  Leg edema. Nitroglycerin use. Orthopnea. Palpitations. Paroxysmal nocturnal dyspnea. Shortness of breath. Syncope.     Past Medical History:  Diagnosis Date  . Anxiety   . Arthritis   . CAD (coronary artery disease)    a. 05/2015: STEMI: 99% stenosis of Posterolateral w/ Synergy DES placed.  . Cataract   . Chronic  back pain   . Heart attack ([place])   . History of nuclear stress test    ETT-Myoview 5/17: EF 54%, inf scar, no ischemia; Low Risk  . HLD (hyperlipidemia)   . Hypertension   . Loss of balance   . Prostate cancer ([place])   . Prostate cancer screening    "for several years"  . Prostate disease   . Reflux   . Sepsis [facility]) February 2016   Pt stated he was treated at [facility] for this   . Shoulder pain   . Skin cancer   . Stroke (cerebrum) ([place])   . Stroke [facility]) 09/08/2015    Past Surgical History:  Procedure Laterality Date  . APPENDECTOMY    . CARDIAC CATHETERIZATION N/A 05/31/2015   Procedure: Left Heart Cath and  Coronary Angiography;  Surgeon: Jettie Booze, MD;  Location: [place] CV LAB;  Service: Cardiovascular;  Laterality: N/A;  . CARDIAC CATHETERIZATION N/A 05/31/2015   Procedure: Coronary Stent Intervention;  Surgeon: Jettie Booze, MD;  Location: [place] CV [facility];  Service: Cardiovascular;  Laterality: N/A;  . COLON SURGERY    . HAND SURGERY Left   . VASECTOMY  2008     Current Outpatient Medications  Medication Sig Dispense Refill  . acetaminophen (TYLENOL) 650 MG CR tablet Take 650 mg by mouth every 8 (eight) hours as needed for pain.    Marland Kitchen alfuzosin (UROXATRAL) 10 MG 24 hr tablet Take 10 mg by mouth daily with breakfast.    . clopidogrel (PLAVIX) 75 MG tablet TAKE 1 TABLET BY MOUTH EVERY DAY 90 tablet 2  . CYANOCOBALAMIN PO Take 1 tablet by mouth daily.    Marland Kitchen ezetimibe (ZETIA) 10 MG tablet TAKE 1 TABLET BY MOUTH EVERY DAY 90 tablet 2  . Fluocinolone Acetonide 0.01 % OIL Place 3 drops in ear(s) 2 (two) times daily as needed (Itching and fluid).     . fluticasone (FLONASE) 50 MCG/ACT nasal spray Place 1 spray into the nose daily as needed for allergies.     Marland Kitchen gabapentin (NEURONTIN) 100 MG capsule Take 200 mg by mouth at bedtime.     . nitroGLYCERIN (NITROSTAT) 0.4 MG SL tablet Place 1 tablet (0.4 mg total) under the tongue every 5 (five) minutes x 3 doses as needed for chest pain. 25 tablet 3  . Polyethyl Glycol-Propyl Glycol (SYSTANE) 0.4-0.3 % GEL ophthalmic gel Place 1 application into both eyes 2 (two) times daily as needed.    . valsartan (DIOVAN) 40 MG tablet Take 1 tablet (40 mg total) by mouth daily. 30 tablet 0  . memantine (NAMENDA) 10 MG tablet TAKE 1 TABLET BY MOUTH TWICE A DAY (Patient not taking: Reported on 08/24/2019) 180 tablet 4   No current facility-administered medications for this visit.    Allergies:   Bactrim [sulfamethoxazole-trimethoprim], Cardura [doxazosin mesylate], Doxycycline, Flomax [tamsulosin hcl], Levaquin [levofloxacin in d5w], Lipitor  [atorvastatin], Penicillins, Sulfa antibiotics, Vytorin [ezetimibe-simvastatin], Lisinopril, Niaspan [niacin], Clindamycin/lincomycin, and Zithromax [azithromycin]    Social History:  The patient  reports that he quit smoking about 47 years ago. His smoking use included cigarettes. He started smoking about 57 years ago. He has a 10.00 pack-year smoking history. He has never used smokeless tobacco. He reports current alcohol use of about 1.0 standard drink of alcohol per week. He reports that he does not use drugs.   Family History:  The patient's family history includes ALS in his brother; Breast cancer in his daughter; Prostate cancer in his  father; Stroke in his mother.    ROS:  Please see the history of present illness.   Otherwise, review of systems are positive for increasing PSA.   All other systems are reviewed and negative.    PHYSICAL EXAM: VS:  BP 130/72   Pulse 71   Ht 5' 11.5" (1.816 m)   Wt 206 lb 6.4 oz (93.6 kg)   SpO2 95%   BMI 28.39 kg/m  , BMI Body mass index is 28.39 kg/m. GEN: Well nourished, well developed, in no acute distress  HEENT: normal  Neck: no JVD, carotid bruits, or masses Cardiac: RRR; no murmurs, rubs, or gallops,no edema  Respiratory:  clear to auscultation bilaterally, normal work of breathing GI: soft, nontender, nondistended, + BS MS: no deformity or atrophy  Skin: warm and dry, no rash Neuro:  Strength and sensation are intact Psych: euthymic mood, full affect   EKG:   The ekg ordered 6/16 demonstrates NSR, no ST changes   Recent Labs: 02/12/2019: ALT 9; TSH 1.280 08/19/2019: BUN 10; Creatinine, Ser 0.88; Hemoglobin 14.6; Platelets 224; Potassium 3.6; Sodium 141   Lipid Panel    Component Value Date/Time   CHOL 141 12/04/2016 1049   TRIG 64 12/04/2016 1049   HDL 38 (L) 12/04/2016 1049   CHOLHDL 3.7 12/04/2016 1049   CHOLHDL 4.8 09/09/2015 1122   VLDL 20 09/09/2015 1122   LDLCALC 90 12/04/2016 1049     Other studies  Reviewed: Additional studies/ records that were reviewed today with results demonstrating: ER records reviewed.Age normal Brain MRI in 6/21.    ASSESSMENT AND PLAN:  1. CAD/MI: No angina on medical therapy.  Continue aggressive secondary prevention.  Stay hydrated as I think his dizziness may be in part due to dehydration from working in the heat.  He also does [place] maneuvers.  Needs to leave more time for activities so that he can take his time.   2. Hyperlipidemia:  LDL 90 in 2018.  Intolerant of statins.  He is not interested in taking meds so not checking lipids anymore. 3. HTN: The current medical regimen is effective;  continue present plan and medications. 4. Memory disturbance.  Statins were stopped for this.  Per PMD.   Current medicines are reviewed at length with the patient today.  The patient concerns regarding his medicines were addressed.  The following changes have been made:  No change  Labs/ tests ordered today include:  No orders of the defined types were placed in this encounter.   Recommend 150 minutes/week of aerobic exercise Low fat, low carb, high fiber diet recommended  Disposition:   FU in 1 year   Signed, Larae Grooms, MD  08/24/2019 2:02 PM    [facility] [place], [place], [place]  [address] Phone: [phone]; Fax: [phone]

## 2019-08-24 ENCOUNTER — Encounter: Payer: Self-pay | Admitting: Interventional Cardiology

## 2019-08-24 ENCOUNTER — Ambulatory Visit (INDEPENDENT_AMBULATORY_CARE_PROVIDER_SITE_OTHER): Payer: Medicare Other | Admitting: Interventional Cardiology

## 2019-08-24 ENCOUNTER — Other Ambulatory Visit: Payer: Self-pay

## 2019-08-24 VITALS — BP 130/72 | HR 71 | Ht 71.5 in | Wt 206.4 lb

## 2019-08-24 DIAGNOSIS — I10 Essential (primary) hypertension: Secondary | ICD-10-CM

## 2019-08-24 DIAGNOSIS — E782 Mixed hyperlipidemia: Secondary | ICD-10-CM | POA: Diagnosis not present

## 2019-08-24 DIAGNOSIS — R413 Other amnesia: Secondary | ICD-10-CM

## 2019-08-24 DIAGNOSIS — I25119 Atherosclerotic heart disease of native coronary artery with unspecified angina pectoris: Secondary | ICD-10-CM

## 2019-08-24 NOTE — Patient Instructions (Signed)
Medication Instructions:  Your physician recommends that you continue on your current medications as directed. Please refer to the Current Medication list given to you today.  *If you need a refill on your cardiac medications before your next appointment, please call your pharmacy*   Lab Work: None  If you have labs (blood work) drawn today and your tests are completely normal, you will receive your results only by: Marland Kitchen MyChart Message (if you have MyChart) OR . A paper copy in the mail If you have any lab test that is abnormal or we need to change your treatment, we will call you to review the results.   Testing/Procedures: None   Follow-Up: At [facility], you and your health needs are our priority.  As part of our continuing mission to provide you with exceptional heart care, we have created designated Provider Care Teams.  These Care Teams include your primary Cardiologist (physician) and Advanced Practice Providers (APPs -  Physician Assistants and Nurse Practitioners) who all work together to provide you with the care you need, when you need it.  We recommend signing up for the patient portal called "MyChart".  Sign up information is provided on this After Visit Summary.  MyChart is used to connect with patients for Virtual Visits (Telemedicine).  Patients are able to view lab/test results, encounter notes, upcoming appointments, etc.  Non-urgent messages can be sent to your provider as well.   To learn more about what you can do with MyChart, go to [email or web address].    Your next appointment:   12 month(s)  The format for your next appointment:   In Person  Provider:   You may see Casandra Doffing, MD or one of the following Advanced Practice Providers on your designated Care Team:    Melina Copa, PA-C  Ermalinda Barrios, PA-C    Other Instructions Stay hydrated

## 2019-10-08 ENCOUNTER — Institutional Professional Consult (permissible substitution): Payer: Medicare Other | Admitting: Neurology

## 2019-10-22 ENCOUNTER — Other Ambulatory Visit: Payer: Self-pay | Admitting: Interventional Cardiology

## 2019-11-06 ENCOUNTER — Other Ambulatory Visit: Payer: Self-pay | Admitting: Physician Assistant

## 2020-02-18 ENCOUNTER — Other Ambulatory Visit: Payer: Self-pay | Admitting: Urology

## 2020-02-18 DIAGNOSIS — C61 Malignant neoplasm of prostate: Secondary | ICD-10-CM

## 2020-03-29 ENCOUNTER — Other Ambulatory Visit: Payer: Medicare Other

## 2020-08-03 ENCOUNTER — Other Ambulatory Visit: Payer: Self-pay | Admitting: Interventional Cardiology

## 2020-08-22 NOTE — Progress Notes (Signed)
Cardiology Office Note   Date:  08/23/2020   ID:  Peter [name], DOB Dec 01, 1933, MRN [id number]  PCP:  Peter Dials, PA-C    No chief complaint on file.  CAD  Wt Readings from Last 3 Encounters:  08/23/20 205 lb (93 kg)  08/24/19 206 lb 6.4 oz (93.6 kg)  08/20/19 205 lb (93 kg)       History of Present Illness: Peter [name] is a 87 y.o. male   With h/o CAD/MI.     Prior record showed: " Admitted in 3/17 with inferior STEMI. LHC demonstrated 99% stenosis in the posterior lateral artery treated with a Synergy DES. He had mild to moderate nonobstructive disease elsewhere. EF was preserved by echocardiogram. Given need for upcoming prostate biopsy, it was recommended that he remain on dual antiplatelet therapy for at least 3 months minimum.  He had issues with statins in the past and was restarted on low dose Crestor only but has stopped this.   He had some chest pain, dyspnea after the MI in May 2017.  Different than his prior angina. Cardiolite in May 2017 showed some inferior scar but no ischemia.     His Crestor was stopped due to concerns about his memory in 2017.  After stoppping the Crestor, he initially felt better, but sx are now worse again.  Back pain especially is worse. He did not go on Crestor again, he started pravastatin.  He later thought the pravastatin may be responsible for all of his current problems including memory issues, weakness, and urinary retention.  He stopped pravastatin.     He had a stroke in July 2017.  He has recovered well from the stroke.     He had wisdom teeth pulled in Dec 2017.  He had some low BP after this when he was on pain killers in combination with his prostate med.   He was diagnosed with prostate cancer.  He is under active surveillance.  He has back pain.  If PSA increases, he would get hormone therapy.  He is not considering surgery at this time.  He has persistent GU pain, but had an accident many years ago at work in  that area."   In 2019, developed an L5 disc problem. He had some stressors at home.  His wife's health is deteriorating.  He is concerned about his ability to care for her in the long-term.  Fortunately, he has a son and daughter who live nearby who are helpful.  Daughter developed cancer in 2020.    He declined memory testing in 2020.    Went to ER in 6/21 for balance issues.  Felt he needed to balance by touching the walls while walking.  He had been working on his back under a house and then would feel dizzy with sitting up.  He was putting in holes in for a fence and had some imbalance.  SImilar gait problems when he ha a CVA years ago.   Per ER: "Work up negative for significant electrolyte derangements or renal insufficiency.  No anemia.  Cardiac markers negative.  CT head and MRI negative."   In 2021, He was nervous as he found out his PSA had increased.  He feels his memory has decreased.  He feels his legs are weaker.  He is not walking as much.  He does some work around [employer], and at his daughter's house.  No nonhealing sores on legs or specific  calf pain with walking.   Denies : Chest pain. Dizziness. Leg edema. Nitroglycerin use. Orthopnea. Palpitations. Paroxysmal nocturnal dyspnea.  Syncope.    He is concerned about ending up in a nursing home.  Wife has chronic health issues, and he is worried about the financial burden.     Past Medical History:  Diagnosis Date   Anxiety    Arthritis    CAD (coronary artery disease)    a. 05/2015: STEMI: 99% stenosis of Posterolateral w/ Synergy DES placed.   Cataract    Chronic back pain    Heart attack [facility])    History of nuclear stress test    ETT-Myoview 5/17: EF 54%, inf scar, no ischemia; Low Risk   HLD (hyperlipidemia)    Hypertension    Loss of balance    Prostate cancer ([place])    Prostate cancer screening    "for several years"   Prostate disease    Reflux    Sepsis [facility]) February 2016   Pt stated he was treated at  [facility] for this    Shoulder pain    Skin cancer    Stroke (cerebrum) [facility])    Stroke ([place]) 09/08/2015    Past Surgical History:  Procedure Laterality Date   APPENDECTOMY     CARDIAC CATHETERIZATION N/A 05/31/2015   Procedure: Left Heart Cath and Coronary Angiography;  Surgeon: Jettie Booze, MD;  Location: [place] CV LAB;  Service: Cardiovascular;  Laterality: N/A;   CARDIAC CATHETERIZATION N/A 05/31/2015   Procedure: Coronary Stent Intervention;  Surgeon: Jettie Booze, MD;  Location: [place] CV LAB;  Service: Cardiovascular;  Laterality: N/A;   COLON SURGERY     HAND SURGERY Left    VASECTOMY  2008     Current Outpatient Medications  Medication Sig Dispense Refill   acetaminophen (TYLENOL) 650 MG CR tablet Take 650 mg by mouth every 8 (eight) hours as needed for pain.     alfuzosin (UROXATRAL) 10 MG 24 hr tablet Take 10 mg by mouth daily with breakfast.     clopidogrel (PLAVIX) 75 MG tablet TAKE 1 TABLET BY MOUTH EVERY DAY 90 tablet 3   ezetimibe (ZETIA) 10 MG tablet TAKE 1 TABLET BY MOUTH EVERY DAY 90 tablet 2   Fluocinolone Acetonide 0.01 % OIL Place 3 drops in ear(s) 2 (two) times daily as needed (Itching and fluid).      fluticasone (FLONASE) 50 MCG/ACT nasal spray Place 1 spray into the nose daily as needed for allergies.      gabapentin (NEURONTIN) 100 MG capsule Take 200 mg by mouth at bedtime.      memantine (NAMENDA) 10 MG tablet TAKE 1 TABLET BY MOUTH TWICE A DAY 180 tablet 4   nitroGLYCERIN (NITROSTAT) 0.4 MG SL tablet Place 1 tablet (0.4 mg total) under the tongue every 5 (five) minutes x 3 doses as needed for chest pain. 25 tablet 3   Polyethyl Glycol-Propyl Glycol (SYSTANE) 0.4-0.3 % GEL ophthalmic gel Place 1 application into both eyes 2 (two) times daily as needed.     valsartan (DIOVAN) 40 MG tablet Take 1 tablet (40 mg total) by mouth daily. 30 tablet 0   No current facility-administered medications for this visit.    Allergies:    Bactrim [sulfamethoxazole-trimethoprim], Cardura [doxazosin mesylate], Doxycycline, Flomax [tamsulosin hcl], Levaquin [levofloxacin in d5w], Lipitor [atorvastatin], Penicillins, Sulfa antibiotics, Vytorin [ezetimibe-simvastatin], Lisinopril, Niaspan [niacin], Clindamycin/lincomycin, and Zithromax [azithromycin]    Social History:  The patient  reports that he  quit smoking about 48 years ago. His smoking use included cigarettes. He started smoking about 58 years ago. He has a 10.00 pack-year smoking history. He has never used smokeless tobacco. He reports current alcohol use of about 1.0 standard drink of alcohol per week. He reports that he does not use drugs.   Family History:  The patient's family history includes ALS in his brother; Breast cancer in his daughter; Prostate cancer in his father; Stroke in his mother.    ROS:  Please see the history of present illness.   Otherwise, review of systems are positive for decreased memory- increased stress.   All other systems are reviewed and negative.    PHYSICAL EXAM: VS:  BP 130/70   Pulse (!) 57   Ht 5' 11.5" (1.816 m)   Wt 205 lb (93 kg)   SpO2 95%   BMI 28.19 kg/m  , BMI Body mass index is 28.19 kg/m. GEN: Well nourished, well developed, in no acute distress HEENT: normal Neck: no JVD, carotid bruits, or masses Cardiac: RRR; no murmurs, rubs, or gallops,no edema  Respiratory:  clear to auscultation bilaterally, normal work of breathing GI: soft, nontender, nondistended, + BS MS: no deformity or atrophy Skin: warm and dry, no rash; scratches on skin Neuro:  Strength and sensation are intact Psych: euthymic mood, full affect   EKG:   The ekg ordered today demonstrates NSR, no ST changes   Recent Labs: No results found for requested labs within last 8760 hours.   Lipid Panel    Component Value Date/Time   CHOL 141 12/04/2016 1049   TRIG 64 12/04/2016 1049   HDL 38 (L) 12/04/2016 1049   CHOLHDL 3.7 12/04/2016 1049   CHOLHDL  4.8 09/09/2015 1122   VLDL 20 09/09/2015 1122   LDLCALC 90 12/04/2016 1049     Other studies Reviewed: Additional studies/ records that were reviewed today with results demonstrating: PSA increasing- no treatment needed as of yet.  SLow rate of rise.   ASSESSMENT AND PLAN:  CAD/Old MI: No angina.  Continue medical therapy. Continue clopidogrel.  No bleeding issues.  Hyperlipidemia: Continue Zetia. He has declined statins.  LDL 90 in 2020. Will check lipids.  HTN: The current medical regimen is effective;  continue present plan and medications.  Avoid processed foods.  Memory disturbance: he stopped statin for this in the past.  Follows with neurology.  Occasional EtOH use, beer and wine.   Elevated blood sugar in 2020.  Needs A1C followed.   Current medicines are reviewed at length with the patient today.  The patient concerns regarding his medicines were addressed.  The following changes have been made:  No change  Labs/ tests ordered today include: check labs when fasting  Orders Placed This Encounter  Procedures   CBC   Comp Met (CMET)   HgB A1c   Lipid Profile   EKG 12-Lead     Recommend 150 minutes/week of aerobic exercise Low fat, low carb, high fiber diet recommended  Disposition:   FU in 1 year   Signed, Larae Grooms, MD  08/23/2020 9:50 AM    [facility] [place] [address], [place], [place]  [address] Phone: [phone]; Fax: [phone]

## 2020-08-23 ENCOUNTER — Encounter: Payer: Self-pay | Admitting: Interventional Cardiology

## 2020-08-23 ENCOUNTER — Ambulatory Visit (INDEPENDENT_AMBULATORY_CARE_PROVIDER_SITE_OTHER): Payer: Medicare Other | Admitting: Interventional Cardiology

## 2020-08-23 ENCOUNTER — Other Ambulatory Visit: Payer: Self-pay

## 2020-08-23 VITALS — BP 130/70 | HR 57 | Ht 71.5 in | Wt 205.0 lb

## 2020-08-23 DIAGNOSIS — R739 Hyperglycemia, unspecified: Secondary | ICD-10-CM

## 2020-08-23 DIAGNOSIS — E782 Mixed hyperlipidemia: Secondary | ICD-10-CM

## 2020-08-23 DIAGNOSIS — R413 Other amnesia: Secondary | ICD-10-CM

## 2020-08-23 DIAGNOSIS — I25119 Atherosclerotic heart disease of native coronary artery with unspecified angina pectoris: Secondary | ICD-10-CM

## 2020-08-23 DIAGNOSIS — I10 Essential (primary) hypertension: Secondary | ICD-10-CM | POA: Diagnosis not present

## 2020-08-23 DIAGNOSIS — I252 Old myocardial infarction: Secondary | ICD-10-CM | POA: Diagnosis not present

## 2020-08-23 DIAGNOSIS — R7309 Other abnormal glucose: Secondary | ICD-10-CM

## 2020-08-23 NOTE — Patient Instructions (Signed)
Medication Instructions:  Your physician recommends that you continue on your current medications as directed. Please refer to the Current Medication list given to you today.  *If you need a refill on your cardiac medications before your next appointment, please call your pharmacy*   Lab Work: Your physician recommends that you return for lab work on July 7,2022.  This is fasting.  Nothing to eat or drink after midnight prior to lab work.  You may have water and you can take your medications.  The lab opens at 7:30 AM  If you have labs (blood work) drawn today and your tests are completely normal, you will receive your results only by: [place] (if you have MyChart) OR A paper copy in the mail If you have any lab test that is abnormal or we need to change your treatment, we will call you to review the results.   Testing/Procedures: none   Follow-Up: At [facility], you and your health needs are our priority.  As part of our continuing mission to provide you with exceptional heart care, we have created designated Provider Care Teams.  These Care Teams include your primary Cardiologist (physician) and Advanced Practice Providers (APPs -  Physician Assistants and Nurse Practitioners) who all work together to provide you with the care you need, when you need it.  We recommend signing up for the patient portal called "MyChart".  Sign up information is provided on this After Visit Summary.  MyChart is used to connect with patients for Virtual Visits (Telemedicine).  Patients are able to view lab/test results, encounter notes, upcoming appointments, etc.  Non-urgent messages can be sent to your provider as well.   To learn more about what you can do with MyChart, go to [email or web address].    Your next appointment:   12 month(s)  The format for your next appointment:   In Person  Provider:   You may see Dr Irish Lack or one of the following Advanced Practice Providers on your  designated Care Team:   Melina Copa, PA-C Ermalinda Barrios, PA-C   Other Instructions

## 2020-09-08 ENCOUNTER — Other Ambulatory Visit: Payer: Self-pay

## 2020-09-08 ENCOUNTER — Other Ambulatory Visit: Payer: Medicare Other

## 2020-09-08 DIAGNOSIS — E782 Mixed hyperlipidemia: Secondary | ICD-10-CM

## 2020-09-08 DIAGNOSIS — I25119 Atherosclerotic heart disease of native coronary artery with unspecified angina pectoris: Secondary | ICD-10-CM

## 2020-09-08 DIAGNOSIS — I252 Old myocardial infarction: Secondary | ICD-10-CM

## 2020-09-08 DIAGNOSIS — I10 Essential (primary) hypertension: Secondary | ICD-10-CM

## 2020-09-08 DIAGNOSIS — R7309 Other abnormal glucose: Secondary | ICD-10-CM

## 2020-09-08 LAB — LIPID PANEL
Chol/HDL Ratio: 3.2 ratio (ref 0.0–5.0)
Cholesterol, Total: 134 mg/dL (ref 100–199)
HDL: 42 mg/dL (ref >39)
LDL Chol Calc (NIH): 75 mg/dL (ref 0–99)
Triglycerides: 86 mg/dL (ref 0–149)
VLDL Cholesterol Cal: 17 mg/dL (ref 5–40)

## 2020-09-08 LAB — COMPREHENSIVE METABOLIC PANEL
ALT: 10 IU/L (ref 0–44)
AST: 14 IU/L (ref 0–40)
Albumin/Globulin Ratio: 1.8 (ref 1.2–2.2)
Albumin: 4 g/dL (ref 3.6–4.6)
Alkaline Phosphatase: 69 IU/L (ref 44–121)
BUN/Creatinine Ratio: 18 (ref 10–24)
BUN: 17 mg/dL (ref 8–27)
Bilirubin Total: 0.6 mg/dL (ref 0.0–1.2)
CO2: 22 mmol/L (ref 20–29)
Calcium: 8.5 mg/dL — ABNORMAL LOW (ref 8.6–10.2)
Chloride: 107 mmol/L — ABNORMAL HIGH (ref 96–106)
Creatinine, Ser: 0.97 mg/dL (ref 0.76–1.27)
Globulin, Total: 2.2 g/dL (ref 1.5–4.5)
Glucose: 97 mg/dL (ref 65–99)
Potassium: 4 mmol/L (ref 3.5–5.2)
Sodium: 144 mmol/L (ref 134–144)
Total Protein: 6.2 g/dL (ref 6.0–8.5)
eGFR: 77 mL/min/{1.73_m2} (ref >59)

## 2020-09-08 LAB — CBC
Hematocrit: 41.7 % (ref 37.5–51.0)
Hemoglobin: 14.2 g/dL (ref 13.0–17.7)
MCH: 32.6 pg (ref 26.6–33.0)
MCHC: 34.1 g/dL (ref 31.5–35.7)
MCV: 96 fL (ref 79–97)
Platelets: 220 10*3/uL (ref 150–450)
RBC: 4.36 x10E6/uL (ref 4.14–5.80)
RDW: 11.8 % (ref 11.6–15.4)
WBC: 6.2 10*3/uL (ref 3.4–10.8)

## 2020-09-08 LAB — HEMOGLOBIN A1C
Est. average glucose Bld gHb Est-mCnc: 117 mg/dL
Hgb A1c MFr Bld: 5.7 % — ABNORMAL HIGH (ref 4.8–5.6)

## 2020-10-20 ENCOUNTER — Other Ambulatory Visit: Payer: Self-pay | Admitting: Interventional Cardiology

## 2021-05-04 ENCOUNTER — Other Ambulatory Visit: Payer: Self-pay | Admitting: Interventional Cardiology

## 2021-08-05 ENCOUNTER — Other Ambulatory Visit: Payer: Self-pay | Admitting: Interventional Cardiology

## 2021-08-14 ENCOUNTER — Other Ambulatory Visit: Payer: Self-pay | Admitting: Interventional Cardiology

## 2021-10-10 ENCOUNTER — Other Ambulatory Visit: Payer: Self-pay | Admitting: Interventional Cardiology

## 2021-10-20 ENCOUNTER — Other Ambulatory Visit: Payer: Self-pay | Admitting: Interventional Cardiology

## 2021-11-04 ENCOUNTER — Other Ambulatory Visit: Payer: Self-pay | Admitting: Interventional Cardiology

## 2021-11-13 NOTE — Progress Notes (Signed)
Follow-up   Office Visit    Patient Name: Peter [name] Date of Encounter: 11/13/2021  Primary Care Provider:  Aura Dials, PA-C Primary Cardiologist:  Larae Grooms, MD Primary Electrophysiologist: None  Chief Complaint    Peter [name] is a 88 y.o. male with PMH of CAD s/p inferior MI 3/17 with DES to right posterior lateral artery, HTN, HLD, CVA (2017), elevated PSA who presents today for follow-up of coronary artery disease.  Past Medical History    Past Medical History:  Diagnosis Date   Anxiety    Arthritis    CAD (coronary artery disease)    a. 05/2015: STEMI: 99% stenosis of Posterolateral w/ Synergy DES placed.   Cataract    Chronic back pain    Heart attack [facility])    History of nuclear stress test    ETT-Myoview 5/17: EF 54%, inf scar, no ischemia; Low Risk   HLD (hyperlipidemia)    Hypertension    Loss of balance    Prostate cancer ([place])    Prostate cancer screening    "for several years"   Prostate disease    Reflux    Sepsis [facility]) February 2016   Pt stated he was treated at [facility] for this    Shoulder pain    Skin cancer    Stroke (cerebrum) [facility])    Stroke ([place]) 09/08/2015   Past Surgical History:  Procedure Laterality Date   APPENDECTOMY     CARDIAC CATHETERIZATION N/A 05/31/2015   Procedure: Left Heart Cath and Coronary Angiography;  Surgeon: Jettie Booze, MD;  Location: [place] CV LAB;  Service: Cardiovascular;  Laterality: N/A;   CARDIAC CATHETERIZATION N/A 05/31/2015   Procedure: Coronary Stent Intervention;  Surgeon: Jettie Booze, MD;  Location: [place] CV LAB;  Service: Cardiovascular;  Laterality: N/A;   COLON SURGERY     HAND SURGERY Left    VASECTOMY  2008    Allergies  Allergies  Allergen Reactions   Bactrim [Sulfamethoxazole-Trimethoprim] Itching   Cardura [Doxazosin Mesylate] Other (See Comments)    unspecified   Doxycycline Itching   Flomax [Tamsulosin Hcl] Other (See Comments)     unspecified unspecified   Levaquin [Levofloxacin In D5w] Swelling and Other (See Comments)    Joint swelling   Lipitor [Atorvastatin] Nausea And Vomiting   Penicillins Swelling    2/19 Pt states he had Penicillin challenge that was negative for reaction. Has patient had a PCN reaction causing immediate rash, facial/tongue/throat swelling, SOB or lightheadedness with hypotension: No Has patient had a PCN reaction causing severe rash involving mucus membranes or skin necrosis: No Has patient had a PCN reaction that required hospitalization: No Has patient had a PCN reaction occurring within the last 10 years: Yes If all of the above answers are "NO", then may proceed with Ceph   Sulfa Antibiotics Itching   Vytorin [Ezetimibe-Simvastatin] Nausea And Vomiting   Lisinopril Other (See Comments)    unknown   Niaspan [Niacin] Swelling and Other (See Comments)    Joint swelling   Clindamycin/Lincomycin Rash   Zithromax [Azithromycin] Rash    History of Present Illness    Peter [name] has a PMH of is a 88 year old male with the above mention past medical history who presents today for follow-up of coronary artery disease.  Peter [name] was initially seen in the ED on 05/2015 for inferior STEMI.  LHC was completed and DES x1 was placed to posterior lateral artery.  He was treated with DAPT for 1 year with ASA and Brilinta.  2D echo was completed 09/2015 with an EF of 55-60% and grade 1 DD with no RWMA and structurally normal valves.  Patient developed myalgias on Crestor and switch to pravastatin.  He developed memory issues on pravastatin and eventually stopped.  He is currently on Zetia.  He suffered a CVA in 2017 located in the left pons and recovered well.  He was last seen in 08/2020 for follow-up.  During visit patient was doing well with no bleeding issues with Plavix.  He was noted to have some memory disturbance and is currently being followed by neurology.  Mr.  [name] presents today for  annual follow-up alone.  Since last being seen in the office patient reports he has not had any cardiac complaints or changes.  He is quite active and has been remodeling an old house for his daughter.  He is doing physical work including digging out stumps and hanging siding.  He is being followed by Dr. Rigoberto Noel, MD for neurology.  He does have memory issues that he states are not getting worse but still bothersome.  He is also the primary caregiver for his wife who has severe glaucoma.  He admits to some lapses in taking his medications but states he never goes 1 day without remembering.  His blood pressure today was elevated on initial check at 156/86 and was 140/82 on recheck.  Patient denies chest pain, palpitations, dyspnea, PND, orthopnea, nausea, vomiting, dizziness, syncope, edema, weight gain, or early satiety.    Current Outpatient Medications  Medication Sig Dispense Refill   acetaminophen (TYLENOL) 650 MG CR tablet Take 650 mg by mouth every 8 (eight) hours as needed for pain.     alfuzosin (UROXATRAL) 10 MG 24 hr tablet Take 10 mg by mouth daily with breakfast.     clopidogrel (PLAVIX) 75 MG tablet TAKE 1 TABLET BY MOUTH EVERY DAY 15 tablet 0   ezetimibe (ZETIA) 10 MG tablet Take 1 tablet (10 mg total) by mouth daily. 15 tablet 0   Fluocinolone Acetonide 0.01 % OIL Place 3 drops in ear(s) 2 (two) times daily as needed (Itching and fluid).      fluticasone (FLONASE) 50 MCG/ACT nasal spray Place 1 spray into the nose daily as needed for allergies.      gabapentin (NEURONTIN) 100 MG capsule Take 200 mg by mouth at bedtime.      memantine (NAMENDA) 10 MG tablet TAKE 1 TABLET BY MOUTH TWICE A DAY 180 tablet 4   nitroGLYCERIN (NITROSTAT) 0.4 MG SL tablet Place 1 tablet (0.4 mg total) under the tongue every 5 (five) minutes x 3 doses as needed for chest pain. 25 tablet 3   Polyethyl Glycol-Propyl Glycol (SYSTANE) 0.4-0.3 % GEL ophthalmic gel Place 1 application into both eyes 2 (two) times  daily as needed.     valsartan (DIOVAN) 40 MG tablet Take 1 tablet (40 mg total) by mouth daily. 30 tablet 0   No current facility-administered medications for this visit.     Review of Systems  Please see the history of present illness.    (+) Memory (+) Lower extremity arthritis  All other systems reviewed and are otherwise negative except as noted above.  Physical Exam    Wt Readings from Last 3 Encounters:  08/23/20 205 lb (93 kg)  08/24/19 206 lb 6.4 oz (93.6 kg)  08/20/19 205 lb (93 kg)   BD:ZHGDJ were no vitals filed  for this visit.,There is no height or weight on file to calculate BMI.  Constitutional:      Appearance: Healthy appearance. Not in distress.  Neck:     Vascular: JVD normal.  Pulmonary:     Effort: Pulmonary effort is normal.     Breath sounds: No wheezing. No rales. Diminished in the bases Cardiovascular:     Normal rate. Regular rhythm. Normal S1. Normal S2.      Murmurs: There is no murmur.  Edema:    Peripheral edema absent.  Abdominal:     Palpations: Abdomen is soft non tender. There is no hepatomegaly.  Skin:    General: Skin is warm and dry.  Neurological:     General: No focal deficit present.     Mental Status: Alert and oriented to person, place and time.     Cranial Nerves: Cranial nerves are intact.  EKG/LABS/Other Studies Reviewed    ECG personally reviewed by me today -sinus rhythm with normal axis and rate of 61 bpm with no acute changes Lab Results  Component Value Date   WBC 6.2 09/08/2020   HGB 14.2 09/08/2020   HCT 41.7 09/08/2020   MCV 96 09/08/2020   PLT 220 09/08/2020   Lab Results  Component Value Date   CREATININE 0.97 09/08/2020   BUN 17 09/08/2020   NA 144 09/08/2020   K 4.0 09/08/2020   CL 107 (H) 09/08/2020   CO2 22 09/08/2020   Lab Results  Component Value Date   ALT 10 09/08/2020   AST 14 09/08/2020   ALKPHOS 69 09/08/2020   BILITOT 0.6 09/08/2020   Lab Results  Component Value Date   CHOL 134  09/08/2020   HDL 42 09/08/2020   LDLCALC 75 09/08/2020   TRIG 86 09/08/2020   CHOLHDL 3.2 09/08/2020    Lab Results  Component Value Date   HGBA1C 5.7 (H) 09/08/2020    Assessment & Plan    1.  Coronary artery disease: - CAD s/p inferior MI 3/17 with DES to right posterior lateral artery -Today patient reports no angina or cardiac complaints -He denies any bleeding complications with Plavix -Continue GDMT with Plavix 75 mg and ezetimibe 10 mg  2.  Hypertension: -Blood pressure today was elevated initially at 158/86 and was 140/82 on recheck -Continue Diovan 40 mg daily  3.  History of CVA: -Patient stable with no residual and is currently followed by neurology at [place] Plavix as noted above  4.  Hyperlipidemia: -Follow-up with PCP in -Continue Zetia 10 mg daily  5.  DM type [name]: -Patient reports some neuropathic pain in lower extremities with cool sensations that resolved with activity -Patient encouraged to follow-up with PCP regarding symptom management   Disposition: Follow-up with Larae Grooms, MD or APP in 12 months    Medication Adjustments/Labs and Tests Ordered: Current medicines are reviewed at length with the patient today.  Concerns regarding medicines are outlined above.   Signed, Mable Fill, Marissa Nestle, NP 11/13/2021, 4:33 PM [place]

## 2021-11-17 ENCOUNTER — Ambulatory Visit: Payer: Medicare Other | Attending: Nurse Practitioner | Admitting: Nurse Practitioner

## 2021-11-17 ENCOUNTER — Other Ambulatory Visit: Payer: Self-pay | Admitting: Interventional Cardiology

## 2021-11-17 ENCOUNTER — Encounter: Payer: Self-pay | Admitting: Nurse Practitioner

## 2021-11-17 VITALS — BP 140/82 | HR 61 | Ht 71.0 in | Wt 199.4 lb

## 2021-11-17 DIAGNOSIS — E782 Mixed hyperlipidemia: Secondary | ICD-10-CM | POA: Diagnosis not present

## 2021-11-17 DIAGNOSIS — R413 Other amnesia: Secondary | ICD-10-CM | POA: Insufficient documentation

## 2021-11-17 DIAGNOSIS — I25119 Atherosclerotic heart disease of native coronary artery with unspecified angina pectoris: Secondary | ICD-10-CM | POA: Diagnosis present

## 2021-11-17 NOTE — Patient Instructions (Signed)
Medication Instructions:   Your physician recommends that you continue on your current medications as directed. Please refer to the Current Medication list given to you today.  *If you need a refill on your cardiac medications before your next appointment, please call your pharmacy*   Lab Work:  [place]   If you have labs (blood work) drawn today and your tests are completely normal, you will receive your results only by: [place] (if you have MyChart) OR A paper copy in the mail If you have any lab test that is abnormal or we need to change your treatment, we will call you to review the results.   Testing/Procedures: NONE ORDERED  TODAY    Follow-Up: At [facility], you and your health needs are our priority.  As part of our continuing mission to provide you with exceptional heart care, we have created designated Provider Care Teams.  These Care Teams include your primary Cardiologist (physician) and Advanced Practice Providers (APPs -  Physician Assistants and Nurse Practitioners) who all work together to provide you with the care you need, when you need it.  We recommend signing up for the patient portal called "MyChart".  Sign up information is provided on this After Visit Summary.  MyChart is used to connect with patients for Virtual Visits (Telemedicine).  Patients are able to view lab/test results, encounter notes, upcoming appointments, etc.  Non-urgent messages can be sent to your provider as well.   To learn more about what you can do with MyChart, go to [email or web address].    Your next appointment:   1 year(s)  The format for your next appointment:   In Person  Provider:   Larae Grooms, MD     Other Instructions  Important Information About Sugar

## 2022-01-05 ENCOUNTER — Other Ambulatory Visit: Payer: Self-pay | Admitting: Interventional Cardiology

## 2022-01-05 ENCOUNTER — Telehealth: Payer: Self-pay | Admitting: Interventional Cardiology

## 2022-01-05 MED ORDER — EZETIMIBE 10 MG PO TABS: 10.0000 mg | ORAL_TABLET | Freq: Every day | ORAL | 3 refills | Status: DC

## 2022-01-05 NOTE — Telephone Encounter (Signed)
*  STAT* If patient is at the pharmacy, call can be transferred to refill team.   1. Which medications need to be refilled? (please list name of each medication and dose if known) ezetimibe (ZETIA) 10 MG tablet   2. Which pharmacy/location (including street and city if local pharmacy) is medication to be sent to?   [id number]- [place], [place] - [address]   3. Do they need a 30 day or 90 day supply? [address]

## 2022-01-05 NOTE — Telephone Encounter (Signed)
Pt's medication was sent to pt's pharmacy as requested. Confirmation received.  °

## 2022-02-07 ENCOUNTER — Telehealth: Payer: Self-pay | Admitting: Interventional Cardiology

## 2022-02-07 NOTE — Telephone Encounter (Signed)
Pt c/o BP issue: STAT if pt c/o blurred vision, one-sided weakness or slurred speech  1. What are your last 5 BP readings? 190/90; 140/80; 160/90; 180/90  2. Are you having any other symptoms (ex. Dizziness, headache, blurred vision, passed out)? no  3. What is your BP issue? Elevated

## 2022-02-07 NOTE — Telephone Encounter (Signed)
Left message for patient to call back  

## 2022-02-08 NOTE — Telephone Encounter (Signed)
Pt returning call

## 2022-02-08 NOTE — Telephone Encounter (Signed)
I spoke with patient.  He reports he did a great deal of physical activity over the weekend. On Monday he was having a lot of back pain. He normally checks BP several times a week at home.  On Tuesday his back pain was improving but his BP went up to 190 and then came back down.  He reports he has memory problems and does not recall all the BP readings on Tuesday. On Wednesday he checked his BP many times. It got over 200 at one time.  Eventually it came down. He reports the following readings- 12/6-4:15 PM-175/77          5:55 PM-145/72          8:10 PM-139/71 12/7- 3:10 AM-150/77 when he got up to use bathroom            7:15 AM-141/76           7:45 AM-132/73  Patient was not having any chest pain during time of elevated readings.  He does report a great deal of stress.  He is asking when he should go to ED for elevated BP.  I told him if BP is running over 200 he should go to ED/Urgent Care.  He should be seen in ED if he is having chest pain Patient advised to watch salt intake.  I asked him to take BP after he had been sitting calmly and rested for at least 30 minutes and not to check right after eating. I asked patient to continue to check BP and keep track of readings.  He will call office if he has elevated readings again.

## 2022-02-12 NOTE — Progress Notes (Unsigned)
Cardiology Office Note   Date:  02/13/2022   ID:  Peter [name], DOB [birth year], MRN [id number]  PCP:  Aura Dials, PA-C    No chief complaint on file.  CAD  Wt Readings from Last 3 Encounters:  02/13/22 199 lb 12.8 oz (90.6 kg)  11/17/21 199 lb 6.4 oz (90.4 kg)  08/23/20 205 lb (93 kg)       History of Present Illness: Peter [name] is a 88 y.o. male  With h/o CAD/MI.     Prior record showed: " Admitted in 3/17 with inferior STEMI. LHC demonstrated 99% stenosis in the posterior lateral artery treated with a Synergy DES. He had mild to moderate nonobstructive disease elsewhere. EF was preserved by echocardiogram. Given need for upcoming prostate biopsy, it was recommended that he remain on dual antiplatelet therapy for at least 3 months minimum.  He had issues with statins in the past and was restarted on low dose Crestor only but has stopped this.   He had some chest pain, dyspnea after the MI in May 2017.  Different than his prior angina. Cardiolite in May 2017 showed some inferior scar but no ischemia.     His Crestor was stopped due to concerns about his memory in 2017.  After stoppping the Crestor, he initially felt better, but sx are now worse again.  Back pain especially is worse. He did not go on Crestor again, he started pravastatin.  He later thought the pravastatin may be responsible for all of his current problems including memory issues, weakness, and urinary retention.  He stopped pravastatin.     He had a stroke in July 2017.  He has recovered well from the stroke.     He had wisdom teeth pulled in Dec 2017.  He had some low BP after this when he was on pain killers in combination with his prostate med.   He was diagnosed with prostate cancer.  He is under active surveillance.  He has back pain.  If PSA increases, he would get hormone therapy.  He is not considering surgery at this time.  He has persistent GU pain, but had an accident many years ago  at work in that area."   In 2019, developed an L5 disc problem. He had some stressors at home.  His wife's health is deteriorating.  He is concerned about his ability to care for her in the long-term.  Fortunately, he has a son and daughter who live nearby who are helpful.  Daughter developed cancer in 2020.    He declined memory testing in 2020.    Went to ER in 6/21 for balance issues.  Felt he needed to balance by touching the walls while walking.  He had been working on his back under a house and then would feel dizzy with sitting up.  He was putting in holes in for a fence and had some imbalance.  SImilar gait problems when he ha a CVA years ago.   Per ER: "Work up negative for significant electrolyte derangements or renal insufficiency.  No anemia.  Cardiac markers negative.  CT head and MRI negative."   In 2021, He was nervous as he found out his PSA had increased.  He felt his memory has decreased.  He feels his legs are weaker.  He is not walking as much.  He does some work around [employer], and at his daughter's house.  No nonhealing sores on legs or  specific calf pain with walking.    In 2022: "He is concerned about ending up in a nursing home.  Wife has chronic health issues, and he is worried about the financial burden. "  In December 2023, he had an episode of back pain after significantiWork in the yard.  He noted elevated blood pressures, some to the 798X systolic.  He is followed his blood pressures since then and they have been trending down into the 130s.  He called in to the office to get further guidance.  He was told to limit salt.  He was told to check his blood pressures when he was resting rather than active.  Patient did mention that he has some memory issues and could not remember all of his blood pressure readings.   He went to an ER, not [facility], on [address].  Metoprolol was added 50 mg BID.  He has noted some slow HR at home, < 50 bpm.  Currently Denies : Chest  pain. Dizziness. Leg edema. Nitroglycerin use. Orthopnea. Palpitations. Paroxysmal nocturnal dyspnea. Shortness of breath. Syncope.    Just feels fatigued for a while.  Back pain is improving.   Past Medical History:  Diagnosis Date   Anxiety    Arthritis    CAD (coronary artery disease)    a. 05/2015: STEMI: 99% stenosis of Posterolateral w/ Synergy DES placed.   Cataract    Chronic back pain    Heart attack [facility])    History of nuclear stress test    ETT-Myoview 5/17: EF 54%, inf scar, no ischemia; Low Risk   HLD (hyperlipidemia)    Hypertension    Loss of balance    Prostate cancer ([place])    Prostate cancer screening    "for several years"   Prostate disease    Reflux    Sepsis [facility]) February 2016   Pt stated he was treated at [facility] for this    Shoulder pain    Skin cancer    Stroke (cerebrum) [facility])    Stroke ([place]) 09/08/2015    Past Surgical History:  Procedure Laterality Date   APPENDECTOMY     CARDIAC CATHETERIZATION N/A 05/31/2015   Procedure: Left Heart Cath and Coronary Angiography;  Surgeon: Jettie Booze, MD;  Location: [place] CV LAB;  Service: Cardiovascular;  Laterality: N/A;   CARDIAC CATHETERIZATION N/A 05/31/2015   Procedure: Coronary Stent Intervention;  Surgeon: Jettie Booze, MD;  Location: [place] CV LAB;  Service: Cardiovascular;  Laterality: N/A;   COLON SURGERY     HAND SURGERY Left    VASECTOMY  2008     Current Outpatient Medications  Medication Sig Dispense Refill   acetaminophen (TYLENOL) 650 MG CR tablet Take 650 mg by mouth every 8 (eight) hours as needed for pain.     alfuzosin (UROXATRAL) 10 MG 24 hr tablet Take 10 mg by mouth daily with breakfast.     clopidogrel (PLAVIX) 75 MG tablet TAKE 1 TABLET BY MOUTH EVERY DAY 90 tablet 3   ezetimibe (ZETIA) 10 MG tablet Take 1 tablet (10 mg total) by mouth daily. 90 tablet 3   Fluocinolone Acetonide 0.01 % OIL Place 3 drops in ear(s) 2 (two) times daily as needed (Itching  and fluid).      fluticasone (FLONASE) 50 MCG/ACT nasal spray Place 1 spray into the nose daily as needed for allergies.      gabapentin (NEURONTIN) 100 MG capsule Take 200 mg by mouth at bedtime.  hydrocortisone 2.5 % ointment APPLY TO AFFECTED AREAS EVERY DAY AS NEEDED     metoprolol tartrate (LOPRESSOR) 50 MG tablet Take 50 mg by mouth 2 (two) times daily.     nitroGLYCERIN (NITROSTAT) 0.4 MG SL tablet Place 1 tablet (0.4 mg total) under the tongue every 5 (five) minutes x 3 doses as needed for chest pain. 25 tablet 3   Polyethyl Glycol-Propyl Glycol (SYSTANE) 0.4-0.3 % GEL ophthalmic gel Place 1 application into both eyes 2 (two) times daily as needed.     triamcinolone cream (KENALOG) 0.1 % Apply topically 2 (two) times daily as needed.     valsartan (DIOVAN) 40 MG tablet Take 1 tablet (40 mg total) by mouth daily. 30 tablet 0   donepezil (ARICEPT) 10 MG tablet Take 10 mg by mouth at bedtime.     memantine (NAMENDA) 10 MG tablet TAKE 1 TABLET BY MOUTH TWICE A DAY 180 tablet 4   polyethylene glycol (MIRALAX / GLYCOLAX) 17 g packet Take by mouth.     No current facility-administered medications for this visit.    Allergies:   Bactrim [sulfamethoxazole-trimethoprim], Cardura [doxazosin mesylate], Doxycycline, Flomax [tamsulosin hcl], Levaquin [levofloxacin in d5w], Lipitor [atorvastatin], Penicillins, Sulfa antibiotics, Vytorin [ezetimibe-simvastatin], Lisinopril, Niaspan [niacin], Clindamycin/lincomycin, and Zithromax [azithromycin]    Social History:  The patient  reports that he quit smoking about 49 years ago. His smoking use included cigarettes. He started smoking about 59 years ago. He has a 10.00 pack-year smoking history. He has never used smokeless tobacco. He reports current alcohol use of about 1.0 standard drink of alcohol per week. He reports that he does not use drugs.   Family History:  The patient's family history includes ALS in his brother; Breast cancer in his daughter;  Prostate cancer in his father; Stroke in his mother.    ROS:  Please see the history of present illness.   Otherwise, review of systems are positive for recent back pain- has used alcohol on occasion to help with pain.   All other systems are reviewed and negative.    PHYSICAL EXAM: VS:  BP 124/68   Pulse (!) 46   Ht '5\' 11"'$  (1.803 m)   Wt 199 lb 12.8 oz (90.6 kg)   SpO2 96%   BMI 27.87 kg/m  , BMI Body mass index is 27.87 kg/m. GEN: Well nourished, well developed, in no acute distress HEENT: normal Neck: no JVD, carotid bruits, or masses Cardiac: bradycardic; no murmurs, rubs, or gallops,no edema  Respiratory:  clear to auscultation bilaterally, normal work of breathing GI: soft, nontender, nondistended, + BS MS: no deformity or atrophy Skin: warm and dry, no rash Neuro:  Strength and sensation are intact Psych: euthymic mood, full affect   EKG:   The ekg ordered today demonstrates sinus bradycardia   Recent Labs: No results found for requested labs within last 365 days.   Lipid Panel    Component Value Date/Time   CHOL 134 09/08/2020 1017   TRIG 86 09/08/2020 1017   HDL 42 09/08/2020 1017   CHOLHDL 3.2 09/08/2020 1017   CHOLHDL 4.8 09/09/2015 1122   VLDL 20 09/09/2015 1122   LDLCALC 75 09/08/2020 1017     Other studies Reviewed: Additional studies/ records that were reviewed today with results demonstrating: labs reviewed.   ASSESSMENT AND PLAN:  CAD/old MI: No clear angina. Continue aggressive secondary prevention. No CHF sx. Hyperlipidemia: LDL 75.  Continue Zetia.  Hypertension: Given the slow heart rate, will decrease metoprolol to 25  mg twice a day.  If the heart rate stays low, could consider stopping metoprolol and increasing valsartan to 80 mg daily.  He has noted some fatigue.  No syncope or presyncope.  QRS is narrow.  I do not think his fatigue is related to bradycardia. Fatigue has been a longstanding issue. Will check TSH.  I think it is more likely  age-related deconditioning. Elevated blood sugar: A1C 5.7.  Memory disturbance: meds changed.  Wife also has some memory problems.  He has had perceived side effects with several meds in the past.  In general, he wants to try tostop as many meds as possible.  Ideally, he wants to stop the metoprolol after BP stabilizes.  The plan is for him to move to [place] with his daughter at some point.   Current medicines are reviewed at length with the patient today.  The patient concerns regarding his medicines were addressed.  The following changes have been made:  No change  Labs/ tests ordered today include:  No orders of the defined types were placed in this encounter.   Recommend 150 minutes/week of aerobic exercise Low fat, low carb, high fiber diet recommended  Disposition:   F/U - he will send in readings from home  Signed, Larae Grooms, MD  02/13/2022 9:12 AM    [facility] [address], [place], [place]  [address] Phone: [phone]; Fax: [phone]

## 2022-02-13 ENCOUNTER — Encounter: Payer: Self-pay | Admitting: Interventional Cardiology

## 2022-02-13 ENCOUNTER — Ambulatory Visit: Payer: Medicare Other | Attending: Interventional Cardiology | Admitting: Interventional Cardiology

## 2022-02-13 VITALS — BP 124/68 | HR 46 | Ht 71.0 in | Wt 199.8 lb

## 2022-02-13 DIAGNOSIS — I25119 Atherosclerotic heart disease of native coronary artery with unspecified angina pectoris: Secondary | ICD-10-CM | POA: Insufficient documentation

## 2022-02-13 DIAGNOSIS — I252 Old myocardial infarction: Secondary | ICD-10-CM | POA: Insufficient documentation

## 2022-02-13 DIAGNOSIS — R413 Other amnesia: Secondary | ICD-10-CM | POA: Diagnosis present

## 2022-02-13 DIAGNOSIS — R7309 Other abnormal glucose: Secondary | ICD-10-CM | POA: Diagnosis present

## 2022-02-13 DIAGNOSIS — R5383 Other fatigue: Secondary | ICD-10-CM | POA: Insufficient documentation

## 2022-02-13 DIAGNOSIS — I10 Essential (primary) hypertension: Secondary | ICD-10-CM | POA: Insufficient documentation

## 2022-02-13 DIAGNOSIS — E782 Mixed hyperlipidemia: Secondary | ICD-10-CM | POA: Insufficient documentation

## 2022-02-13 MED ORDER — METOPROLOL TARTRATE 25 MG PO TABS: 25.0000 mg | ORAL_TABLET | Freq: Two times a day (BID) | ORAL | 3 refills | Status: DC

## 2022-02-13 NOTE — Patient Instructions (Signed)
Medication Instructions:  Your physician has recommended you make the following change in your medication: Decrease metoprolol tartrate to 25 mg by  mouth twice daily  *If you need a refill on your cardiac medications before your next appointment, please call your pharmacy*   Lab Work: Lab work to be done today--CMET, CBC, TSH If you have labs (blood work) drawn today and your tests are completely normal, you will receive your results only by: [place] (if you have MyChart) OR A paper copy in the mail If you have any lab test that is abnormal or we need to change your treatment, we will call you to review the results.   Testing/Procedures: none   Follow-Up: At [facility], you and your health needs are our priority.  As part of our continuing mission to provide you with exceptional heart care, we have created designated Provider Care Teams.  These Care Teams include your primary Cardiologist (physician) and Advanced Practice Providers (APPs -  Physician Assistants and Nurse Practitioners) who all work together to provide you with the care you need, when you need it.  We recommend signing up for the patient portal called "MyChart".  Sign up information is provided on this After Visit Summary.  MyChart is used to connect with patients for Virtual Visits (Telemedicine).  Patients are able to view lab/test results, encounter notes, upcoming appointments, etc.  Non-urgent messages can be sent to your provider as well.   To learn more about what you can do with MyChart, go to [email or web address].    Your next appointment:   9 month(s)  The format for your next appointment:   In Person  Provider:   Larae Grooms, MD     Other Instructions Check BP and heart rate at home.  Call or send readings through my chart   Phone number for my chart help desk is [phone]  Important Information About Sugar

## 2022-02-14 LAB — COMPREHENSIVE METABOLIC PANEL
ALT: 12 IU/L (ref 0–44)
AST: 14 IU/L (ref 0–40)
Albumin/Globulin Ratio: 2.1 (ref 1.2–2.2)
Albumin: 4.2 g/dL (ref 3.7–4.7)
Alkaline Phosphatase: 59 IU/L (ref 44–121)
BUN/Creatinine Ratio: 16 (ref 10–24)
BUN: 16 mg/dL (ref 8–27)
Bilirubin Total: 0.5 mg/dL (ref 0.0–1.2)
CO2: 25 mmol/L (ref 20–29)
Calcium: 9.1 mg/dL (ref 8.6–10.2)
Chloride: 108 mmol/L — ABNORMAL HIGH (ref 96–106)
Creatinine, Ser: 0.97 mg/dL (ref 0.76–1.27)
Globulin, Total: 2 g/dL (ref 1.5–4.5)
Glucose: 101 mg/dL — ABNORMAL HIGH (ref 70–99)
Potassium: 4.2 mmol/L (ref 3.5–5.2)
Sodium: 144 mmol/L (ref 134–144)
Total Protein: 6.2 g/dL (ref 6.0–8.5)
eGFR: 76 mL/min/{1.73_m2} (ref >59)

## 2022-02-14 LAB — CBC
Hematocrit: 45.5 % (ref 37.5–51.0)
Hemoglobin: 15.1 g/dL (ref 13.0–17.7)
MCH: 31.9 pg (ref 26.6–33.0)
MCHC: 33.2 g/dL (ref 31.5–35.7)
MCV: 96 fL (ref 79–97)
Platelets: 266 10*3/uL (ref 150–450)
RBC: 4.74 x10E6/uL (ref 4.14–5.80)
RDW: 11.8 % (ref 11.6–15.4)
WBC: 7 10*3/uL (ref 3.4–10.8)

## 2022-02-14 LAB — TSH: TSH: 1.4 u[IU]/mL (ref 0.450–4.500)

## 2022-02-16 ENCOUNTER — Encounter: Payer: Self-pay | Admitting: Interventional Cardiology

## 2022-02-20 NOTE — Telephone Encounter (Signed)
OK. Would continue to monitor for now.  Checking twice a day is sufficient.    JV

## 2022-02-21 ENCOUNTER — Telehealth: Payer: Self-pay | Admitting: *Deleted

## 2022-02-21 NOTE — Telephone Encounter (Signed)
OK to stop metoprolol.  COntinue to monitor BPs.  If consistently over 140/90, will have to adjust nonrate slowing BP meds.

## 2022-02-21 NOTE — Telephone Encounter (Signed)
I called patient and reviewed lab results with him. He reports heart rate is still running low.  Readings from today-- 7 AM-53 8:30 AM-120/61, 60 2:30 PM-144/70, 58  12/19 readings- 11:10 AM-49 11:15 AM-49 6:45 PM-53 6:50 PM-49  He is concerned about heart rates.  Lopressor was decreased at last office visit.  Will send to Dr Irish Lack for review/recommendations. I told patient for now he could hold lopressor if heart rate was 50 or less until Dr Irish Lack advises.

## 2022-02-21 NOTE — Telephone Encounter (Signed)
Patient has not read my chart message.  I gave him message.

## 2022-02-22 NOTE — Telephone Encounter (Signed)
Spoke with pt and advised of Dr Hassell Done order to discontinue his Metoprolol and continue to monitor BP.  Pt is aware to notify the office is BP is consistently above 140/90.  He is aware to contact on call MD if issues when the office is closed.  He does not typically use his MyChart but reports his wife/daughter does on occasion.

## 2022-04-13 ENCOUNTER — Telehealth: Payer: Self-pay | Admitting: Cardiology

## 2022-04-13 NOTE — Telephone Encounter (Signed)
Patient called in reporting he was concerned over possible irregular HR. He has been monitoring his blood pressures over the past 2 days, readings--> 130/86, 127/86, 132/76. Reports his BP monitor has a symbol for irregular HR on it. Has noticed the past couple of readings have showed that symbol. He's had no palpitations, dizziness, chest pain. Advised I would route to PCP cardiologist to determine whether cardiac monitor would be indicated. Otherwise, continue on meds without change. He voiced understanding and thanked me for callback.

## 2022-04-16 ENCOUNTER — Ambulatory Visit: Payer: Medicare Other | Attending: Interventional Cardiology

## 2022-04-16 ENCOUNTER — Telehealth: Payer: Self-pay | Admitting: Interventional Cardiology

## 2022-04-16 ENCOUNTER — Telehealth: Payer: Self-pay

## 2022-04-16 DIAGNOSIS — R002 Palpitations: Secondary | ICD-10-CM

## 2022-04-16 DIAGNOSIS — I25119 Atherosclerotic heart disease of native coronary artery with unspecified angina pectoris: Secondary | ICD-10-CM

## 2022-04-16 NOTE — Telephone Encounter (Signed)
Patient wanted to know if he can resume normal activities, he still is having abnormal heart beats. Denies any other symptoms. Order placed for Zio patch per Dr. Irish Lack.  MD and nurse not in the office today. Will forward to MD and nurse for response.

## 2022-04-16 NOTE — Telephone Encounter (Signed)
Follow Up:    Patient had called on Friday. He talked to Peter [name] on Friday  evening.  He received another  call from someone on Saturday(did not remember who it was) He was told they would talk to Dr Irish Lack and get back to him. He said he was still waiting to hear something.

## 2022-04-16 NOTE — Telephone Encounter (Signed)
See above phone note. Monitor has been ordered.

## 2022-04-16 NOTE — Progress Notes (Unsigned)
Enrolled for [employer] to mail a ZIO XT long term holter monitor to the patients address on file.  

## 2022-04-16 NOTE — Telephone Encounter (Signed)
I sent a note to someone saying to order 2 week Zio patch if he was still having symptoms.

## 2022-04-19 NOTE — Telephone Encounter (Signed)
I spoke with patient and let him know monitor was being mailed to him.  Instructions on how to wear discussed with patient. Patient made aware he can continue normal activities as long as he is feeling well.  He is not having any issues with heart rate or rhythm at this time.

## 2022-04-20 DIAGNOSIS — I25119 Atherosclerotic heart disease of native coronary artery with unspecified angina pectoris: Secondary | ICD-10-CM | POA: Diagnosis not present

## 2022-04-20 DIAGNOSIS — R002 Palpitations: Secondary | ICD-10-CM | POA: Diagnosis not present

## 2022-05-04 ENCOUNTER — Telehealth: Payer: Self-pay | Admitting: Physician Assistant

## 2022-05-04 NOTE — Telephone Encounter (Signed)
Patient paged after hour answering service complaining of blood pressure spikes.  He has blood pressure has been in the 120s to 130s recently.  After exercise this afternoon, his blood pressure went up to low 150s.  He repeated his blood pressure with your hour and was still in the 150s, therefore he took extra 40 mg of valsartan.  While on the phone, he appears to be anxious, he rechecked his blood pressure and it went up to 180s.  He denies any strokelike symptoms.  He does have a history of CVA and CAD.  I asked him to lay still and the rest for the next hour or 2 to see if his blood pressure will come down.  As long as his systolic blood pressure does not spike over 200, we can try to manage his blood pressure as outpatient.  If systolic blood pressure does spike to over 200 or if he develop any strokelike symptoms, he need to go to the emergency room.  He denies any recent chest pain or worsening dyspnea.

## 2022-06-14 ENCOUNTER — Telehealth: Payer: Self-pay | Admitting: Interventional Cardiology

## 2022-06-14 NOTE — Telephone Encounter (Signed)
Patient stated yesterday he was moving boxes in his garage and later on he was experienced chest pain. The pain has subsided, as of today his chest is sore. Denies  any other symptoms. As of this morning, patient stated his chest still feels a little sore. He did check his blood pressure.  This AM 141/83, HR 57, 1525 @ 164/91 HR 61,   rechecked it less than 10 min 162/79 HR 49, 1600 he took 2  500 mg Tylenol. 1614 patient took valsartan 153/88 HR 68  . Patient stated he will like to be seen by MD or APP due to his cardiac history.  Patient is scheduled for OV on 06/15/22. Explained ED precautions , pt voiced understanding.

## 2022-06-14 NOTE — Telephone Encounter (Signed)
Pt c/o of Chest Pain: STAT if CP now or developed within 24 hours  1. Are you having CP right now? yes  2. Are you experiencing any other symptoms (ex. SOB, nausea, vomiting, sweating)? High BP  3. How long have you been experiencing CP? Last night  4. Is your CP continuous or coming and going? Continuous   5. Have you taken Nitroglycerin? no ?  Pt c/o BP issue: STAT if pt c/o blurred vision, one-sided weakness or slurred speech  1. What are your last 5 BP readings? 141/83 BP, 164/91 around 3pm, 167/98  3:35pm, 162/72, around 4pm 153/88 HR 68  2. Are you having any other symptoms (ex. Dizziness, headache, blurred vision, passed out)? Chest pain  3. What is your BP issue? Patient states his BP has been high and he has been having chest pain  Patient states he has been having chest pain since yesterday evening. He says he did do some work yesterday, but did not feel like he hurt himself. He says he has taken tylenol for his chest pain and also took valsartan 10 min ago for the high BP.

## 2022-06-15 ENCOUNTER — Ambulatory Visit: Payer: Medicare Other | Attending: Internal Medicine | Admitting: Internal Medicine

## 2022-06-15 ENCOUNTER — Encounter: Payer: Self-pay | Admitting: Internal Medicine

## 2022-06-15 VITALS — BP 126/72 | HR 79 | Ht 71.0 in | Wt 193.2 lb

## 2022-06-15 DIAGNOSIS — I25119 Atherosclerotic heart disease of native coronary artery with unspecified angina pectoris: Secondary | ICD-10-CM

## 2022-06-15 MED ORDER — NITROGLYCERIN 0.4 MG SL SUBL: 0.4000 mg | SUBLINGUAL_TABLET | SUBLINGUAL | 3 refills | Status: AC | PRN

## 2022-06-15 NOTE — Progress Notes (Signed)
Patient Care Team: Lucila Maine as PCP - General (Physician Assistant) Corky Crafts, MD as PCP - Cardiology (Cardiology) Lavada Mesi, MD as Consulting Physician (Family Medicine) Kerrin Champagne, MD (Inactive) as Consulting Physician (Orthopedic Surgery)   HPI  Peter [name] is a 89 y.o. male seen as an add-on in the office today with complaints of chest pain.  He has had 72 hours of a persistent achy knot in his mid epigastrium area.  Not aggravated by activity, perhaps worse with recumbency.  Huge amount of stress in life.  With this discomfort, he noted his blood pressure which would go up as he continue to check it repeatedly to [place] of 160.  (In this regard he has had recorded blood pressures over 190)  History of hypertension, prior stroke, remote coronary artery disease with stenting 2017 with a prior inferior wall MI  DATE TEST EF   7/17 Echo   55-60  %               Date Cr K Hgb  12/23 0.97 4.2 15.1         Event recorder 3/14 demonstrated rare PACs and rare PVCs symptoms were infrequently associated with PVCs  Records and Results Reviewed   Past Medical History:  Diagnosis Date   Anxiety    Arthritis    CAD (coronary artery disease)    a. 05/2015: STEMI: 99% stenosis of Posterolateral w/ Synergy DES placed.   Cataract    Chronic back pain    Heart attack    History of nuclear stress test    ETT-Myoview 5/17: EF 54%, inf scar, no ischemia; Low Risk   HLD (hyperlipidemia)    Hypertension    Loss of balance    Prostate cancer    Prostate cancer screening    "for several years"   Prostate disease    Reflux    Sepsis February 2016   Pt stated he was treated at [facility] for this    Shoulder pain    Skin cancer    Stroke 09/08/2015   Stroke (cerebrum)     Past Surgical History:  Procedure Laterality Date   APPENDECTOMY     CARDIAC CATHETERIZATION N/A 05/31/2015   Procedure: Left Heart Cath and Coronary  Angiography;  Surgeon: Corky Crafts, MD;  Location: [facility] INVASIVE CV LAB;  Service: Cardiovascular;  Laterality: N/A;   CARDIAC CATHETERIZATION N/A 05/31/2015   Procedure: Coronary Stent Intervention;  Surgeon: Corky Crafts, MD;  Location: [facility] INVASIVE CV LAB;  Service: Cardiovascular;  Laterality: N/A;   COLON SURGERY     HAND SURGERY Left    VASECTOMY  2008    Current Meds  Medication Sig   acetaminophen (TYLENOL) 650 MG CR tablet Take 650 mg by mouth every 8 (eight) hours as needed for pain.   alfuzosin (UROXATRAL) 10 MG 24 hr tablet Take 10 mg by mouth daily with breakfast.   clopidogrel (PLAVIX) 75 MG tablet TAKE 1 TABLET BY MOUTH EVERY DAY   donepezil (ARICEPT) 10 MG tablet Take 10 mg by mouth at bedtime.   ezetimibe (ZETIA) 10 MG tablet Take 1 tablet (10 mg total) by mouth daily.   Fluocinolone Acetonide 0.01 % OIL Place 3 drops in ear(s) 2 (two) times daily as needed (Itching and fluid).    fluticasone (FLONASE) 50 MCG/ACT nasal spray Place 1 spray into the nose daily as needed for allergies.  hydrocortisone 2.5 % ointment APPLY TO AFFECTED AREAS EVERY DAY AS NEEDED   nitroGLYCERIN (NITROSTAT) 0.4 MG SL tablet Place 1 tablet (0.4 mg total) under the tongue every 5 (five) minutes x 3 doses as needed for chest pain.   Polyethyl Glycol-Propyl Glycol (SYSTANE) 0.4-0.3 % GEL ophthalmic gel Place 1 application into both eyes 2 (two) times daily as needed.   polyethylene glycol (MIRALAX / GLYCOLAX) 17 g packet Take by mouth.   triamcinolone cream (KENALOG) 0.1 % Apply topically 2 (two) times daily as needed.   valsartan (DIOVAN) 40 MG tablet Take 1 tablet (40 mg total) by mouth daily.   [DISCONTINUED] gabapentin (NEURONTIN) 100 MG capsule Take 200 mg by mouth at bedtime.    [DISCONTINUED] memantine (NAMENDA) 10 MG tablet TAKE 1 TABLET BY MOUTH TWICE A DAY    Allergies  Allergen Reactions   Bactrim [Sulfamethoxazole-Trimethoprim] Itching   Cardura [Doxazosin Mesylate] Other  (See Comments)    unspecified   Doxycycline Itching   Flomax [Tamsulosin Hcl] Other (See Comments)    unspecified unspecified   Levaquin [Levofloxacin In D5w] Swelling and Other (See Comments)    Joint swelling   Lipitor [Atorvastatin] Nausea And Vomiting   Penicillins Swelling    2/19 Pt states he had Penicillin challenge that was negative for reaction. Has patient had a PCN reaction causing immediate rash, facial/tongue/throat swelling, SOB or lightheadedness with hypotension: No Has patient had a PCN reaction causing severe rash involving mucus membranes or skin necrosis: No Has patient had a PCN reaction that required hospitalization: No Has patient had a PCN reaction occurring within the last 10 years: Yes If all of the above answers are "NO", then may proceed with Ceph   Sulfa Antibiotics Itching   Vytorin [Ezetimibe-Simvastatin] Nausea And Vomiting   Lisinopril Other (See Comments)    unknown   Niaspan [Niacin] Swelling and Other (See Comments)    Joint swelling   Clindamycin/Lincomycin Rash   Zithromax [Azithromycin] Rash      Review of Systems negative except from HPI and PMH  Physical Exam BP 126/72   Pulse 79   Ht 5\' 11"  (1.803 m)   Wt 193 lb 3.2 oz (87.6 kg)   SpO2 94%   BMI 26.95 kg/m  Well developed and well nourished in no acute distress HENT normal E scleral and icterus clear Neck Supple JVP flat; carotids brisk and full Clear to ausculation Regular rate and rhythm, no murmurs gallops or rub Soft with active bowel sounds No clubbing cyanosis  Edema Alert and oriented, grossly normal motor and sensory function Skin Warm and Dry  ECG sinus rhythm at 79 19/08/43 Atrial bigeminy  CrCl cannot be calculated (Patient's most recent lab result is older than the maximum 21 days allowed.).   Assessment and  Plan  Chest pain almost certainly not cardiac  Coronary artery disease with remote stenting  GE reflux disease  Significant psychosocial  stress  Atrial bigeminy  Chest pain with mostly noncardiac features occurring in the context of remote coronary disease.  ECG is not concerning for an acute coronary syndrome great deal of stress.  Suggested the importance of managing the stress which has been a lifelong problem but worse of late.    Blood pressure I think is responsive to the stress.  Today some reasonably good control and will continue on his clopidogrel      Current medicines are reviewed at length with the patient today .  The patient does not  have concerns  regarding medicines.

## 2022-06-15 NOTE — Patient Instructions (Signed)
Medication Instructions:  Your physician recommends that you continue on your current medications as directed. Please refer to the Current Medication list given to you today.  *If you need a refill on your cardiac medications before your next appointment, please call your pharmacy*   Lab Work: None ordered.  If you have labs (blood work) drawn today and your tests are completely normal, you will receive your results only by: MyChart Message (if you have MyChart) OR A paper copy in the mail If you have any lab test that is abnormal or we need to change your treatment, we will call you to review the results.   Testing/Procedures: None ordered.    Follow-Up: At [facility], you and your health needs are our priority.  As part of our continuing mission to provide you with exceptional heart care, we have created designated Provider Care Teams.  These Care Teams include your primary Cardiologist (physician) and Advanced Practice Providers (APPs -  Physician Assistants and Nurse Practitioners) who all work together to provide you with the care you need, when you need it.  We recommend signing up for the patient portal called "MyChart".  Sign up information is provided on this After Visit Summary.  MyChart is used to connect with patients for Virtual Visits (Telemedicine).  Patients are able to view lab/test results, encounter notes, upcoming appointments, etc.  Non-urgent messages can be sent to your provider as well.   To learn more about what you can do with MyChart, go to [email or web address].    Your next appointment:   Follow up as planned - If you develop active chest pain please seek further care in the Emergency Department.

## 2022-10-26 ENCOUNTER — Other Ambulatory Visit (HOSPITAL_COMMUNITY): Payer: Self-pay | Admitting: Urology

## 2022-10-26 DIAGNOSIS — C61 Malignant neoplasm of prostate: Secondary | ICD-10-CM

## 2022-10-31 NOTE — Progress Notes (Unsigned)
Cardiology Office Note   Date:  11/01/2022   ID:  Peter [name], DOB 01-28-1934, MRN [id number]  PCP:  Teena Irani, PA-C    No chief complaint on file.  CAD  Wt Readings from Last 3 Encounters:  11/01/22 199 lb (90.3 kg)  06/15/22 193 lb 3.2 oz (87.6 kg)  02/13/22 199 lb 12.8 oz (90.6 kg)       History of Present Illness: Peter [name] is a 89 y.o. male   With h/o CAD/MI.     Prior record showed: " Admitted in 3/17 with inferior STEMI. LHC demonstrated 99% stenosis in the posterior lateral artery treated with a Synergy DES. He had mild to moderate nonobstructive disease elsewhere. EF was preserved by echocardiogram. Given need for upcoming prostate biopsy, it was recommended that he remain on dual antiplatelet therapy for at least 3 months minimum.  He had issues with statins in the past and was restarted on low dose Crestor only but has stopped this.   He had some chest pain, dyspnea after the MI in May 2017.  Different than his prior angina. Cardiolite in May 2017 showed some inferior scar but no ischemia.     His Crestor was stopped due to concerns about his memory in 2017.  After stoppping the Crestor, he initially felt better, but sx are now worse again.  Back pain especially is worse. He did not go on Crestor again, he started pravastatin.  He later thought the pravastatin may be responsible for all of his current problems including memory issues, weakness, and urinary retention.  He stopped pravastatin.     He had a stroke in July 2017.  He has recovered well from the stroke.     He had wisdom teeth pulled in Dec 2017.  He had some low BP after this when he was on pain killers in combination with his prostate med.   He was diagnosed with prostate cancer.  He is under active surveillance.  He has back pain.  If PSA increases, he would get hormone therapy.  He is not considering surgery at this time.  He has persistent GU pain, but had an accident many years  ago at work in that area."   In 2019, developed an L5 disc problem. He had some stressors at home.  His wife's health is deteriorating.  He is concerned about his ability to care for her in the long-term.  Fortunately, he has a son and daughter who live nearby who are helpful.  Daughter developed cancer in 2020.    He declined memory testing in 2020.    Went to ER in 6/21 for balance issues.  Felt he needed to balance by touching the walls while walking.  He had been working on his back under a house and then would feel dizzy with sitting up.  He was putting in holes in for a fence and had some imbalance.  SImilar gait problems when he ha a CVA years ago.   Per ER: "Work up negative for significant electrolyte derangements or renal insufficiency.  No anemia.  Cardiac markers negative.  CT head and MRI negative."   In 2021, He was nervous as he found out his PSA had increased.  He felt his memory has decreased.  He feels his legs are weaker.  He is not walking as much.  He does some work around [employer], and at his daughter's house.  No nonhealing sores on legs  or specific calf pain with walking.    In 2022: "He is concerned about ending up in a nursing home.  Wife has chronic health issues, and he is worried about the financial burden. "   In December 2023, he had an episode of back pain after significantiWork in the yard.  He noted elevated blood pressures, some to the 190s systolic.  He is followed his blood pressures since then and they have been trending down into the 130s.  He called in to the office to get further guidance.  He was told to limit salt.  He was told to check his blood pressures when he was resting rather than active.  Patient did mention that he has some memory issues and could not remember all of his blood pressure readings.    He went to an ER, not [facility], on [address].  Metoprolol was added 50 mg BID.  He has noted some slow HR at home, < 50 bpm.  He has had issues  with chronic fatigue for years.  As of 2024, BP at home has beebn well controlled.   120's/70s.    Denies : Chest pain. Dizziness. Leg edema. Nitroglycerin use. Orthopnea. Palpitations. Paroxysmal nocturnal dyspnea. Shortness of breath. Syncope.    Exercising less of late due to back pain.    Past Medical History:  Diagnosis Date   Anxiety    Arthritis    CAD (coronary artery disease)    a. 05/2015: STEMI: 99% stenosis of Posterolateral w/ Synergy DES placed.   Cataract    Chronic back pain    Heart attack [facility])    History of nuclear stress test    ETT-Myoview 5/17: EF 54%, inf scar, no ischemia; Low Risk   HLD (hyperlipidemia)    Hypertension    Loss of balance    Prostate cancer (HCC)    Prostate cancer screening    "for several years"   Prostate disease    Reflux    Sepsis [facility]) February 2016   Pt stated he was treated at [facility] for this    Shoulder pain    Skin cancer    Stroke (cerebrum) [facility])    Stroke (HCC) 09/08/2015    Past Surgical History:  Procedure Laterality Date   APPENDECTOMY     CARDIAC CATHETERIZATION N/A 05/31/2015   Procedure: Left Heart Cath and Coronary Angiography;  Surgeon: Corky Crafts, MD;  Location: [facility] INVASIVE CV LAB;  Service: Cardiovascular;  Laterality: N/A;   CARDIAC CATHETERIZATION N/A 05/31/2015   Procedure: Coronary Stent Intervention;  Surgeon: Corky Crafts, MD;  Location: [facility] INVASIVE CV LAB;  Service: Cardiovascular;  Laterality: N/A;   COLON SURGERY     HAND SURGERY Left    VASECTOMY  2008     Current Outpatient Medications  Medication Sig Dispense Refill   acetaminophen (TYLENOL) 650 MG CR tablet Take 650 mg by mouth every 8 (eight) hours as needed for pain.     alfuzosin (UROXATRAL) 10 MG 24 hr tablet Take 10 mg by mouth daily with breakfast.     clopidogrel (PLAVIX) 75 MG tablet TAKE 1 TABLET BY MOUTH EVERY DAY 90 tablet 3   donepezil (ARICEPT) 10 MG tablet Take 10 mg by mouth at bedtime.     ezetimibe  (ZETIA) 10 MG tablet Take 1 tablet (10 mg total) by mouth daily. 90 tablet 3   Fluocinolone Acetonide 0.01 % OIL Place 3 drops in ear(s) 2 (two) times daily as needed (Itching  and fluid).      fluticasone (FLONASE) 50 MCG/ACT nasal spray Place 1 spray into the nose daily as needed for allergies.      hydrocortisone 2.5 % ointment APPLY TO AFFECTED AREAS EVERY DAY AS NEEDED     nitroGLYCERIN (NITROSTAT) 0.4 MG SL tablet Place 1 tablet (0.4 mg total) under the tongue every 5 (five) minutes x 3 doses as needed for chest pain. 25 tablet 3   Polyethyl Glycol-Propyl Glycol (SYSTANE) 0.4-0.3 % GEL ophthalmic gel Place 1 application into both eyes 2 (two) times daily as needed.     polyethylene glycol (MIRALAX / GLYCOLAX) 17 g packet Take by mouth.     triamcinolone cream (KENALOG) 0.1 % Apply topically 2 (two) times daily as needed.     valsartan (DIOVAN) 40 MG tablet Take 1 tablet (40 mg total) by mouth daily. 30 tablet 0   No current facility-administered medications for this visit.    Allergies:   Bactrim [sulfamethoxazole-trimethoprim], Cardura [doxazosin mesylate], Doxycycline, Flomax [tamsulosin hcl], Levaquin [levofloxacin in d5w], Lipitor [atorvastatin], Penicillins, Sulfa antibiotics, Vytorin [ezetimibe-simvastatin], Lisinopril, Niaspan [niacin], Clindamycin/lincomycin, and Zithromax [azithromycin]    Social History:  The patient  reports that he quit smoking about 50 years ago. His smoking use included cigarettes. He started smoking about 60 years ago. He has a 10 pack-year smoking history. He has never used smokeless tobacco. He reports current alcohol use of about 1.0 standard drink of alcohol per week. He reports that he does not use drugs.   Family History:  The patient's family history includes ALS in his brother; Breast cancer in his daughter; Prostate cancer in his father; Stroke in his mother.    ROS:  Please see the history of present illness.   Otherwise, review of systems are  positive for back pain.   All other systems are reviewed and negative.    PHYSICAL EXAM: VS:  BP 122/62   Pulse 70   Ht 5\' 11"  (1.803 m)   Wt 199 lb (90.3 kg)   SpO2 94%   BMI 27.75 kg/m  , BMI Body mass index is 27.75 kg/m. GEN: Well nourished, well developed, in no acute distress HEENT: normal Neck: no JVD, carotid bruits, or masses Cardiac: RRR; no murmurs, rubs, or gallops,no edema  Respiratory:  clear to auscultation bilaterally, normal work of breathing GI: soft, nontender, nondistended, + BS MS: no deformity or atrophy Skin: warm and dry, no rash Neuro:  Strength and sensation are intact Psych: euthymic mood, full affect   Recent Labs: 02/13/2022: ALT 12; BUN 16; Creatinine, Ser 0.97; Hemoglobin 15.1; Platelets 266; Potassium 4.2; Sodium 144; TSH 1.400   Lipid Panel    Component Value Date/Time   CHOL 134 09/08/2020 1017   TRIG 86 09/08/2020 1017   HDL 42 09/08/2020 1017   CHOLHDL 3.2 09/08/2020 1017   CHOLHDL 4.8 09/09/2015 1122   VLDL 20 09/09/2015 1122   LDLCALC 75 09/08/2020 1017     Other studies Reviewed: Additional studies/ records that were reviewed today with results demonstrating: labs reviewed.   ASSESSMENT AND PLAN:  CAD/Old MI: Status post inferior MI with posterolateral stent placement.  No angina. COntinue aggressive secondary prevention.  Hyperlipidemia: LDL 75 in 2022.  Continue Zetia.  Statin intolerant.  Needs CMet and lipids.  Hypertension: The current medical regimen is effective;  continue present plan and medications. Elevated blood sugar: A1C 5.7 in 09/2020.  Recheck A1c. Memory disturbance: Wife also has memory problems.  He has had perceived side  effects with several medications in the past.  In general, he wants to try to stop medications.  He was planning on moving to [place] with his daughter at some point.  Tried a med for Alzheimer's disease but had some side effects.   Current medicines are reviewed at length with the  patient today.  The patient concerns regarding his medicines were addressed.  The following changes have been made:  No change  Labs/ tests ordered today include:  No orders of the defined types were placed in this encounter.   Recommend 150 minutes/week of aerobic exercise Low fat, low carb, high fiber diet recommended  Disposition:   FU in 1 year with Skains or Dr. Izora Ribas   Signed, Lance Muss, MD  11/01/2022 2:19 PM    [facility] [address] [place], [place], [place]  [address] Phone: [phone]; Fax: [phone]

## 2022-11-01 ENCOUNTER — Encounter: Payer: Self-pay | Admitting: Interventional Cardiology

## 2022-11-01 ENCOUNTER — Ambulatory Visit: Payer: Medicare Other | Attending: Interventional Cardiology | Admitting: Interventional Cardiology

## 2022-11-01 VITALS — BP 122/62 | HR 70 | Ht 71.0 in | Wt 199.0 lb

## 2022-11-01 DIAGNOSIS — I25119 Atherosclerotic heart disease of native coronary artery with unspecified angina pectoris: Secondary | ICD-10-CM | POA: Diagnosis present

## 2022-11-01 DIAGNOSIS — R413 Other amnesia: Secondary | ICD-10-CM | POA: Insufficient documentation

## 2022-11-01 DIAGNOSIS — Z8639 Personal history of other endocrine, nutritional and metabolic disease: Secondary | ICD-10-CM | POA: Diagnosis present

## 2022-11-01 DIAGNOSIS — I10 Essential (primary) hypertension: Secondary | ICD-10-CM | POA: Diagnosis present

## 2022-11-01 DIAGNOSIS — I252 Old myocardial infarction: Secondary | ICD-10-CM | POA: Diagnosis present

## 2022-11-01 DIAGNOSIS — E782 Mixed hyperlipidemia: Secondary | ICD-10-CM | POA: Insufficient documentation

## 2022-11-01 NOTE — Patient Instructions (Signed)
Medication Instructions:  Your physician recommends that you continue on your current medications as directed. Please refer to the Current Medication list given to you today.  *If you need a refill on your cardiac medications before your next appointment, please call your pharmacy*   Lab Work: In 1-3 weeks: fasting CBC, CMET, Lipids, hemoglobin A1c If you have labs (blood work) drawn today and your tests are completely normal, you will receive your results only by: MyChart Message (if you have MyChart) OR A paper copy in the mail If you have any lab test that is abnormal or we need to change your treatment, we will call you to review the results.  Testing/Procedures: None ordered today.  Follow-Up: At [facility], you and your health needs are our priority.  As part of our continuing mission to provide you with exceptional heart care, we have created designated Provider Care Teams.  These Care Teams include your primary Cardiologist (physician) and Advanced Practice Providers (APPs -  Physician Assistants and Nurse Practitioners) who all work together to provide you with the care you need, when you need it.  Your next appointment:   12 month(s)  The format for your next appointment:   In Person  Provider:   Riley Lam, MD or Donato Schultz, MD {

## 2022-11-09 ENCOUNTER — Encounter (HOSPITAL_COMMUNITY)
Admission: RE | Admit: 2022-11-09 | Discharge: 2022-11-09 | Disposition: A | Discharge status: Home / Self Care | Payer: Medicare Other | Source: Ambulatory Visit | Attending: Urology | Admitting: Urology

## 2022-11-09 DIAGNOSIS — C61 Malignant neoplasm of prostate: Secondary | ICD-10-CM | POA: Diagnosis present

## 2022-11-09 MED ORDER — FLOTUFOLASTAT F 18 GALLIUM 296-5846 MBQ/ML IV SOLN
8.4000 | Freq: Once | INTRAVENOUS | Status: AC
  Administered 2022-11-09: 8.4 via INTRAVENOUS

## 2022-11-10 ENCOUNTER — Other Ambulatory Visit: Payer: Self-pay | Admitting: Interventional Cardiology

## 2022-11-22 ENCOUNTER — Ambulatory Visit: Payer: Medicare Other | Attending: Interventional Cardiology

## 2022-11-22 DIAGNOSIS — Z8639 Personal history of other endocrine, nutritional and metabolic disease: Secondary | ICD-10-CM

## 2022-11-22 DIAGNOSIS — R413 Other amnesia: Secondary | ICD-10-CM

## 2022-11-22 DIAGNOSIS — I10 Essential (primary) hypertension: Secondary | ICD-10-CM

## 2022-11-22 DIAGNOSIS — I252 Old myocardial infarction: Secondary | ICD-10-CM

## 2022-11-22 DIAGNOSIS — E782 Mixed hyperlipidemia: Secondary | ICD-10-CM

## 2022-11-22 DIAGNOSIS — I25119 Atherosclerotic heart disease of native coronary artery with unspecified angina pectoris: Secondary | ICD-10-CM

## 2022-11-23 ENCOUNTER — Telehealth: Payer: Self-pay | Admitting: Interventional Cardiology

## 2022-11-23 LAB — LIPID PANEL
Chol/HDL Ratio: 3.2 ratio (ref 0.0–5.0)
Cholesterol, Total: 139 mg/dL (ref 100–199)
HDL: 44 mg/dL (ref >39)
LDL Chol Calc (NIH): 84 mg/dL (ref 0–99)
Triglycerides: 50 mg/dL (ref 0–149)
VLDL Cholesterol Cal: 11 mg/dL (ref 5–40)

## 2022-11-23 LAB — COMPREHENSIVE METABOLIC PANEL
ALT: 12 IU/L (ref 0–44)
AST: 12 IU/L (ref 0–40)
Albumin: 4.2 g/dL (ref 3.7–4.7)
Alkaline Phosphatase: 62 IU/L (ref 44–121)
BUN/Creatinine Ratio: 14 (ref 10–24)
BUN: 12 mg/dL (ref 8–27)
Bilirubin Total: 0.8 mg/dL (ref 0.0–1.2)
CO2: 23 mmol/L (ref 20–29)
Calcium: 9 mg/dL (ref 8.6–10.2)
Chloride: 109 mmol/L — ABNORMAL HIGH (ref 96–106)
Creatinine, Ser: 0.87 mg/dL (ref 0.76–1.27)
Globulin, Total: 2.3 g/dL (ref 1.5–4.5)
Glucose: 98 mg/dL (ref 70–99)
Potassium: 4.4 mmol/L (ref 3.5–5.2)
Sodium: 144 mmol/L (ref 134–144)
Total Protein: 6.5 g/dL (ref 6.0–8.5)
eGFR: 84 mL/min/{1.73_m2} (ref >59)

## 2022-11-23 LAB — CBC
Hematocrit: 46.2 % (ref 37.5–51.0)
Hemoglobin: 14.8 g/dL (ref 13.0–17.7)
MCH: 31.4 pg (ref 26.6–33.0)
MCHC: 32 g/dL (ref 31.5–35.7)
MCV: 98 fL — ABNORMAL HIGH (ref 79–97)
Platelets: 230 10*3/uL (ref 150–450)
RBC: 4.71 x10E6/uL (ref 4.14–5.80)
RDW: 11.9 % (ref 11.6–15.4)
WBC: 5.9 10*3/uL (ref 3.4–10.8)

## 2022-11-23 LAB — HEMOGLOBIN A1C
Est. average glucose Bld gHb Est-mCnc: 114 mg/dL
Hgb A1c MFr Bld: 5.6 % (ref 4.8–5.6)

## 2022-11-23 NOTE — Telephone Encounter (Signed)
-----   Message from [place] sent at 11/23/2022  2:42 PM EDT ----- A1c well-controlled.  LDL above target, mildly increased from prior.  He has had difficulty with statins.  C-Met and CBC well-controlled.  Continue current medicines as he has been hesitant to try other medications.  Would not push higher level lipid-lowering therapy at this time as he prefers to try to come off of medications.

## 2022-11-23 NOTE — Telephone Encounter (Signed)
Patient returning call regarding lab results  ?

## 2022-11-23 NOTE — Telephone Encounter (Signed)
I spoke with patient and reviewed results with him

## 2022-12-15 ENCOUNTER — Other Ambulatory Visit: Payer: Self-pay | Admitting: Interventional Cardiology

## 2023-10-09 ENCOUNTER — Telehealth: Payer: Self-pay | Admitting: Interventional Cardiology

## 2023-10-09 NOTE — Telephone Encounter (Signed)
 Patient came to first floor requesting after visit summary and medical records 10/09/2023 @ 11:31am. Spoke to [employer].

## 2023-11-11 ENCOUNTER — Other Ambulatory Visit: Payer: Self-pay

## 2023-11-12 MED ORDER — CLOPIDOGREL BISULFATE 75 MG PO TABS: 75.0000 mg | ORAL_TABLET | Freq: Every day | ORAL | 0 refills | Status: AC

## 2023-12-06 ENCOUNTER — Other Ambulatory Visit: Payer: Self-pay | Admitting: Nurse Practitioner

## 2023-12-06 MED ORDER — EZETIMIBE 10 MG PO TABS: 10.0000 mg | ORAL_TABLET | Freq: Every day | ORAL | 0 refills | Status: AC

## 2023-12-10 ENCOUNTER — Emergency Department (HOSPITAL_COMMUNITY)

## 2023-12-10 ENCOUNTER — Emergency Department (HOSPITAL_COMMUNITY)
Admission: EM | Admit: 2023-12-10 | Discharge: 2023-12-10 | Disposition: A | Discharge status: Home / Self Care | Attending: Emergency Medicine | Admitting: Emergency Medicine

## 2023-12-10 ENCOUNTER — Encounter (HOSPITAL_COMMUNITY): Payer: Self-pay

## 2023-12-10 ENCOUNTER — Other Ambulatory Visit: Payer: Self-pay

## 2023-12-10 DIAGNOSIS — I4891 Unspecified atrial fibrillation: Secondary | ICD-10-CM | POA: Diagnosis not present

## 2023-12-10 DIAGNOSIS — Z7901 Long term (current) use of anticoagulants: Secondary | ICD-10-CM | POA: Insufficient documentation

## 2023-12-10 DIAGNOSIS — R9431 Abnormal electrocardiogram [ECG] [EKG]: Secondary | ICD-10-CM | POA: Insufficient documentation

## 2023-12-10 DIAGNOSIS — R000 Tachycardia, unspecified: Secondary | ICD-10-CM | POA: Diagnosis present

## 2023-12-10 DIAGNOSIS — I2510 Atherosclerotic heart disease of native coronary artery without angina pectoris: Secondary | ICD-10-CM | POA: Insufficient documentation

## 2023-12-10 DIAGNOSIS — I10 Essential (primary) hypertension: Secondary | ICD-10-CM | POA: Insufficient documentation

## 2023-12-10 DIAGNOSIS — Z79899 Other long term (current) drug therapy: Secondary | ICD-10-CM | POA: Diagnosis not present

## 2023-12-10 LAB — BASIC METABOLIC PANEL WITH GFR
Anion gap: 11 (ref 5–15)
BUN: 16 mg/dL (ref 8–23)
CO2: 22 mmol/L (ref 22–32)
Calcium: 8.9 mg/dL (ref 8.9–10.3)
Chloride: 109 mmol/L (ref 98–111)
Creatinine, Ser: 0.94 mg/dL (ref 0.61–1.24)
GFR, Estimated: >60 mL/min (ref >60)
Glucose, Bld: 116 mg/dL — ABNORMAL HIGH (ref 70–99)
Potassium: 3.8 mmol/L (ref 3.5–5.1)
Sodium: 142 mmol/L (ref 135–145)

## 2023-12-10 LAB — CBC
HCT: 48.2 % (ref 39.0–52.0)
Hemoglobin: 16.1 g/dL (ref 13.0–17.0)
MCH: 31.6 pg (ref 26.0–34.0)
MCHC: 33.4 g/dL (ref 30.0–36.0)
MCV: 94.7 fL (ref 80.0–100.0)
Platelets: 232 K/uL (ref 150–400)
RBC: 5.09 MIL/uL (ref 4.22–5.81)
RDW: 12.4 % (ref 11.5–15.5)
WBC: 6.4 K/uL (ref 4.0–10.5)
nRBC: 0 % (ref 0.0–0.2)

## 2023-12-10 LAB — TROPONIN I (HIGH SENSITIVITY): Troponin I (High Sensitivity): 6 ng/L (ref <18)

## 2023-12-10 MED ORDER — METOPROLOL TARTRATE 25 MG PO TABS: 12.5000 mg | ORAL_TABLET | Freq: Once | ORAL | Status: DC

## 2023-12-10 NOTE — Discharge Instructions (Signed)
 You were seen for your atrial fibrillation in the emergency department.   At home, please continue your medications including the metoprolol  and Plavix .    Follow-up with your primary doctor in 2-3 days regarding your visit.  Cardiology will be calling you regarding an appointment within the next 72 hours.  You may contact them if you do not hear from them in that time using the information in this packet.  Please talk to them about initiating Eliquis.  Return immediately to the emergency department if you experience any of the following: Chest pain, shortness of breath, fainting, or any other concerning symptoms.    Thank you for visiting our Emergency Department. It was a pleasure taking care of you today.

## 2023-12-10 NOTE — ED Provider Notes (Signed)
 [place] EMERGENCY DEPARTMENT AT [facility] Provider Note   CSN: [id number] Arrival date & time: 12/10/23  1637     Patient presents with: Irregular Heart Beat and Hypertension   Peter [name] is a 89 y.o. male.  {Add pertinent medical, surgical, social history, OB history to HPI:1022} 89 year old male with a history of CAD status post PCI on Plavix , CVA without residual deficits, and atrial fibrillation on metoprolol  who presents to the emergency department with abnormal EKG.  Patient reports that he was going to an outpatient apartment today when they noticed that he was in atrial fibrillation with RVR.  Says he is not having chest pain or shortness of breath.  They changed him from his valsartan  to metoprolol  a month ago and says he has been feeling weaker since then.  Says he is asymptomatic at this time       Prior to Admission medications   Medication Sig Start Date End Date Taking? Authorizing Provider  acetaminophen  (TYLENOL ) 650 MG CR tablet Take 650 mg by mouth every 8 (eight) hours as needed for pain.    [provider]  alfuzosin (UROXATRAL) 10 MG 24 hr tablet Take 10 mg by mouth daily with breakfast.    [provider]  clopidogrel  (PLAVIX ) 75 MG tablet Take 1 tablet (75 mg total) by mouth daily. 11/12/23   Meng, Hao, PA  donepezil (ARICEPT) 10 MG tablet Take 10 mg by mouth at bedtime.    [provider]  ezetimibe  (ZETIA ) 10 MG tablet Take 1 tablet (10 mg total) by mouth daily. 12/06/23   Meng, Hao, PA  Fluocinolone Acetonide 0.01 % OIL Place 3 drops in ear(s) 2 (two) times daily as needed (Itching and fluid).  11/17/15   [provider]  fluticasone  (FLONASE ) 50 MCG/ACT nasal spray Place 1 spray into the nose daily as needed for allergies.  01/28/13   [provider]  hydrocortisone 2.5 % ointment APPLY TO AFFECTED AREAS EVERY DAY AS NEEDED 09/28/21   [provider]  nitroGLYCERIN  (NITROSTAT ) 0.4 MG SL  tablet Place 1 tablet (0.4 mg total) under the tongue every 5 (five) minutes x 3 doses as needed for chest pain. 06/15/22   [name], MD  Polyethyl Glycol-Propyl Glycol (SYSTANE) 0.4-0.3 % GEL ophthalmic gel Place 1 application into both eyes 2 (two) times daily as needed.    [provider]  polyethylene glycol (MIRALAX  / GLYCOLAX ) 17 g packet Take by mouth.    [provider]  triamcinolone cream (KENALOG) 0.1 % Apply topically 2 (two) times daily as needed. 03/28/21   [provider]  valsartan  (DIOVAN ) 40 MG tablet Take 1 tablet (40 mg total) by mouth daily. 09/13/15   [name], MD    Allergies: Bactrim [sulfamethoxazole-trimethoprim], Cardura  [doxazosin  mesylate], Doxycycline, Flomax [tamsulosin hcl], Levaquin [levofloxacin in d5w], Lipitor [atorvastatin], Penicillins, Sulfa antibiotics, Vytorin [ezetimibe -simvastatin], Lisinopril, Niaspan [niacin], Clindamycin/lincomycin, and Zithromax [azithromycin]    Review of Systems  Updated Vital Signs BP (!) 168/104 (BP Location: Left Arm)   Pulse 76   Temp 98 F (36.7 C)   Resp 18   SpO2 97%   Physical Exam Vitals and nursing note reviewed.  Constitutional:      General: He is not in acute distress.    Appearance: He is well-developed.  HENT:     Head: Normocephalic and atraumatic.     Right Ear: External ear normal.     Left Ear: External ear normal.  Nose: Nose normal.  Eyes:     Extraocular Movements: Extraocular movements intact.     Conjunctiva/sclera: Conjunctivae normal.     Pupils: Pupils are equal, round, and reactive to light.  Cardiovascular:     Rate and Rhythm: Normal rate. Rhythm irregular.     Heart sounds: Normal heart sounds.  Pulmonary:     Effort: Pulmonary effort is normal. No respiratory distress.     Breath sounds: Normal breath sounds.  Musculoskeletal:     Cervical back: Normal range of motion and neck supple.     Right lower leg: No edema.     Left lower leg: No  edema.  Skin:    General: Skin is warm and dry.  Neurological:     Mental Status: He is alert. Mental status is at baseline.  Psychiatric:        Mood and Affect: Mood normal.        Behavior: Behavior normal.     (all labs ordered are listed, but only abnormal results are displayed) Labs Reviewed  CBC  BASIC METABOLIC PANEL WITH GFR  TROPONIN I (HIGH SENSITIVITY)    EKG: EKG Interpretation Date/Time:  Tuesday December 10 2023 16:50:30 EDT Ventricular Rate:  109 PR Interval:    QRS Duration:  70 QT Interval:  312 QTC Calculation: 420 R Axis:   -5  Text Interpretation: Atrial fibrillation with rapid ventricular response Anterior infarct , age undetermined Abnormal ECG When compared with ECG of 19-Aug-2019 21:23, PREVIOUS ECG IS PRESENT Confirmed by Yolande Charleston [phone]) on 12/10/2023 6:00:19 PM  Radiology: No results found.  {Document cardiac monitor, telemetry assessment procedure when appropriate:32947} Procedures   Medications Ordered in the ED - No data to display    {Click here for ABCD2, HEART and other calculators REFRESH Note before signing:1}                              Medical Decision Making Amount and/or Complexity of Data Reviewed Labs: ordered. Radiology: ordered.   ***  {Document critical care time when appropriate  Document review of labs and clinical decision tools ie CHADS2VASC2, etc  Document your independent review of radiology images and any outside records  Document your discussion with family members, caretakers and with consultants  Document social determinants of health affecting pt's care  Document your decision making why or why not admission, treatments were needed:32947:::1}   Final diagnoses:  None    ED Discharge Orders     None

## 2023-12-10 NOTE — ED Notes (Signed)
 ED Provider at bedside.

## 2023-12-10 NOTE — ED Triage Notes (Signed)
 Pt denies chest pain, c.o sob but no more than his usual. Pt HR 109 in triage

## 2023-12-10 NOTE — ED Triage Notes (Signed)
 Pt to ER states he was at PCP for medication check and was told he was in Afib RVR and his blood pressure was out of control.

## 2023-12-18 ENCOUNTER — Ambulatory Visit (HOSPITAL_COMMUNITY): Attending: Internal Medicine | Admitting: Internal Medicine

## 2023-12-18 ENCOUNTER — Encounter (HOSPITAL_COMMUNITY): Payer: Self-pay

## 2023-12-18 NOTE — Progress Notes (Incomplete)
 Primary Care Physician: Jacques Camie Pepper, PA-C Primary Cardiologist: Candyce Reek, MD Electrophysiologist: None  {Click to update primary MD,subspecialty MD or APP then REFRESH:1}   Referring Physician: ED  {Removed [facility], PMH, PSH, ALLERGY, CMED, and SOC :1}   Peter [name] is a 89 y.o. male with a history of HTN, prostate cancer, CVA, CAD, HLD, atrial fibrillation who presents for consultation in the [facility].  ED visit on 10/7 due to Afib with RVR noted at earlier urgent care appointment. Patient declined OAC for stroke prevention.  On evaluation today, patient is currently in ***.   Today, he denies symptoms of ***palpitations, chest pain, shortness of breath, orthopnea, PND, lower extremity edema, dizziness, presyncope, syncope, bleeding, or neurologic sequela. The patient is tolerating medications without difficulties and is otherwise without complaint today.    Atrial Fibrillation Risk Factors:  he {Action; does/does not:19097} have symptoms or diagnosis of sleep apnea. he {ACTION; IS/IS [id number]} compliant with CPAP therapy. he {Action; does/does not:19097} have a history of rheumatic fever. he {Action; does/does not:19097} have a history of alcohol  use. The patient {Action; does/does not:19097} have a history of early familial atrial fibrillation or other arrhythmias.  he has a BMI of There is no height or weight on file to calculate BMI.. There were no vitals filed for this visit.  Current Outpatient Medications  Medication Sig Dispense Refill   acetaminophen  (TYLENOL ) 650 MG CR tablet Take 650 mg by mouth every 8 (eight) hours as needed for pain.     alfuzosin (UROXATRAL) 10 MG 24 hr tablet Take 10 mg by mouth daily with breakfast.     clopidogrel  (PLAVIX ) 75 MG tablet Take 1 tablet (75 mg total) by mouth daily. 30 tablet 0   donepezil (ARICEPT) 10 MG tablet Take 10 mg by mouth at bedtime.     ezetimibe  (ZETIA ) 10 MG  tablet Take 1 tablet (10 mg total) by mouth daily. 15 tablet 0   Fluocinolone Acetonide 0.01 % OIL Place 3 drops in ear(s) 2 (two) times daily as needed (Itching and fluid).      fluticasone  (FLONASE ) 50 MCG/ACT nasal spray Place 1 spray into the nose daily as needed for allergies.      hydrocortisone 2.5 % ointment APPLY TO AFFECTED AREAS EVERY DAY AS NEEDED     nitroGLYCERIN  (NITROSTAT ) 0.4 MG SL tablet Place 1 tablet (0.4 mg total) under the tongue every 5 (five) minutes x 3 doses as needed for chest pain. 25 tablet 3   Polyethyl Glycol-Propyl Glycol (SYSTANE) 0.4-0.3 % GEL ophthalmic gel Place 1 application into both eyes 2 (two) times daily as needed.     polyethylene glycol (MIRALAX  / GLYCOLAX ) 17 g packet Take by mouth.     triamcinolone cream (KENALOG) 0.1 % Apply topically 2 (two) times daily as needed.     valsartan  (DIOVAN ) 40 MG tablet Take 1 tablet (40 mg total) by mouth daily. 30 tablet 0   No current facility-administered medications for this visit.    Atrial Fibrillation Management history:  Previous antiarrhythmic drugs: none Previous cardioversions: none Previous ablations: none Anticoagulation history: none   ROS- All systems are reviewed and negative except as per the HPI above.  Physical Exam: There were no vitals taken for this visit.  GEN: Well nourished, well developed in no acute distress NECK: No JVD; No carotid bruits CARDIAC: {EPRHYTHM:28826}, no murmurs, rubs, gallops RESPIRATORY:  Clear to auscultation without rales, wheezing or rhonchi  ABDOMEN: Soft,  non-tender, non-distended EXTREMITIES:  No edema; No deformity   EKG today demonstrates ***  Echo 09/10/15 demonstrated  Study Conclusions   - Left ventricle: The cavity size was normal. Systolic function was    normal. The estimated ejection fraction was in the range of 55%    to 60%. Wall motion was normal; there were no regional wall    motion abnormalities. There was an increased relative     contribution of atrial contraction to ventricular filling.    Doppler parameters are consistent with abnormal left ventricular    relaxation (grade 1 diastolic dysfunction).  - Aortic valve: There was mild regurgitation.  - Mitral valve: There was trivial regurgitation.   ASSESSMENT & PLAN CHA2DS2-VASc Score =    The patient's score is based upon:   {Click here to calculate score.  REFRESH note before signing. :1}    Chads HTN, CAD, CVA 6   ASSESSMENT AND PLAN: {Select the correct AFib Diagnosis                 :[id number]} Persistent if still in it    Follow up ***   Terra Pac, [facility] [address] [place], [place] [address] [phone]
